# Patient Record
Sex: Male | Born: 1960 | Race: White | Hispanic: No | Marital: Married | Smoking: Former smoker
Health system: Southern US, Community
[De-identification: ages and names within clinical notes are randomized; demographics above are authoritative.]

## PROBLEM LIST (undated history)

## (undated) DIAGNOSIS — J189 Pneumonia, unspecified organism: Secondary | ICD-10-CM

## (undated) DIAGNOSIS — B977 Papillomavirus as the cause of diseases classified elsewhere: Secondary | ICD-10-CM

## (undated) DIAGNOSIS — K219 Gastro-esophageal reflux disease without esophagitis: Secondary | ICD-10-CM

## (undated) DIAGNOSIS — Z9221 Personal history of antineoplastic chemotherapy: Secondary | ICD-10-CM

## (undated) DIAGNOSIS — I341 Nonrheumatic mitral (valve) prolapse: Secondary | ICD-10-CM

## (undated) DIAGNOSIS — J45909 Unspecified asthma, uncomplicated: Secondary | ICD-10-CM

## (undated) DIAGNOSIS — R918 Other nonspecific abnormal finding of lung field: Secondary | ICD-10-CM

## (undated) DIAGNOSIS — Z923 Personal history of irradiation: Secondary | ICD-10-CM

## (undated) DIAGNOSIS — C801 Malignant (primary) neoplasm, unspecified: Secondary | ICD-10-CM

## (undated) DIAGNOSIS — K089 Disorder of teeth and supporting structures, unspecified: Secondary | ICD-10-CM

## (undated) HISTORY — DX: Other nonspecific abnormal finding of lung field: R91.8

## (undated) HISTORY — DX: Personal history of antineoplastic chemotherapy: Z92.21

## (undated) HISTORY — DX: Pneumonia, unspecified organism: J18.9

## (undated) HISTORY — DX: Personal history of irradiation: Z92.3

## (undated) HISTORY — PX: TONSILLECTOMY: SUR1361

## (undated) HISTORY — PX: APPENDECTOMY: SHX54

---

## 2000-12-14 ENCOUNTER — Encounter: Payer: Self-pay | Admitting: Emergency Medicine

## 2000-12-14 ENCOUNTER — Emergency Department (HOSPITAL_COMMUNITY): Admission: EM | Admit: 2000-12-14 | Discharge: 2000-12-14 | Payer: Self-pay | Admitting: Emergency Medicine

## 2002-10-08 ENCOUNTER — Emergency Department (HOSPITAL_COMMUNITY): Admission: EM | Admit: 2002-10-08 | Discharge: 2002-10-08 | Payer: Self-pay | Admitting: Emergency Medicine

## 2003-08-09 ENCOUNTER — Emergency Department (HOSPITAL_COMMUNITY): Admission: EM | Admit: 2003-08-09 | Discharge: 2003-08-09 | Payer: Self-pay | Admitting: Emergency Medicine

## 2005-01-31 ENCOUNTER — Emergency Department (HOSPITAL_COMMUNITY): Admission: EM | Admit: 2005-01-31 | Discharge: 2005-01-31 | Payer: Self-pay | Admitting: Emergency Medicine

## 2005-02-23 ENCOUNTER — Emergency Department (HOSPITAL_COMMUNITY): Admission: EM | Admit: 2005-02-23 | Discharge: 2005-02-23 | Payer: Self-pay | Admitting: Emergency Medicine

## 2008-09-20 ENCOUNTER — Emergency Department (HOSPITAL_COMMUNITY): Admission: EM | Admit: 2008-09-20 | Discharge: 2008-09-20 | Payer: Self-pay | Admitting: Family Medicine

## 2010-06-17 LAB — POCT I-STAT, CHEM 8
BUN: 9 mg/dL (ref 6–23)
Calcium, Ion: 1.25 mmol/L (ref 1.12–1.32)
Chloride: 103 mEq/L (ref 96–112)
Creatinine, Ser: 1.2 mg/dL (ref 0.4–1.5)
Glucose, Bld: 83 mg/dL (ref 70–99)
HCT: 51 % (ref 39.0–52.0)
Hemoglobin: 17.3 g/dL — ABNORMAL HIGH (ref 13.0–17.0)
Potassium: 4.4 mEq/L (ref 3.5–5.1)
Sodium: 139 mEq/L (ref 135–145)
TCO2: 27 mmol/L (ref 0–100)

## 2011-03-12 DIAGNOSIS — C801 Malignant (primary) neoplasm, unspecified: Secondary | ICD-10-CM

## 2011-03-12 HISTORY — DX: Malignant (primary) neoplasm, unspecified: C80.1

## 2011-09-23 ENCOUNTER — Encounter: Payer: Self-pay | Admitting: *Deleted

## 2011-09-23 ENCOUNTER — Telehealth: Payer: Self-pay | Admitting: *Deleted

## 2011-09-23 NOTE — Progress Notes (Signed)
Called referring office for scans and last notes to help with placement for appt

## 2011-09-23 NOTE — Telephone Encounter (Signed)
Spoke with pt and wife regarding appt 10/04/11 at 3:00 with Dr. Bur[facility].  Wife verbalized understanding of appt time and place

## 2011-10-01 ENCOUNTER — Telehealth: Payer: Self-pay | Admitting: *Deleted

## 2011-10-01 NOTE — Telephone Encounter (Signed)
Message copied by Tommie Sams on Tue Oct 01, 2011  4:32 PM ------      Message from: Lonia Farber      Created: Mon Sep 30, 2011 11:26 PM      Regarding: Records       I am seeing Mr. Saxer this Friday.  Could not find any imaging in Epic.  Can you please make sure I have his images from outside ASAP.  He can bring the disk for his appointment.  If images are not available he should be rescheduled whenever we can get them.  Thanks. KZ

## 2011-10-01 NOTE — Telephone Encounter (Signed)
I called referring physician and was advised pt had CT chest w/ contrast at [facility] on 09/20/11. I called [facility] and they are going to put pt CT chest on a disc and mail this over to our office prior to pt appt on Friday. Will forward to Dr. Herma Carson so he aware

## 2011-10-03 ENCOUNTER — Encounter: Payer: Self-pay | Admitting: Pulmonary Disease

## 2011-10-04 ENCOUNTER — Ambulatory Visit (INDEPENDENT_AMBULATORY_CARE_PROVIDER_SITE_OTHER): Payer: BC Managed Care – PPO | Admitting: Pulmonary Disease

## 2011-10-04 ENCOUNTER — Encounter: Payer: Self-pay | Admitting: Pulmonary Disease

## 2011-10-04 VITALS — BP 108/84 | HR 81 | Temp 98.1°F | Ht 70.0 in | Wt 220.4 lb

## 2011-10-04 DIAGNOSIS — R918 Other nonspecific abnormal finding of lung field: Secondary | ICD-10-CM

## 2011-10-04 DIAGNOSIS — R222 Localized swelling, mass and lump, trunk: Secondary | ICD-10-CM

## 2011-10-04 MED ORDER — BENZONATATE 200 MG PO CAPS: 200.0000 mg | ORAL_CAPSULE | Freq: Three times a day (TID) | ORAL | Status: AC | PRN

## 2011-10-04 MED ORDER — OXYCODONE-ACETAMINOPHEN 10-325 MG PO TABS: 1.0000 | ORAL_TABLET | Freq: Four times a day (QID) | ORAL | Status: AC | PRN

## 2011-10-04 NOTE — Patient Instructions (Addendum)
Bronchoscopy / EBUS (already scheduled) PET/CT scan Benzonatate refill Percocet refill

## 2011-10-04 NOTE — Progress Notes (Signed)
Name: Roger Mckinney MRN: [id number] DOB: 06/07/1960  Referring Physician:  Kimberly Millsaps, NP Reason for Consultation:  Lung mass   INTERVENTIONAL PULMONOLOGY CONSULTATION NOTE   History of Present Illness: 51 year old active smoker with history of rib fractures after a paroxysm of cough several month ago.  Another rib fracture after a paroxysm of cough about one week ago same.  Imaging revealed lung mass.  Reports chronic nonproductive cough.  There is no hemoptysis.  Lost 12 lb in since few month ago. Reports severe chest wall pain related to rib fractures.  Past Medical History  Diagnosis Date  . PNA (pneumonia)   . Lung mass    No past surgical history on file.  Prior to Admission medications   Medication Sig Start Date End Date Taking? Authorizing Provider  benzonatate (TESSALON) 200 MG capsule Take 200 mg by mouth 3 (three) times daily as needed.    Historical Provider, MD  Oxycodone HCl 10 MG TABS Take 10 mg by mouth every 6 (six) hours as needed.    Historical Provider, MD   Allergies Allergies  Allergen Reactions  . 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 Positive for weight loss. HENT: Negative for hearing loss, ear pain, nosebleeds, congestion, sore throat, neck pain, tinnitus and ear discharge.   Eyes: Negative for blurred vision, double vision, photophobia, pain, discharge and redness.  Respiratory: Negative for hemoptysis, sputum production, shortness of breath, wheezing and stridor.  Positive for nonproductive cough. Cardiovascular: Negative for chest pain, palpitations, orthopnea, claudication, leg swelling and PND.    Gastrointestinal: Negative for heartburn, nausea, vomiting, abdominal pain, diarrhea, constipation, blood in stool and melena.  Genitourinary: Negative for dysuria, urgency, frequency, hematuria and flank pain.  Musculoskeletal: Negative for myalgias, back pain, joint pain and falls. Skin: Negative for itching and rash. Positive for chest wall pain. Neurological: Negative for dizziness, tingling, tremors, sensory change, speech change, focal weakness, seizures, loss of consciousness, weakness and headaches.  Endo/Heme/Allergies: Negative for environmental allergies and polydipsia. Does not bruise/bleed easily.   Vital Signs: There were no vitals filed for this visit.  Physical Examination: General:  No acute distress, anxious Neuro:  Alert, oriented, nonfocal HEENT:  Pink conjunctivae, moist mucus membranes, poor dentition Neck:  No lymphadenopathy Cardiovascular:  RRR, no murmurs Lungs:  Bilateral air entry, no wheezes, rhonchi, rales. Tenderness to chest wall palpation. Abdomen:  Soft, nontender, nondistended Musculoskeletal:  No clubbing, cyanosis or edema. Digital clubbing. Skin: No rash   Labs: No results found for this basename: HGB:3,HCT:3,WBC:3,PLT:3,NA:5,K:2,CL:5,CO2:5,GLUCOSE:5,BUN:5,CREATININE:5,CALCIUM:5,MG:5,PHOS:5,INR:5,APTT:5 in the last 168 hours  Chest CT(images reviewed):  09/20/2011 (outside) >>> Large left perihilar mass.  Small mediastinal lymph nodes. Babasilar opacification which may represent infiltrates / atelectasis.  Posterior right 8th rib fracture.   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 Dwana Garin, M.D., F.C.C.P. Interventional Pulmonology [facility] Cell: [phone] Pager: [phone]  10/04/2011, 1:44 PM   

## 2011-10-07 ENCOUNTER — Encounter (HOSPITAL_COMMUNITY): Payer: Self-pay | Admitting: Respiratory Therapy

## 2011-10-08 ENCOUNTER — Encounter (HOSPITAL_COMMUNITY): Payer: Self-pay

## 2011-10-08 ENCOUNTER — Encounter (HOSPITAL_COMMUNITY)
Admission: RE | Admit: 2011-10-08 | Discharge: 2011-10-08 | Disposition: A | Discharge status: Home / Self Care | Payer: BC Managed Care – PPO | Source: Ambulatory Visit | Attending: Pulmonary Disease | Admitting: Pulmonary Disease

## 2011-10-08 HISTORY — DX: Gastro-esophageal reflux disease without esophagitis: K21.9

## 2011-10-08 HISTORY — DX: Unspecified asthma, uncomplicated: J45.909

## 2011-10-08 HISTORY — DX: Nonrheumatic mitral (valve) prolapse: I34.1

## 2011-10-08 HISTORY — DX: Papillomavirus as the cause of diseases classified elsewhere: B97.7

## 2011-10-08 LAB — CBC
HCT: 44.3 % (ref 39.0–52.0)
MCHC: 33.9 g/dL (ref 30.0–36.0)
MCV: 89 fL (ref 78.0–100.0)
Platelets: 234 10*3/uL (ref 150–400)
RDW: 14.1 % (ref 11.5–15.5)
WBC: 9.7 10*3/uL (ref 4.0–10.5)

## 2011-10-08 LAB — COMPREHENSIVE METABOLIC PANEL
AST: 16 U/L (ref 0–37)
Albumin: 3.6 g/dL (ref 3.5–5.2)
BUN: 12 mg/dL (ref 6–23)
Calcium: 9.7 mg/dL (ref 8.4–10.5)
Chloride: 101 mEq/L (ref 96–112)
Creatinine, Ser: 1 mg/dL (ref 0.50–1.35)
Total Protein: 7.6 g/dL (ref 6.0–8.3)

## 2011-10-08 LAB — PROTIME-INR
INR: 1.01 (ref 0.00–1.49)
Prothrombin Time: 13.5 seconds (ref 11.6–15.2)

## 2011-10-08 LAB — SURGICAL PCR SCREEN
MRSA, PCR: NEGATIVE
Staphylococcus aureus: NEGATIVE

## 2011-10-08 NOTE — Pre-Procedure Instructions (Signed)
20 Roger Mckinney  10/08/2011   Your procedure is scheduled on:  Wednesday October 09, 2011  Report to [facility] at 10:30 AM.  Call this number if you have problems the morning of surgery: [phone]   Remember:   Do not eat food or drink :After Midnight.      Take these medicines the morning of surgery with A SIP OF WATER: tessalon, percocet   Do not wear jewelry, make-up or nail polish.  Do not wear lotions, powders, or perfumes. You may wear deodorant.  Do not shave 48 hours prior to surgery. Men may shave face and neck.  Do not bring valuables to the hospital.  Contacts, dentures or bridgework may not be worn into surgery.  Leave suitcase in the car. After surgery it may be brought to your room.  For patients admitted to the hospital, checkout time is 11:00 AM the day of discharge.   Patients discharged the day of surgery will not be allowed to drive home.  Name and phone number of your driver: family / friend  Special Instructions: CHG Shower Use Special Wash: 1/2 bottle night before surgery and 1/2 bottle morning of surgery.   Please read over the following fact sheets that you were given: Pain Booklet, Coughing and Deep Breathing, MRSA Information and Surgical Site Infection Prevention

## 2011-10-08 NOTE — Pre-Procedure Instructions (Deleted)
20 Roger Mckinney  10/08/2011   Your procedure is scheduled on:  Wednesday October 09, 2011  Report to [facility] at 11:00 AM.  Call this number if you have problems the morning of surgery: [phone]   Remember:   Do not eat food or drink :After Midnight.      Take these medicines the morning of surgery with A SIP OF WATER: percocet   Do not wear jewelry, make-up or nail polish.  Do not wear lotions, powders, or perfumes. You may wear deodorant.  Do not shave 48 hours prior to surgery. Men may shave face and neck.  Do not bring valuables to the hospital.  Contacts, dentures or bridgework may not be worn into surgery.  Leave suitcase in the car. After surgery it may be brought to your room.  For patients admitted to the hospital, checkout time is 11:00 AM the day of discharge.   Patients discharged the day of surgery will not be allowed to drive home.  Name and phone number of your driver:  Family/ friend  Special Instructions: CHG Shower Use Special Wash: 1/2 bottle night before surgery and 1/2 bottle morning of surgery.   Please read over the following fact sheets that you were given: Pain Booklet, Coughing and Deep Breathing, MRSA Information and Surgical Site Infection Prevention

## 2011-10-09 ENCOUNTER — Encounter (HOSPITAL_COMMUNITY): Payer: Self-pay | Admitting: Certified Registered Nurse Anesthetist

## 2011-10-09 ENCOUNTER — Ambulatory Visit (HOSPITAL_COMMUNITY): Payer: BC Managed Care – PPO | Admitting: Certified Registered Nurse Anesthetist

## 2011-10-09 ENCOUNTER — Ambulatory Visit (HOSPITAL_COMMUNITY)
Admission: RE | Admit: 2011-10-09 | Discharge: 2011-10-09 | Disposition: A | Discharge status: Home / Self Care | Payer: BC Managed Care – PPO | Source: Ambulatory Visit | Attending: Pulmonary Disease | Admitting: Pulmonary Disease

## 2011-10-09 ENCOUNTER — Encounter (HOSPITAL_COMMUNITY)
Admission: RE | Disposition: A | Discharge status: Home / Self Care | Payer: Self-pay | Source: Ambulatory Visit | Attending: Pulmonary Disease

## 2011-10-09 DIAGNOSIS — C3490 Malignant neoplasm of unspecified part of unspecified bronchus or lung: Secondary | ICD-10-CM | POA: Insufficient documentation

## 2011-10-09 DIAGNOSIS — R599 Enlarged lymph nodes, unspecified: Secondary | ICD-10-CM | POA: Insufficient documentation

## 2011-10-09 DIAGNOSIS — R918 Other nonspecific abnormal finding of lung field: Secondary | ICD-10-CM

## 2011-10-09 DIAGNOSIS — F17200 Nicotine dependence, unspecified, uncomplicated: Secondary | ICD-10-CM | POA: Insufficient documentation

## 2011-10-09 DIAGNOSIS — R222 Localized swelling, mass and lump, trunk: Secondary | ICD-10-CM

## 2011-10-09 DIAGNOSIS — R590 Localized enlarged lymph nodes: Secondary | ICD-10-CM

## 2011-10-09 SURGERY — BRONCHOSCOPY, WITH EBUS
Anesthesia: General

## 2011-10-09 MED ORDER — ONDANSETRON HCL 4 MG/2ML IJ SOLN
INTRAMUSCULAR | Status: DC | PRN
  Administered 2011-10-09: 4 mg via INTRAVENOUS

## 2011-10-09 MED ORDER — MIDAZOLAM HCL 5 MG/5ML IJ SOLN
INTRAMUSCULAR | Status: DC | PRN
  Administered 2011-10-09: 2 mg via INTRAVENOUS

## 2011-10-09 MED ORDER — GLYCOPYRROLATE 0.2 MG/ML IJ SOLN
INTRAMUSCULAR | Status: DC | PRN
  Administered 2011-10-09: .8 mg via INTRAVENOUS

## 2011-10-09 MED ORDER — 0.9 % SODIUM CHLORIDE (POUR BTL) OPTIME
TOPICAL | Status: DC | PRN
  Administered 2011-10-09: 1000 mL

## 2011-10-09 MED ORDER — FENTANYL CITRATE 0.05 MG/ML IJ SOLN
INTRAMUSCULAR | Status: DC | PRN
  Administered 2011-10-09 (×4): 50 ug via INTRAVENOUS

## 2011-10-09 MED ORDER — LACTATED RINGERS IV SOLN
INTRAVENOUS | Status: DC | PRN
  Administered 2011-10-09 (×2): via INTRAVENOUS

## 2011-10-09 MED ORDER — LIDOCAINE HCL (CARDIAC) 20 MG/ML IV SOLN
INTRAVENOUS | Status: DC | PRN
  Administered 2011-10-09: 80 mg via INTRAVENOUS

## 2011-10-09 MED ORDER — PROPOFOL 10 MG/ML IV EMUL
INTRAVENOUS | Status: DC | PRN
  Administered 2011-10-09: 50 mg via INTRAVENOUS
  Administered 2011-10-09: 250 mg via INTRAVENOUS

## 2011-10-09 MED ORDER — LACTATED RINGERS IV SOLN
INTRAVENOUS | Status: DC
  Administered 2011-10-09: 12:00:00 via INTRAVENOUS

## 2011-10-09 MED ORDER — ROCURONIUM BROMIDE 100 MG/10ML IV SOLN
INTRAVENOUS | Status: DC | PRN
  Administered 2011-10-09: 40 mg via INTRAVENOUS

## 2011-10-09 MED ORDER — LIDOCAINE HCL 4 % MT SOLN
OROMUCOSAL | Status: DC | PRN
  Administered 2011-10-09: 4 mL via TOPICAL

## 2011-10-09 MED ORDER — NEOSTIGMINE METHYLSULFATE 1 MG/ML IJ SOLN
INTRAMUSCULAR | Status: DC | PRN
  Administered 2011-10-09: 5 mg via INTRAVENOUS

## 2011-10-09 SURGICAL SUPPLY — 25 items
BRUSH CYTOL CELLEBRITY 1.5X140 (MISCELLANEOUS) IMPLANT
CANISTER SUCTION 2500CC (MISCELLANEOUS) ×2 IMPLANT
CLOTH BEACON ORANGE TIMEOUT ST (SAFETY) ×2 IMPLANT
CONT SPEC 4OZ CLIKSEAL STRL BL (MISCELLANEOUS) ×2 IMPLANT
COVER TABLE BACK 60X90 (DRAPES) ×2 IMPLANT
FORCEPS BIOP RJ4 1.8 (CUTTING FORCEPS) IMPLANT
GLOVE BIOGEL PI IND STRL 6.5 (GLOVE) IMPLANT
GLOVE BIOGEL PI INDICATOR 6.5 (GLOVE) ×1
GLOVE SURG SIGNA 7.5 PF LTX (GLOVE) ×2 IMPLANT
KIT ROOM TURNOVER OR (KITS) ×2 IMPLANT
MARKER SKIN DUAL TIP RULER LAB (MISCELLANEOUS) ×2 IMPLANT
NDL BIOPSY TRANSBRONCH 21G (NEEDLE) IMPLANT
NEEDLE BIOPSY TRANSBRONCH 21G (NEEDLE) IMPLANT
NEEDLE SYS SONOTIP II EBUSTBNA (NEEDLE) ×2 IMPLANT
NS IRRIG 1000ML POUR BTL (IV SOLUTION) ×2 IMPLANT
OIL SILICONE PENTAX (PARTS (SERVICE/REPAIRS)) IMPLANT
PAD ARMBOARD 7.5X6 YLW CONV (MISCELLANEOUS) ×4 IMPLANT
SPONGE GAUZE 4X4 12PLY (GAUZE/BANDAGES/DRESSINGS) IMPLANT
SYR 20CC LL (SYRINGE) ×2 IMPLANT
SYR 20ML ECCENTRIC (SYRINGE) ×4 IMPLANT
SYR 5ML LUER SLIP (SYRINGE) ×4 IMPLANT
SYR TOOMEY 50ML (SYRINGE) ×4 IMPLANT
TOWEL OR 17X24 6PK STRL BLUE (TOWEL DISPOSABLE) ×2 IMPLANT
TRAP SPECIMEN MUCOUS 40CC (MISCELLANEOUS) ×2 IMPLANT
TUBE CONNECTING 12X1/4 (SUCTIONS) ×2 IMPLANT

## 2011-10-09 NOTE — Anesthesia Postprocedure Evaluation (Signed)
  Anesthesia Post-op Note  Patient: Roger Mckinney  Procedure(s) Performed: Procedure(s) (LRB): VIDEO BRONCHOSCOPY WITH ENDOBRONCHIAL ULTRASOUND (N/A)  Patient Location: PACU  Anesthesia Type: General  Level of Consciousness: awake  Airway and Oxygen Therapy: Patient Spontanous Breathing  Post-op Pain: mild  Post-op Assessment: Post-op Vital signs reviewed  Post-op Vital Signs: Reviewed  Complications: No apparent anesthesia complications

## 2011-10-09 NOTE — Preoperative (Signed)
Beta Blockers   Reason not to administer Beta Blockers:Not Applicable 

## 2011-10-09 NOTE — Interval H&P Note (Signed)
Records reviewed.  Patient examined.  No changes since last seen.  Procedure explained.  Questions answered.  Consent obtained.

## 2011-10-09 NOTE — H&P (View-Only) (Signed)
Name: Roger Mckinney MRN: [id number] DOB: May 15, 1960  Referring Physician:  Marva Panda, NP Reason for Consultation:  Lung mass   INTERVENTIONAL PULMONOLOGY CONSULTATION NOTE   History of Present Illness: 51 year old active smoker with history of rib fractures after a paroxysm of cough several month ago.  Another rib fracture after a paroxysm of cough about one week ago same.  Imaging revealed lung mass.  Reports chronic nonproductive cough.  There is no hemoptysis.  Lost 12 lb in since few month ago. Reports severe chest wall pain related to rib fractures.  Past Medical History  Diagnosis Date  . PNA (pneumonia)   . Lung mass    No past surgical history on file.  Prior to Admission medications   Medication Sig Start Date End Date Taking? Authorizing Provider  benzonatate (TESSALON) 200 MG capsule Take 200 mg by mouth 3 (three) times daily as needed.    Historical Provider, MD  Oxycodone HCl 10 MG TABS Take 10 mg by mouth every 6 (six) hours as needed.    Historical Provider, MD   Allergies Allergies  Allergen Reactions  . 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 Positive for weight loss. HENT: Negative for hearing loss, ear pain, nosebleeds, congestion, sore throat, neck pain, tinnitus and ear discharge.   Eyes: Negative for blurred vision, double vision, photophobia, pain, discharge and redness.  Respiratory: Negative for hemoptysis, sputum production, shortness of breath, wheezing and stridor.  Positive for nonproductive cough. Cardiovascular: Negative for chest pain, palpitations, orthopnea, claudication, leg swelling and PND.    Gastrointestinal: Negative for heartburn, nausea, vomiting, abdominal pain, diarrhea, constipation, blood in stool and melena.  Genitourinary: Negative for dysuria, urgency, frequency, hematuria and flank pain.  Musculoskeletal: Negative for myalgias, back pain, joint pain and falls. Skin: Negative for itching and rash. Positive for chest wall pain. Neurological: Negative for dizziness, tingling, tremors, sensory change, speech change, focal weakness, seizures, loss of consciousness, weakness and headaches.  Endo/Heme/Allergies: Negative for environmental allergies and polydipsia. Does not bruise/bleed easily.   Vital Signs: There were no vitals filed for this visit.  Physical Examination: General:  No acute distress, anxious Neuro:  Alert, oriented, nonfocal HEENT:  Pink conjunctivae, moist mucus membranes, poor dentition Neck:  No lymphadenopathy Cardiovascular:  RRR, no murmurs Lungs:  Bilateral air entry, no wheezes, rhonchi, rales. Tenderness to chest wall palpation. Abdomen:  Soft, nontender, nondistended Musculoskeletal:  No clubbing, cyanosis or edema. Digital clubbing. Skin: No rash   Labs: No results found for this basename: HGB:3,HCT:3,WBC:3,PLT:3,NA:5,K:2,CL:5,CO2:5,GLUCOSE:5,BUN:5,CREATININE:5,CALCIUM:5,MG:5,PHOS:5,INR:5,APTT:5 in the last 168 hours  Chest CT(images reviewed):  09/20/2011 (outside) >>> Large left perihilar mass.  Small mediastinal lymph nodes. Babasilar opacification which may represent infiltrates / atelectasis.  Posterior right 8th rib fracture.   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 Interventional Pulmonology [facility] Cell: [phone] Pager: [phone]  10/04/2011, 1:44 PM

## 2011-10-09 NOTE — Anesthesia Procedure Notes (Signed)
Procedure Name: Intubation Date/Time: 10/09/2011 12:37 PM Performed by: Margaree Mackintosh Pre-anesthesia Checklist: Patient identified, Timeout performed, Emergency Drugs available, Suction available and Patient being monitored Patient Re-evaluated:Patient Re-evaluated prior to inductionOxygen Delivery Method: Circle system utilized Preoxygenation: Pre-oxygenation with 100% oxygen Intubation Type: IV induction Ventilation: Mask ventilation without difficulty and Oral airway inserted - appropriate to patient size Laryngoscope Size: Mac and 4 Grade View: Grade II Tube type: Oral Tube size: 8.5 mm Number of attempts: 1 Airway Equipment and Method: Stylet and LTA kit utilized Placement Confirmation: ETT inserted through vocal cords under direct vision,  positive ETCO2 and breath sounds checked- equal and bilateral Secured at: 22 cm Tube secured with: Tape Dental Injury: Teeth and Oropharynx as per pre-operative assessment

## 2011-10-09 NOTE — Transfer of Care (Signed)
Immediate Anesthesia Transfer of Care Note  Patient: Roger Mckinney  Procedure(s) Performed: Procedure(s) (LRB): VIDEO BRONCHOSCOPY WITH ENDOBRONCHIAL ULTRASOUND (N/A)  Patient Location: PACU  Anesthesia Type: General  Level of Consciousness: awake, alert  and oriented  Airway & Oxygen Therapy: Patient Spontanous Breathing and Patient connected to nasal cannula oxygen  Post-op Assessment: Report given to PACU RN and Post -op Vital signs reviewed and stable  Post vital signs: Reviewed and stable  Complications: No apparent anesthesia complications

## 2011-10-09 NOTE — Anesthesia Preprocedure Evaluation (Addendum)
Anesthesia Evaluation  Patient identified by MRN, date of birth, ID band Patient awake    Reviewed: Allergy & Precautions, H&P , NPO status , Patient's Chart, lab work & pertinent test results  Airway Mallampati: II      Dental   Pulmonary asthma , pneumonia -, resolved, Current Smoker,  Asthma as a child breath sounds clear to auscultation        Cardiovascular negative cardio ROS  Rhythm:Regular Rate:Normal     Neuro/Psych negative neurological ROS  negative psych ROS   GI/Hepatic negative GI ROS, Neg liver ROS,   Endo/Other  negative endocrine ROS  Renal/GU negative Renal ROS     Musculoskeletal   Abdominal   Peds  Hematology negative hematology ROS (+)   Anesthesia Other Findings   Reproductive/Obstetrics                          Anesthesia Physical Anesthesia Plan  ASA: III  Anesthesia Plan: General   Post-op Pain Management:    Induction: Intravenous  Airway Management Planned: Oral ETT  Additional Equipment:   Intra-op Plan:   Post-operative Plan: Extubation in OR  Informed Consent: I have reviewed the patients History and Physical, chart, labs and discussed the procedure including the risks, benefits and alternatives for the proposed anesthesia with the patient or authorized representative who has indicated his/her understanding and acceptance.   Dental advisory given  Plan Discussed with: CRNA, Anesthesiologist and Surgeon  Anesthesia Plan Comments:        Anesthesia Quick Evaluation

## 2011-10-11 ENCOUNTER — Telehealth: Payer: Self-pay | Admitting: Pulmonary Disease

## 2011-10-11 ENCOUNTER — Encounter (HOSPITAL_COMMUNITY)
Admission: RE | Admit: 2011-10-11 | Discharge: 2011-10-11 | Disposition: A | Discharge status: Home / Self Care | Payer: BC Managed Care – PPO | Source: Ambulatory Visit | Attending: Pulmonary Disease | Admitting: Pulmonary Disease

## 2011-10-11 DIAGNOSIS — R599 Enlarged lymph nodes, unspecified: Secondary | ICD-10-CM | POA: Insufficient documentation

## 2011-10-11 DIAGNOSIS — I712 Thoracic aortic aneurysm, without rupture, unspecified: Secondary | ICD-10-CM | POA: Insufficient documentation

## 2011-10-11 DIAGNOSIS — X58XXXA Exposure to other specified factors, initial encounter: Secondary | ICD-10-CM | POA: Insufficient documentation

## 2011-10-11 DIAGNOSIS — K4090 Unilateral inguinal hernia, without obstruction or gangrene, not specified as recurrent: Secondary | ICD-10-CM | POA: Insufficient documentation

## 2011-10-11 DIAGNOSIS — S2249XA Multiple fractures of ribs, unspecified side, initial encounter for closed fracture: Secondary | ICD-10-CM | POA: Insufficient documentation

## 2011-10-11 DIAGNOSIS — R222 Localized swelling, mass and lump, trunk: Secondary | ICD-10-CM | POA: Insufficient documentation

## 2011-10-11 DIAGNOSIS — R220 Localized swelling, mass and lump, head: Secondary | ICD-10-CM | POA: Insufficient documentation

## 2011-10-11 DIAGNOSIS — R911 Solitary pulmonary nodule: Secondary | ICD-10-CM | POA: Insufficient documentation

## 2011-10-11 DIAGNOSIS — J438 Other emphysema: Secondary | ICD-10-CM | POA: Insufficient documentation

## 2011-10-11 DIAGNOSIS — R918 Other nonspecific abnormal finding of lung field: Secondary | ICD-10-CM

## 2011-10-11 DIAGNOSIS — J9819 Other pulmonary collapse: Secondary | ICD-10-CM | POA: Insufficient documentation

## 2011-10-11 LAB — GLUCOSE, CAPILLARY: Glucose-Capillary: 118 mg/dL — ABNORMAL HIGH (ref 70–99)

## 2011-10-11 MED ORDER — FLUDEOXYGLUCOSE F - 18 (FDG) INJECTION
19.0000 | Freq: Once | INTRAVENOUS | Status: AC | PRN
  Administered 2011-10-11: 19 via INTRAVENOUS

## 2011-10-12 ENCOUNTER — Other Ambulatory Visit: Payer: Self-pay | Admitting: Pulmonary Disease

## 2011-10-12 DIAGNOSIS — R918 Other nonspecific abnormal finding of lung field: Secondary | ICD-10-CM

## 2011-10-15 DIAGNOSIS — R590 Localized enlarged lymph nodes: Secondary | ICD-10-CM

## 2011-10-15 DIAGNOSIS — R918 Other nonspecific abnormal finding of lung field: Secondary | ICD-10-CM

## 2011-10-15 NOTE — Telephone Encounter (Signed)
Patient contacted.  Results discussed.  Oncology appointment pending.

## 2011-10-15 NOTE — Op Note (Addendum)
Name:  Roger Mckinney MRN:  [id number] DOB:  [birth year]  OP NOTE  Procedure(s): Flexible bronchoscopy [phone]) Endobronchial ultrasound (19147) Transbronchial needle aspiration (82956) of the left hilar mass Transbronchial needle aspiration, additional lobe (21308) of station 7 LN  Indications:  Lung mass, hilar lymphadenopathy.  Consent:  Procedure, benefits, risks and alternatives discussed.  Questions answered.  Consent obtained.  Anesthesia:  General endotracheal.  Procedure summary:  Appropriate equipment was assembled.  The patient was brought to the operating room and identified as Roger Mckinney.  Safety timeout was performed. The patient was placed supine on the operating table, airway established and general anesthesia administered by Anesthesia team.   After the appropriate level of anesthesia was assured, flexible video bronchoscope was lubricated and inserted through the endotracheal tube.    Airway examination was performed bilaterally to subsegmental level.  Minimal clear secretions were noted, mucosa appeared normal and no endobronchial lesions were identified.  Endobronchial ultrasound video bronchoscope was then lubricated and inserted through the endotracheal tube. Surveillance of the mediastinal and bilateral hilar lymph node stations was performed.  Left hilar mass could be clearly visualized.  A conglomerate of enlarged subcarinal lymph nodes was noted.  Right hilar lymph nodes appear normal in size.  Paratracheal nodes on either side could not be visualized.     Endobronchial ultrasound guided transbronchial needle aspiration of the left hilar mass was performed and sent for intraoperative pathology assessment.  The needle was then changed and station 7 LN were sampled under sonographic guidance.  After that EBUS bronchoscope was withdrawn.  Flexible video bronchoscope was used again to assure hemostasis.  The patient was extubated in operating room and transferred to  PACU.  Specimens sent: Left hilar mass and station 7 LN samples for pathology assessment.  Complications:  No immediate complications were noted.  Hemodynamic parameters and oxygenation remained stable throughout the procedure.  Estimated blood loss:  Less then 5 mL.  Orlean Bradford, M.D. Pulmonary and Critical Care Medicine [facility] Cell: [phone]  10/15/2011, 5:43 AM

## 2011-10-16 ENCOUNTER — Encounter: Payer: Self-pay | Admitting: Internal Medicine

## 2011-10-16 NOTE — Progress Notes (Signed)
I did speak with patient wife Dalessandro Baldyga and she had some questions about medicaid.I told her that it would be better if she [place] and [place] the paper work,and I told her what to bring when she goes down there.I also gave her the address here in [place] on [address].

## 2011-10-17 ENCOUNTER — Encounter: Payer: Self-pay | Admitting: Internal Medicine

## 2011-10-17 ENCOUNTER — Ambulatory Visit (HOSPITAL_BASED_OUTPATIENT_CLINIC_OR_DEPARTMENT_OTHER): Payer: BC Managed Care – PPO | Admitting: Internal Medicine

## 2011-10-17 ENCOUNTER — Ambulatory Visit: Payer: BC Managed Care – PPO | Admitting: Radiation Oncology

## 2011-10-17 ENCOUNTER — Telehealth: Payer: Self-pay | Admitting: *Deleted

## 2011-10-17 VITALS — BP 125/84 | HR 88 | Temp 97.4°F | Resp 18 | Ht 70.0 in | Wt 221.0 lb

## 2011-10-17 DIAGNOSIS — C3430 Malignant neoplasm of lower lobe, unspecified bronchus or lung: Secondary | ICD-10-CM

## 2011-10-17 DIAGNOSIS — R599 Enlarged lymph nodes, unspecified: Secondary | ICD-10-CM

## 2011-10-17 DIAGNOSIS — C3490 Malignant neoplasm of unspecified part of unspecified bronchus or lung: Secondary | ICD-10-CM | POA: Insufficient documentation

## 2011-10-17 DIAGNOSIS — R05 Cough: Secondary | ICD-10-CM

## 2011-10-17 NOTE — Telephone Encounter (Signed)
Spoke with pt.'s wife regarding appt time and place.  She verbalized understanding of time and place.

## 2011-10-17 NOTE — Progress Notes (Signed)
[facility] [phone]   [phone]  CONSULT NOTE  REASON FOR CONSULTATION:  51 years old white male diagnosed with lung.  HPI Roger Mckinney is a 51 y.o. male was no significant past medical history except for history of mitral valve prolapse, HPV and long history of smoking. The patient mentions that 6 weeks ago he started having dry cough that was violent at time and he broke a rib at that time. He was seen by his primary care physician and was getting a week off work to recover. The patient continues to have coughing spells and he broke another rib. Chest x-ray followed by CT scan of the chest were performed at. [facility] on 09/20/2011 and it showed large left perihilar mass measuring 7.0 x 4.0 x 5.0 CM in addition to small mediastinal lymph node and posterior right eighth rib fracture. The patient was referred to Dr.Zubelevitskiy and he underwent flexible bronchoscopy, endobronchial ultrasound, transbronchial needle aspiration of the left hilar mass, transbronchial needle aspiration of the station 7 lymph node. The final pathology from the left hilar mass showed malignant cells consistent with non-small cell carcinoma favoring adenocarcinoma. The fine needle aspiration of the subcarinal lymph node was negative for malignancy. Dr. Marin Shutter ordered a PET scan and this was performed on 10/11/2011 and it showed Superior segment left lower lobe lung mass is most consistent with a primary bronchogenic carcinoma. Mildly hypermetabolic ipsilateral mediastinal adenopathy is most consistent with nodal metastasis. Presuming non-small cell histology, by imaging T2bN2M0 or stage IIIA. Right-sided rib fractures which are favored to be post-traumatic and demonstrate low level hypermetabolism. No evidence of extrathoracic disease. Hypermetabolic left parotid nodule. Suspicious for a benign or malignant primary neoplasm. Hypermetabolism corresponding to the left sided pterygoid  plates. Favored to be physiologic. Recommend attention on follow-Up. Mild ascending aortic aneurysm, 4.9 cm. Probable age advanced coronary artery atherosclerosis. correlate with risk factors. A 3 mm right upper lobe lung nodule which warrants followup attention. The patient was referred to me today for further evaluation and recommendation regarding treatment of his condition. He continues to complain of nonproductive cough. He is currently on Tessalon pearls as well as inhaler. He also takes Percocet for pain. The patient also continues to complain of chest tightness and shortness breath with exertion. He has around 20 pounds of weight loss over the last 2 months secondary to lack of appetite. He denied having any significant headache or visual changes.  His family history significant for his father who died from lung cancer at age 43, mother had throat cancer and 2 sisters with breast cancer. The patient is married and has no children, he was accompanied today by his wife Roger Mckinney. He works in [employer], [employer].  He has a history of smoking one pack and half pack per day for around 30 years unfortunately he continues to smoke around having 1/2 PPD. No history of alcohol or drug abuse .   @SFHPI @  Past Medical History  Diagnosis Date  . PNA (pneumonia)   . Lung mass   . Mitral valve prolapse   . Asthma     as a child  . GERD (gastroesophageal reflux disease)     hx of   . HPV (human papilloma virus) infection     hx of    Past Surgical History  Procedure Date  . Appendectomy   . Tonsillectomy     Family History  Problem Relation Age of Onset  . Diabetes Mother   .  Breast cancer Sister   . Breast cancer Sister   . Lung cancer Father     was a smoker  . Colon cancer Paternal Grandmother   . Lung cancer Paternal Grandmother     never a smoker  . Lupus Sister     Social History History  Substance Use Topics  . Smoking status: Current Everyday Smoker -- 1.0 packs/day  for 30 years    Types: Cigarettes  . Smokeless tobacco: Never Used  . Alcohol Use: No    Allergies  Allergen Reactions  . Penicillins Other (See Comments)    Unknown....as an infant    Current Outpatient Prescriptions  Medication Sig Dispense Refill  . aspirin EC 81 MG tablet Take 81 mg by mouth daily.      . benzonatate (TESSALON) 200 MG capsule Take 200 mg by mouth 3 (three) times daily as needed.      . Multiple Vitamin (MULTIVITAMIN WITH MINERALS) TABS Take 1 tablet by mouth daily.      Marland Kitchen oxyCODONE-acetaminophen (PERCOCET) 10-325 MG per tablet Take 1 tablet by mouth every 6 (six) hours as needed.        Review of Systems  A comprehensive review of systems was negative except for: Constitutional: positive for fatigue and weight loss Respiratory: positive for cough, dyspnea on exertion and pleurisy/chest pain Musculoskeletal: positive for bone pain  Physical Exam  ZOX:WRUEA, healthy, no distress, well nourished and well developed SKIN: skin color, texture, turgor are normal HEAD: Normocephalic, No masses, lesions, tenderness or abnormalities EYES: normal EARS: External ears normal OROPHARYNX:no exudate and no erythema  NECK: supple, no adenopathy LYMPH:  no palpable lymphadenopathy, no hepatosplenomegaly LUNGS: clear to auscultation  HEART: regular rate & rhythm, no murmurs and no gallops ABDOMEN:abdomen soft, non-tender, normal bowel sounds and no masses or organomegaly BACK: Back symmetric, no curvature. EXTREMITIES:no joint deformities, effusion, or inflammation, no edema, no skin discoloration, no clubbing, no cyanosis  NEURO: alert & oriented x 3 with fluent speech, no focal motor/sensory deficits  PERFORMANCE STATUS: ECOG 1  LABORATORY DATA: Lab Results  Component Value Date   WBC 9.7 10/08/2011   HGB 15.0 10/08/2011   HCT 44.3 10/08/2011   MCV 89.0 10/08/2011   PLT 234 10/08/2011      Chemistry      Component Value Date/Time   NA 138 10/08/2011 1346   K  4.9 10/08/2011 1346   CL 101 10/08/2011 1346   CO2 27 10/08/2011 1346   BUN 12 10/08/2011 1346   CREATININE 1.00 10/08/2011 1346      Component Value Date/Time   CALCIUM 9.7 10/08/2011 1346   ALKPHOS 102 10/08/2011 1346   AST 16 10/08/2011 1346   ALT 11 10/08/2011 1346   BILITOT 0.4 10/08/2011 1346       RADIOGRAPHIC STUDIES: Nm Pet Image Initial (pi) Skull Base To Thigh  10/11/2011  *RADIOLOGY REPORT*  Clinical Data: Initial treatment strategy for staging of lung mass.  NUCLEAR MEDICINE PET CT SKULL BASE TO THIGH  Technique:  Technique:  19.0 mCi F-18 FDG was injected intravenously. CT data was obtained and used for attenuation correction and anatomic localization only.  (This was not acquired as a diagnostic CT examination.) Additional exam technical data entered on technologist worksheet.  Comparison: Plain film chest of 01/31/2005.  Outside CT dated 09/28/1938 not submitted. The clinic note of 10/09/2011 describes the outside report as a "left perihilar mass and small mediastinal lymph nodes."  Findings: Neck: Mild motion degradation within the  neck.  A focus of hypermetabolism which projects in the region of the left pterygoid plates and is without CT correlate.  Left sided parotid nodule which measures 1.4 cm and a S.U.V. max of 6.7 on image 25.  Chest:  Hypermetabolism within the posterior aspect of the AP window region, likely corresponding to the small node in this area. This measures a S.U.V. max of 5.6 on image 87.  1.3 cm subcarinal node which measures a S.U.V. max of 3.6 on image 94. The central AP window node which measures 1.2 cm and a S.U.V. max of 3.3 on image 86.  Left infrahilar lung mass which is centered in the superior segment left lower lobe.  This measures 5.3 x 5.9 cm and a S.U.V. max of 18.5 on image 99.  This causes mass effect upon the patent left lower lobe endobronchial tree.  Abdomen/Pelvis:  No abnormal hypermetabolism.  [place]:  Mild hypermetabolism corresponding to right  eighth and ninth posterolateral rib fractures.  The 8th fracture is likely acute.  9th fracture is subacute, with abundant callus deposition. No other abnormal activity within the osseous structures.  CT images performed for attenuation correction demonstrate no significant findings within the neck.  Mucosal thickening of ethmoid air cells.  Ascending aortic aneurysm, 4.9 cm on image 95. Probable calcified LAD coronary artery atherosclerosis on image 96. Mild right-sided pleural thickening adjacent to above described rib fractures.  No underlying osseous mass.  Moderate centrilobular emphysema.  3 mm right upper lobe lung nodule on image 85.  Mild dependent bibasilar atelectasis.  Normal adrenal glands.  Fat containing left inguinal hernia. 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  IMPRESSION:  1.  Superior segment left lower lobe lung mass is most consistent with a primary bronchogenic carcinoma.  Mildly hypermetabolic ipsilateral mediastinal adenopathy is most consistent with nodal metastasis. Presuming non-small cell histology, by imaging T2bN2M0 or stage IIIA. 2.  Right-sided rib fractures which are favored to be post- traumatic and demonstrate low level hypermetabolism. 3.  No evidence of extrathoracic disease. 4.  Hypermetabolic left parotid nodule.  Suspicious for a benign or malignant primary neoplasm. 5.  Hypermetabolism corresponding to the left sided pterygoid plates.  Favored to be physiologic. Recommend attention on follow- up. 6.  Mild ascending aortic aneurysm, 4.9 cm. 7.  Probable age advanced coronary artery atherosclerosis. Correlate with risk factors. 8.  A 3 mm right upper lobe lung nodule which warrants followup attention.  Original Report Authenticated By: Consuello Bossier, M.D.    ASSESSMENT: This is a very pleasant 51 years old white male recently diagnosed with stage IIB/IIIa non-small cell lung cancer, adenocarcinoma, presenting with a large left perihilar mass as well as  questionable mediastinal lymphadenopathy.  PLAN: I have a lengthy discussion with the patient and his wife today about his disease stage, prognosis and treatment options. I explained to the patient that his case was discussed earlier today at the weekly thoracic conference and the consensus was to proceed with neoadjuvant chemotherapy followed by restaging scan and probably mediastinoscopy before surgical excision if no evidence for disease progression after the neoadjuvant chemotherapy. Other option was to proceed with concurrent chemoradiation now or after the neoadjuvant chemotherapy if the patient is not a surgical candidate at that time. I discussed all of these options in detail with the patient. He would like to proceed with the neoadjuvant chemotherapy as planned. I would treat him with 3 cycles of systemic chemotherapy with cisplatin 75 mg/M2 and Alimta 500 mg/M2  every 3 weeks. I discussed with the patient adverse effect of this treatment including but not limited to alopecia, myelosuppression, nausea or vomiting, peripheral neuropathy, liver or renal dysfunction. The patient expected to start the first cycle of this treatment next week. He would have a chemoeducation class before the first cycle. He would also receive vitamin B12 injection on the same day of the chemotherapy education class. The patient was given prescription today for Compazine 10 mg by mouth every 6 hours as needed for nausea, Decadron 4 mg by mouth twice a day the day before, day of and day after the chemotherapy and folic acid 1 mg by mouth daily. For the dry cough, I gave the patient prescription for Hycodan 5 ML by mouth every 6 hours as needed. I would also complete the staging workup I ordered an MRI of the brain to rule out any brain metastasis. The patient would come back for followup visit in 2 weeks for evaluation and management any adverse effect of his chemotherapy. He was advised to call me immediately if he has  any concerning symptoms in the interval. I gave the patient and his wife the time to ask questions and I answered them completely to their satisfaction.  All questions were answered. The patient knows to call the clinic with any problems, questions or concerns. We can certainly see the patient much sooner if necessary.  Thank you so much for allowing me to participate in the care of Lemuel Boodram. I will continue to follow up the patient with you and assist in his care.  I spent 35 minutes counseling the patient face to face. The total time spent in the appointment was 65 minutes.  Trice Aspinall K. 10/17/2011, 5:19 PM

## 2011-10-18 ENCOUNTER — Telehealth: Payer: Self-pay | Admitting: Internal Medicine

## 2011-10-18 NOTE — Telephone Encounter (Signed)
aware of all appts    aom

## 2011-10-21 ENCOUNTER — Telehealth: Payer: Self-pay | Admitting: Medical Oncology

## 2011-10-21 ENCOUNTER — Telehealth: Payer: Self-pay | Admitting: Pulmonary Disease

## 2011-10-21 DIAGNOSIS — C3490 Malignant neoplasm of unspecified part of unspecified bronchus or lung: Secondary | ICD-10-CM

## 2011-10-21 MED ORDER — DEXAMETHASONE 4 MG PO TABS: 4.0000 mg | ORAL_TABLET | Freq: Two times a day (BID) | ORAL | Status: AC

## 2011-10-21 MED ORDER — PROCHLORPERAZINE MALEATE 10 MG PO TABS: 10.0000 mg | ORAL_TABLET | Freq: Four times a day (QID) | ORAL | Status: DC | PRN

## 2011-10-21 MED ORDER — HYDROCODONE-HOMATROPINE 5-1.5 MG/5ML PO SYRP: 5.0000 mL | ORAL_SOLUTION | Freq: Four times a day (QID) | ORAL | Status: AC | PRN

## 2011-10-21 MED ORDER — BENZONATATE 200 MG PO CAPS: 200.0000 mg | ORAL_CAPSULE | Freq: Three times a day (TID) | ORAL | Status: DC | PRN

## 2011-10-21 NOTE — Telephone Encounter (Signed)
Pt's percocet's nor benzonatate was sent to the pharmacy from 10/04/11 OV. Per the pharmacy pt needs hard copy rx--they can not take percocet's verbally or faxed RX. I have sent benzonatate to the pharmacy. I made spouse aware of the percocet rx. She stated she will try and call pt pcp since Dr. Herma Carson is not in until Friday. Nothing further was needed

## 2011-10-21 NOTE — Telephone Encounter (Signed)
Called to review meds and he is going to call Dr. Marland KitchenZ" for refills for tessalon pearles and oxycodone -w/o  tylenol

## 2011-10-22 ENCOUNTER — Ambulatory Visit (HOSPITAL_BASED_OUTPATIENT_CLINIC_OR_DEPARTMENT_OTHER): Payer: BC Managed Care – PPO

## 2011-10-22 ENCOUNTER — Encounter: Payer: Self-pay | Admitting: *Deleted

## 2011-10-22 ENCOUNTER — Other Ambulatory Visit: Payer: BC Managed Care – PPO

## 2011-10-22 ENCOUNTER — Other Ambulatory Visit: Payer: Self-pay | Admitting: *Deleted

## 2011-10-22 ENCOUNTER — Ambulatory Visit (HOSPITAL_COMMUNITY)
Admission: RE | Admit: 2011-10-22 | Discharge: 2011-10-22 | Disposition: A | Discharge status: Home / Self Care | Payer: BC Managed Care – PPO | Source: Ambulatory Visit | Attending: Internal Medicine | Admitting: Internal Medicine

## 2011-10-22 DIAGNOSIS — C3490 Malignant neoplasm of unspecified part of unspecified bronchus or lung: Secondary | ICD-10-CM

## 2011-10-22 DIAGNOSIS — G319 Degenerative disease of nervous system, unspecified: Secondary | ICD-10-CM | POA: Insufficient documentation

## 2011-10-22 DIAGNOSIS — R220 Localized swelling, mass and lump, head: Secondary | ICD-10-CM | POA: Insufficient documentation

## 2011-10-22 DIAGNOSIS — Q283 Other malformations of cerebral vessels: Secondary | ICD-10-CM | POA: Insufficient documentation

## 2011-10-22 DIAGNOSIS — R221 Localized swelling, mass and lump, neck: Secondary | ICD-10-CM | POA: Insufficient documentation

## 2011-10-22 MED ORDER — CYANOCOBALAMIN 1000 MCG/ML IJ SOLN
1000.0000 ug | Freq: Once | INTRAMUSCULAR | Status: AC
  Administered 2011-10-22: 1000 ug via INTRAMUSCULAR

## 2011-10-22 MED ORDER — GADOBENATE DIMEGLUMINE 529 MG/ML IV SOLN
20.0000 mL | Freq: Once | INTRAVENOUS | Status: AC | PRN
  Administered 2011-10-22: 20 mL via INTRAVENOUS

## 2011-10-24 ENCOUNTER — Encounter: Payer: Self-pay | Admitting: Internal Medicine

## 2011-10-24 ENCOUNTER — Ambulatory Visit (HOSPITAL_BASED_OUTPATIENT_CLINIC_OR_DEPARTMENT_OTHER): Payer: BC Managed Care – PPO

## 2011-10-24 ENCOUNTER — Telehealth: Payer: Self-pay | Admitting: *Deleted

## 2011-10-24 ENCOUNTER — Other Ambulatory Visit (HOSPITAL_BASED_OUTPATIENT_CLINIC_OR_DEPARTMENT_OTHER): Payer: BC Managed Care – PPO

## 2011-10-24 VITALS — BP 142/88 | HR 79 | Temp 97.3°F | Resp 18

## 2011-10-24 DIAGNOSIS — C3490 Malignant neoplasm of unspecified part of unspecified bronchus or lung: Secondary | ICD-10-CM

## 2011-10-24 DIAGNOSIS — Z5111 Encounter for antineoplastic chemotherapy: Secondary | ICD-10-CM

## 2011-10-24 DIAGNOSIS — C3430 Malignant neoplasm of lower lobe, unspecified bronchus or lung: Secondary | ICD-10-CM

## 2011-10-24 LAB — CBC WITH DIFFERENTIAL/PLATELET
Basophils Absolute: 0 10*3/uL (ref 0.0–0.1)
EOS%: 0 % (ref 0.0–7.0)
Eosinophils Absolute: 0 10*3/uL (ref 0.0–0.5)
HCT: 44.3 % (ref 38.4–49.9)
HGB: 14.9 g/dL (ref 13.0–17.1)
MONO#: 0.5 10*3/uL (ref 0.1–0.9)
NEUT#: 18.6 10*3/uL — ABNORMAL HIGH (ref 1.5–6.5)
NEUT%: 91.5 % — ABNORMAL HIGH (ref 39.0–75.0)
RDW: 13.8 % (ref 11.0–14.6)
WBC: 20.3 10*3/uL — ABNORMAL HIGH (ref 4.0–10.3)
lymph#: 1.2 10*3/uL (ref 0.9–3.3)

## 2011-10-24 LAB — COMPREHENSIVE METABOLIC PANEL
Albumin: 4.1 g/dL (ref 3.5–5.2)
Alkaline Phosphatase: 97 U/L (ref 39–117)
CO2: 19 mEq/L (ref 19–32)
Calcium: 9.7 mg/dL (ref 8.4–10.5)
Chloride: 102 mEq/L (ref 96–112)
Glucose, Bld: 181 mg/dL — ABNORMAL HIGH (ref 70–99)
Potassium: 4.8 mEq/L (ref 3.5–5.3)
Sodium: 134 mEq/L — ABNORMAL LOW (ref 135–145)
Total Protein: 7.1 g/dL (ref 6.0–8.3)

## 2011-10-24 LAB — MAGNESIUM: Magnesium: 1.9 mg/dL (ref 1.5–2.5)

## 2011-10-24 MED ORDER — PALONOSETRON HCL INJECTION 0.25 MG/5ML
0.2500 mg | Freq: Once | INTRAVENOUS | Status: AC
  Administered 2011-10-24: 0.25 mg via INTRAVENOUS

## 2011-10-24 MED ORDER — DEXAMETHASONE SODIUM PHOSPHATE 4 MG/ML IJ SOLN
12.0000 mg | Freq: Once | INTRAMUSCULAR | Status: AC
  Administered 2011-10-24: 12 mg via INTRAVENOUS

## 2011-10-24 MED ORDER — SODIUM CHLORIDE 0.9 % IV SOLN
Freq: Once | INTRAVENOUS | Status: AC
  Administered 2011-10-24: 09:00:00 via INTRAVENOUS

## 2011-10-24 MED ORDER — SODIUM CHLORIDE 0.9 % IJ SOLN
3.0000 mL | INTRAMUSCULAR | Status: DC | PRN
  Filled 2011-10-24: qty 10

## 2011-10-24 MED ORDER — SODIUM CHLORIDE 0.9 % IV SOLN
75.0000 mg/m2 | Freq: Once | INTRAVENOUS | Status: AC
  Administered 2011-10-24: 167 mg via INTRAVENOUS
  Filled 2011-10-24: qty 167

## 2011-10-24 MED ORDER — SODIUM CHLORIDE 0.9 % IV SOLN
150.0000 mg | Freq: Once | INTRAVENOUS | Status: AC
  Administered 2011-10-24: 150 mg via INTRAVENOUS
  Filled 2011-10-24: qty 5

## 2011-10-24 MED ORDER — SODIUM CHLORIDE 0.9 % IV SOLN
500.0000 mg/m2 | Freq: Once | INTRAVENOUS | Status: AC
  Administered 2011-10-24: 1100 mg via INTRAVENOUS
  Filled 2011-10-24: qty 44

## 2011-10-24 MED ORDER — SODIUM CHLORIDE 0.9 % IJ SOLN
10.0000 mL | INTRAMUSCULAR | Status: DC | PRN
  Filled 2011-10-24: qty 10

## 2011-10-24 MED ORDER — POTASSIUM CHLORIDE 2 MEQ/ML IV SOLN
Freq: Once | INTRAVENOUS | Status: AC
  Administered 2011-10-24: 09:00:00 via INTRAVENOUS
  Filled 2011-10-24: qty 10

## 2011-10-24 MED ORDER — ALTEPLASE 2 MG IJ SOLR
2.0000 mg | Freq: Once | INTRAMUSCULAR | Status: DC | PRN
  Filled 2011-10-24: qty 2

## 2011-10-24 MED ORDER — HEPARIN SOD (PORK) LOCK FLUSH 100 UNIT/ML IV SOLN
500.0000 [IU] | Freq: Once | INTRAVENOUS | Status: DC | PRN
  Filled 2011-10-24: qty 5

## 2011-10-24 MED ORDER — HEPARIN SOD (PORK) LOCK FLUSH 100 UNIT/ML IV SOLN
250.0000 [IU] | Freq: Once | INTRAVENOUS | Status: DC | PRN
  Filled 2011-10-24: qty 5

## 2011-10-24 NOTE — Telephone Encounter (Signed)
EGFR TKI eligibility assay---alteration NOT detected, results given to Dr Donnald Garre to review.  SLJ

## 2011-10-24 NOTE — Progress Notes (Signed)
Total urine output...  1752 today. HL

## 2011-10-24 NOTE — Patient Instructions (Addendum)
[facility] Discharge Instructions for Patients Receiving Chemotherapy  Today you received the following chemotherapy agents -Cisplatin , Alimta To help prevent nausea and vomiting after your treatment, we encourage you to take your nausea medication as often as it is prescribed.    If you develop nausea and vomiting that is not controlled by your nausea medication, call the clinic. If it is after clinic hours your family physician or the after hours number for the clinic or go to the Emergency Department.   BELOW ARE SYMPTOMS THAT SHOULD BE REPORTED IMMEDIATELY:  *FEVER GREATER THAN 100.5 F  *CHILLS WITH OR WITHOUT FEVER  NAUSEA AND VOMITING THAT IS NOT CONTROLLED WITH YOUR NAUSEA MEDICATION  *UNUSUAL SHORTNESS OF BREATH  *UNUSUAL BRUISING OR BLEEDING  TENDERNESS IN MOUTH AND THROAT WITH OR WITHOUT PRESENCE OF ULCERS  *URINARY PROBLEMS  *BOWEL PROBLEMS  UNUSUAL RASH Items with * indicate a potential emergency and should be followed up as soon as possible.  If this is your first treatment one of the nurses will contact you 24 hours after your treatment. Please let the nurse know about any problems that you may have experienced. Feel free to call the clinic you have any questions or concerns. The clinic phone number is [phone].   I have been informed and understand all the instructions given to me. I know to contact the clinic, my physician, or go to the Emergency Department if any problems should occur. I do not have any questions at this time, but understand that I may call the clinic during office hours   should I have any questions or need assistance in obtaining follow up care.    __________________________________________  _____________  __________ Signature of Patient or Authorized Representative            Date                   Time    __________________________________________ Nurse's Signature

## 2011-10-24 NOTE — Progress Notes (Signed)
Patient wife did stop by my office today to talk about financial help,they have already apply for medicaid and she said that she is going to apply for food stamps.I call Lauren the social worker to see if we could come up and meet with her and we did,Mrs.Mccleese did meet with lauren and Lauren said that she would gather some information together and bring it to them .

## 2011-10-25 ENCOUNTER — Telehealth: Payer: Self-pay | Admitting: *Deleted

## 2011-10-25 ENCOUNTER — Telehealth: Payer: Self-pay | Admitting: Pulmonary Disease

## 2011-10-25 MED ORDER — OXYCODONE HCL 10 MG PO TABS: ORAL_TABLET | ORAL | Status: DC

## 2011-10-25 NOTE — Telephone Encounter (Signed)
Spoke with the pt wife and she states the pt is running low on pain meds prescribed by Dr. Herma Carson. She states at last OV Dr. Herma Carson mentioned changing it to a med that does not have acetaminophen in it. I called Dr. Herma Carson and advised. He states to prescribe the pt Oxycodone 5-10mg  every 6 hours as needed for pain x 1 month supply. Dr. Sherene Sires will you sign this rx since Dr. Herma Carson is in hospital? Please advise.Carron Curie, CMA

## 2011-10-25 NOTE — Telephone Encounter (Signed)
NO PROBLEMS OR QUESTIONS AT THIS TIME. PT. WILL CALL THE ON CALL PHYSICIAN IF THE NEED ARISES.

## 2011-10-25 NOTE — Telephone Encounter (Signed)
rx signed and placed at front for pick-up. Pt is aware.Carron Curie, CMA

## 2011-10-30 MED ORDER — PROMETHAZINE HCL 25 MG RE SUPP: 25.0000 mg | Freq: Four times a day (QID) | RECTAL | Status: DC | PRN

## 2011-10-31 ENCOUNTER — Ambulatory Visit (HOSPITAL_BASED_OUTPATIENT_CLINIC_OR_DEPARTMENT_OTHER): Payer: BC Managed Care – PPO | Admitting: Internal Medicine

## 2011-10-31 ENCOUNTER — Other Ambulatory Visit (HOSPITAL_BASED_OUTPATIENT_CLINIC_OR_DEPARTMENT_OTHER): Payer: BC Managed Care – PPO | Admitting: Lab

## 2011-10-31 ENCOUNTER — Telehealth: Payer: Self-pay | Admitting: Internal Medicine

## 2011-10-31 ENCOUNTER — Ambulatory Visit (HOSPITAL_BASED_OUTPATIENT_CLINIC_OR_DEPARTMENT_OTHER): Payer: BC Managed Care – PPO

## 2011-10-31 VITALS — BP 121/82 | HR 86 | Temp 96.9°F | Resp 18 | Ht 70.0 in | Wt 206.0 lb

## 2011-10-31 VITALS — BP 122/81 | HR 77 | Temp 97.6°F | Resp 18

## 2011-10-31 DIAGNOSIS — C3430 Malignant neoplasm of lower lobe, unspecified bronchus or lung: Secondary | ICD-10-CM

## 2011-10-31 DIAGNOSIS — C3490 Malignant neoplasm of unspecified part of unspecified bronchus or lung: Secondary | ICD-10-CM

## 2011-10-31 DIAGNOSIS — R112 Nausea with vomiting, unspecified: Secondary | ICD-10-CM

## 2011-10-31 LAB — CBC WITH DIFFERENTIAL/PLATELET
BASO%: 0.1 % (ref 0.0–2.0)
LYMPH%: 31.8 % (ref 14.0–49.0)
MCHC: 35 g/dL (ref 32.0–36.0)
MCV: 82.3 fL (ref 79.3–98.0)
MONO#: 0.5 10*3/uL (ref 0.1–0.9)
MONO%: 6.1 % (ref 0.0–14.0)
Platelets: 237 10*3/uL (ref 140–400)
RBC: 5.48 10*6/uL (ref 4.20–5.82)
RDW: 13.6 % (ref 11.0–14.6)
WBC: 8 10*3/uL (ref 4.0–10.3)

## 2011-10-31 LAB — COMPREHENSIVE METABOLIC PANEL
ALT: 52 U/L (ref 0–53)
Alkaline Phosphatase: 91 U/L (ref 39–117)
Sodium: 133 mEq/L — ABNORMAL LOW (ref 135–145)
Total Bilirubin: 1.2 mg/dL (ref 0.3–1.2)
Total Protein: 7 g/dL (ref 6.0–8.3)

## 2011-10-31 MED ORDER — SODIUM CHLORIDE 0.9 % IV SOLN
Freq: Once | INTRAVENOUS | Status: AC
  Administered 2011-10-31: 14:00:00 via INTRAVENOUS

## 2011-10-31 MED ORDER — FAMOTIDINE IN NACL 20-0.9 MG/50ML-% IV SOLN
20.0000 mg | Freq: Once | INTRAVENOUS | Status: AC
  Administered 2011-10-31: 20 mg via INTRAVENOUS

## 2011-10-31 MED ORDER — ONDANSETRON 16 MG/50ML IVPB (CHCC)
16.0000 mg | Freq: Once | INTRAVENOUS | Status: AC
  Administered 2011-10-31: 16 mg via INTRAVENOUS

## 2011-10-31 NOTE — Progress Notes (Signed)
[facility] [phone]   [phone]  OFFICE PROGRESS NOTE  Pcp Not In System No address on file  DIAGNOSIS: Stage IIB/IIIa non-small cell lung cancer, adenocarcinoma with a large left perihilar mass and questionable mediastinal lymphadenopathy diagnosed in July of 2013  PRIOR THERAPY: None.  CURRENT THERAPY: Neoadjuvant chemotherapy with cisplatin 75 mg/M2 and Alimta 500 mg/M2 given every 3 weeks. The patient is status post 1 cycle.  INTERVAL HISTORY: Roger Mckinney 51 y.o. male returns to the clinic today for followup visit accompanied by his wife. The patient start the first cycle of his neoadjuvant chemotherapy last week. He tolerated well except for the delayed nausea. He took Compazine with no improvement. He denied having any other significant complaints and specifically no fever or chills. He denied having any significant chest pain but has shortness breath with exertion, no cough or hemoptysis. He denied having any significant peripheral neuropathy.  MEDICAL HISTORY: Past Medical History  Diagnosis Date  . PNA (pneumonia)   . Lung mass   . Mitral valve prolapse   . Asthma     as a child  . GERD (gastroesophageal reflux disease)     hx of   . HPV (human papilloma virus) infection     hx of    ALLERGIES:  is allergic to penicillins.  MEDICATIONS:  Current Outpatient Prescriptions  Medication Sig Dispense Refill  . aspirin EC 81 MG tablet Take 81 mg by mouth daily.      . benzonatate (TESSALON) 200 MG capsule Take 1 capsule (200 mg total) by mouth 3 (three) times daily as needed.  60 capsule  0  . calcium carbonate (TUMS - DOSED IN MG ELEMENTAL CALCIUM) 500 MG chewable tablet Chew 1 tablet by mouth daily.      Marland Kitchen dexamethasone (DECADRON) 4 MG tablet Take 1 tablet (4 mg total) by mouth 2 (two) times daily with a meal.  40 tablet  0  . folic acid (FOLVITE) 1 MG tablet Take 1 mg by mouth Daily.      . Multiple Vitamin (MULTIVITAMIN WITH  MINERALS) TABS Take 1 tablet by mouth daily.      . Oxycodone HCl 10 MG TABS Take 1/2 to 1 whole tablet every 6 hours as needed for pain  60 tablet  0  . prochlorperazine (COMPAZINE) 10 MG tablet Take 1 tablet (10 mg total) by mouth every 6 (six) hours as needed.  30 tablet  0  . oxyCODONE-acetaminophen (PERCOCET) 10-325 MG per tablet Take 1 tablet by mouth every 6 (six) hours as needed.      . promethazine (PHENERGAN) 25 MG suppository Place 1 suppository (25 mg total) rectally every 6 (six) hours as needed for nausea.  12 each  0    SURGICAL HISTORY:  Past Surgical History  Procedure Date  . Appendectomy   . Tonsillectomy     REVIEW OF SYSTEMS:  A comprehensive review of systems was negative except for: Constitutional: positive for fatigue Gastrointestinal: positive for nausea and vomiting   PHYSICAL EXAMINATION: General appearance: alert, cooperative and no distress Head: Normocephalic, without obvious abnormality, atraumatic Neck: no adenopathy Lymph nodes: Cervical, supraclavicular, and axillary nodes normal. Resp: clear to auscultation bilaterally Cardio: regular rate and rhythm, S1, S2 normal, no murmur, click, rub or gallop GI: soft, non-tender; bowel sounds normal; no masses,  no organomegaly Extremities: extremities normal, atraumatic, no cyanosis or edema Neurologic: Alert and oriented X 3, normal strength and tone. Normal symmetric  reflexes. Normal coordination and gait  ECOG PERFORMANCE STATUS: 1 - Symptomatic but completely ambulatory  Blood pressure 121/82, pulse 86, temperature 96.9 F (36.1 C), temperature source Oral, resp. rate 18, height 5\' 10"  (1.778 m), weight 206 lb (93.441 kg).  LABORATORY DATA: Lab Results  Component Value Date   WBC 8.0 10/31/2011   HGB 15.8 10/31/2011   HCT 45.1 10/31/2011   MCV 82.3 10/31/2011   PLT 237 10/31/2011      Chemistry      Component Value Date/Time   NA 134* 10/24/2011 0813   K 4.8 10/24/2011 0813   CL 102 10/24/2011 0813     CO2 19 10/24/2011 0813   BUN 17 10/24/2011 0813   CREATININE 0.88 10/24/2011 0813      Component Value Date/Time   CALCIUM 9.7 10/24/2011 0813   ALKPHOS 97 10/24/2011 0813   AST 16 10/24/2011 0813   ALT 11 10/24/2011 0813   BILITOT 0.5 10/24/2011 0813       RADIOGRAPHIC STUDIES: Mr Laqueta Jean JY Contrast  10-31-11  *RADIOLOGY REPORT*  Clinical Data: New diagnosis of lung cancer.  Rule out metastatic disease.  MRI HEAD WITHOUT AND WITH CONTRAST  Technique:  Multiplanar, multiecho pulse sequences of the brain and surrounding structures were obtained according to standard protocol without and with intravenous contrast  Contrast: 20mL MULTIHANCE GADOBENATE DIMEGLUMINE 529 MG/ML IV SOLN  Comparison: 02/23/2005 CT.  No comparison MR.  Findings: No intracranial enhancing lesion or bony destructive lesion to suggest presence of intracranial metastatic disease.  Left frontal tiny developmental venous anomaly incidentally noted.  Associated with the posterior aspect of the left parotid gland is a 1.8 cm lesion.  Etiology indeterminate.  This may represent adenopathy or primary parotid mass.  No acute infarct.  No intracranial hemorrhage.  Mild atrophy without hydrocephalus.  Mild to moderate nonspecific white matter type changes.  This may be related to result of small vessel disease with other considerations including white matter type changes secondary to; vasculitis, inflammatory process, demyelinating process, migraine headaches or remote trauma felt to be secondary considerations although not excluded.  Major intracranial vascular structures are patent.  Paranasal sinus mucosal thickening with polypoid opacification left maxillary sinus.  IMPRESSION:  No intracranial enhancing lesion or bony destructive lesion to suggest presence of intracranial metastatic disease.  Left frontal tiny developmental venous anomaly incidentally noted.  Associated with the posterior aspect of the left parotid gland is a 1.8 cm  lesion.  Etiology indeterminate.  This may represent adenopathy or primary parotid mass.  Mild to moderate nonspecific white matter type changes as noted above.  Paranasal sinus mucosal thickening with polypoid opacification left maxillary sinus.  Original Report Authenticated By: Fuller Canada, M.D.   Nm Pet Image Initial (pi) Skull Base To Thigh  10/11/2011  *RADIOLOGY REPORT*  Clinical Data: Initial treatment strategy for staging of lung mass.  NUCLEAR MEDICINE PET CT SKULL BASE TO THIGH  Technique:  Technique:  19.0 mCi F-18 FDG was injected intravenously. CT data was obtained and used for attenuation correction and anatomic localization only.  (This was not acquired as a diagnostic CT examination.) Additional exam technical data entered on technologist worksheet.  Comparison: Plain film chest of 01/31/2005.  Outside CT dated 09/[birth year]/1937 not submitted. The clinic note of 10/09/2011 describes the outside report as a "left perihilar mass and small mediastinal lymph nodes."  Findings: Neck: Mild motion degradation within the neck.  A focus of hypermetabolism which projects in the region of the  left pterygoid plates and is without CT correlate.  Left sided parotid nodule which measures 1.4 cm and a S.U.V. max of 6.7 on image 25.  Chest:  Hypermetabolism within the posterior aspect of the AP window region, likely corresponding to the small node in this area. This measures a S.U.V. max of 5.6 on image 87.  1.3 cm subcarinal node which measures a S.U.V. max of 3.6 on image 94. The central AP window node which measures 1.2 cm and a S.U.V. max of 3.3 on image 86.  Left infrahilar lung mass which is centered in the superior segment left lower lobe.  This measures 5.3 x 5.9 cm and a S.U.V. max of 18.5 on image 99.  This causes mass effect upon the patent left lower lobe endobronchial tree.  Abdomen/Pelvis:  No abnormal hypermetabolism.  [place]:  Mild hypermetabolism corresponding to right eighth and ninth  posterolateral rib fractures.  The 8th fracture is likely acute.  9th fracture is subacute, with abundant callus deposition. No other abnormal activity within the osseous structures.  CT images performed for attenuation correction demonstrate no significant findings within the neck.  Mucosal thickening of ethmoid air cells.  Ascending aortic aneurysm, 4.9 cm on image 95. Probable calcified LAD coronary artery atherosclerosis on image 96. Mild right-sided pleural thickening adjacent to above described rib fractures.  No underlying osseous mass.  Moderate centrilobular emphysema.  3 mm right upper lobe lung nodule on image 85.  Mild dependent bibasilar atelectasis.  Normal adrenal glands.  Fat containing left inguinal hernia. 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  IMPRESSION:  1.  Superior segment left lower lobe lung mass is most consistent with a primary bronchogenic carcinoma.  Mildly hypermetabolic ipsilateral mediastinal adenopathy is most consistent with nodal metastasis. Presuming non-small cell histology, by imaging T2bN2M0 or stage IIIA. 2.  Right-sided rib fractures which are favored to be post- traumatic and demonstrate low level hypermetabolism. 3.  No evidence of extrathoracic disease. 4.  Hypermetabolic left parotid nodule.  Suspicious for a benign or malignant primary neoplasm. 5.  Hypermetabolism corresponding to the left sided pterygoid plates.  Favored to be physiologic. Recommend attention on follow- up. 6.  Mild ascending aortic aneurysm, 4.9 cm. 7.  Probable age advanced coronary artery atherosclerosis. Correlate with risk factors. 8.  A 3 mm right upper lobe lung nodule which warrants followup attention.  Original Report Authenticated By: Consuello Bossier, M.D.    ASSESSMENT: This is a very pleasant 51 years old white male with locally advanced non-small cell lung cancer currently undergoing neoadjuvant chemotherapy with cisplatin and Alimta status post 1 cycle complicated  with delayed nausea and vomiting.  PLAN: I have a lengthy discussion with the patient and his wife today about his condition. I recommended for him to have 1 L of normal saline in addition to 60 mg on Zofran IV today. I recommended for the patient to take a Decadron 4 mg by mouth twice a day for the next 5 days and to to do this after every chemotherapy cycle. He was also advised to take Compazine 10 mg by mouth every 6 hours on a scheduled basis for the 5 days after his chemotherapy. He would come back for followup visit in 2 weeks with the start of cycle #2 of his treatment. He was advised to call me immediately if he has any concerning symptoms in the interval.  All questions were answered. The patient knows to call the clinic with any problems, questions or  concerns. We can certainly see the patient much sooner if necessary.  I spent 15 minutes counseling the patient face to face. The total time spent in the appointment was 25 minutes.

## 2011-10-31 NOTE — Telephone Encounter (Signed)
gve the pt's wife the sept 2013 appt calendar

## 2011-10-31 NOTE — Patient Instructions (Addendum)
Nausea and Vomiting Nausea means you feel sick to your stomach. Throwing up (vomiting) is a reflex where stomach contents come out of your mouth. HOME CARE   Take medicine as told by your doctor.   Do not force yourself to eat. However, you do need to drink fluids.   If you feel like eating, eat a normal diet as told by your doctor.   Eat rice, wheat, potatoes, bread, lean meats, yogurt, fruits, and vegetables.   Avoid high-fat foods.   Drink enough fluids to keep your pee (urine) clear or pale yellow.   Ask your doctor how to replace body fluid losses (rehydrate). Signs of body fluid loss (dehydration) include:   Feeling very thirsty.   Dry lips and mouth.   Feeling dizzy.   Dark pee.   Peeing less than normal.   Feeling confused.   Fast breathing or heart rate.  GET HELP RIGHT AWAY IF:   You have blood in your throw up.   You have black or bloody poop (stool).   You have a bad headache or stiff neck.   You feel confused.   You have bad belly (abdominal) pain.   You have chest pain or trouble breathing.   You do not pee at least once every 8 hours.   You have cold, clammy skin.   You keep throwing up after 24 to 48 hours.   You have a fever.  MAKE SURE YOU:   Understand these instructions.   Will watch your condition.   Will get help right away if you are not doing well or get worse.  Document Released: 08/14/2007 Document Revised: 02/14/2011 Document Reviewed: 07/27/2010 [facility] Patient Information 2012 [place], [place].Dehydration, Adult Dehydration means your body does not have as much fluid as it needs. Your kidneys, brain, and heart will not work properly without the right amount of fluids and salt.  HOME CARE  Ask your doctor how to replace body fluid losses (rehydrate).   Drink enough fluids to keep your pee (urine) clear or pale yellow.   Drink small amounts of fluids often if you feel sick to your stomach (nauseous) or throw up (vomit).     Eat like you normally do.   Avoid:   Foods or drinks high in sugar.   Bubbly (carbonated) drinks.   Juice.   Very hot or cold fluids.   Drinks with caffeine.   Fatty, greasy foods.   Alcohol.   Tobacco.   Eating too much.   Gelatin desserts.   Wash your hands to avoid spreading germs (bacteria, viruses).   Only take medicine as told by your doctor.   Keep all doctor visits as told.  GET HELP RIGHT AWAY IF:   You cannot drink something without throwing up.   You get worse even with treatment.   Your vomit has blood in it or looks greenish.   Your poop (stool) has blood in it or looks black and tarry.   You have not peed in 6 to 8 hours.   You pee a small amount of very dark pee.   You have a fever.   You pass out (faint).   You have belly (abdominal) pain that gets worse or stays in one spot (localizes).   You have a rash, stiff neck, or bad headache.   You get easily annoyed, sleepy, or are hard to wake up.   You feel weak, dizzy, or very thirsty.  MAKE SURE YOU:   Understand these  instructions.   Will watch your condition.   Will get help right away if you are not doing well or get worse.  Document Released: 12/22/2008 Document Revised: 02/14/2011 Document Reviewed: 10/15/2010 [facility] Patient Information 2012 [place], [place].

## 2011-11-01 ENCOUNTER — Encounter: Payer: Self-pay | Admitting: Internal Medicine

## 2011-11-01 NOTE — Progress Notes (Signed)
Patient approve for 65% Discount for a family of two,income [phone]  11/01/11 - 05/03/12.

## 2011-11-04 ENCOUNTER — Other Ambulatory Visit: Payer: Self-pay | Admitting: *Deleted

## 2011-11-04 MED ORDER — PROCHLORPERAZINE MALEATE 10 MG PO TABS: 10.0000 mg | ORAL_TABLET | Freq: Four times a day (QID) | ORAL | Status: DC | PRN

## 2011-11-05 ENCOUNTER — Encounter: Payer: Self-pay | Admitting: *Deleted

## 2011-11-05 ENCOUNTER — Encounter: Payer: Self-pay | Admitting: Internal Medicine

## 2011-11-05 NOTE — Progress Notes (Signed)
EGFR mutation not detected Dr. Arbutus Ped aware

## 2011-11-05 NOTE — Progress Notes (Signed)
ALK mutation not detected Dr. Arbutus Ped aware

## 2011-11-10 DIAGNOSIS — K089 Disorder of teeth and supporting structures, unspecified: Secondary | ICD-10-CM

## 2011-11-10 HISTORY — DX: Disorder of teeth and supporting structures, unspecified: K08.9

## 2011-11-13 ENCOUNTER — Other Ambulatory Visit: Payer: Self-pay | Admitting: Internal Medicine

## 2011-11-14 ENCOUNTER — Ambulatory Visit (HOSPITAL_BASED_OUTPATIENT_CLINIC_OR_DEPARTMENT_OTHER): Payer: BC Managed Care – PPO | Admitting: Physician Assistant

## 2011-11-14 ENCOUNTER — Other Ambulatory Visit (HOSPITAL_BASED_OUTPATIENT_CLINIC_OR_DEPARTMENT_OTHER): Payer: BC Managed Care – PPO | Admitting: Lab

## 2011-11-14 VITALS — BP 148/98 | HR 62 | Temp 98.4°F | Resp 18 | Ht 70.0 in | Wt 212.0 lb

## 2011-11-14 DIAGNOSIS — R066 Hiccough: Secondary | ICD-10-CM

## 2011-11-14 DIAGNOSIS — C3490 Malignant neoplasm of unspecified part of unspecified bronchus or lung: Secondary | ICD-10-CM

## 2011-11-14 DIAGNOSIS — C3430 Malignant neoplasm of lower lobe, unspecified bronchus or lung: Secondary | ICD-10-CM

## 2011-11-14 DIAGNOSIS — F411 Generalized anxiety disorder: Secondary | ICD-10-CM

## 2011-11-14 DIAGNOSIS — R110 Nausea: Secondary | ICD-10-CM

## 2011-11-14 LAB — CBC WITH DIFFERENTIAL/PLATELET
Basophils Absolute: 0 10*3/uL (ref 0.0–0.1)
Eosinophils Absolute: 0 10*3/uL (ref 0.0–0.5)
HGB: 16.3 g/dL (ref 13.0–17.1)
MONO#: 1.8 10*3/uL — ABNORMAL HIGH (ref 0.1–0.9)
NEUT#: 12 10*3/uL — ABNORMAL HIGH (ref 1.5–6.5)
RBC: 5.43 10*6/uL (ref 4.20–5.82)
RDW: 16.5 % — ABNORMAL HIGH (ref 11.0–14.6)
WBC: 15.8 10*3/uL — ABNORMAL HIGH (ref 4.0–10.3)
lymph#: 2 10*3/uL (ref 0.9–3.3)

## 2011-11-14 LAB — COMPREHENSIVE METABOLIC PANEL (CC13)
AST: 15 U/L (ref 5–34)
Alkaline Phosphatase: 101 U/L (ref 40–150)
BUN: 20 mg/dL (ref 7.0–26.0)
Creatinine: 0.9 mg/dL (ref 0.7–1.3)
Potassium: 4.8 mEq/L (ref 3.5–5.1)

## 2011-11-14 MED ORDER — DEXAMETHASONE 4 MG PO TABS: ORAL_TABLET | ORAL | Status: DC

## 2011-11-14 MED ORDER — LORAZEPAM 0.5 MG PO TABS: ORAL_TABLET | ORAL | Status: DC

## 2011-11-14 MED ORDER — OXYCODONE HCL 10 MG PO TABS: ORAL_TABLET | ORAL | Status: DC

## 2011-11-14 MED ORDER — BACLOFEN 10 MG PO TABS: ORAL_TABLET | ORAL | Status: DC

## 2011-11-14 NOTE — Patient Instructions (Addendum)
Take Ativan 0.5 mg by mouth every 8 to 12 hours as needed for anxiety of nausea Take Baclofen 10 mg, 1 or 2 tablets by mouth every 8 hours as needed for hiccups Continue your current pain medication as needed. Continue weekly labs as scheduled Follow up in 3 weeks prior to your next scheduled cycle of adjuvant chemotherapy

## 2011-11-15 ENCOUNTER — Ambulatory Visit (HOSPITAL_BASED_OUTPATIENT_CLINIC_OR_DEPARTMENT_OTHER): Payer: BC Managed Care – PPO

## 2011-11-15 ENCOUNTER — Telehealth: Payer: Self-pay | Admitting: *Deleted

## 2011-11-15 VITALS — BP 140/92 | HR 72 | Temp 97.8°F | Resp 18

## 2011-11-15 DIAGNOSIS — R112 Nausea with vomiting, unspecified: Secondary | ICD-10-CM

## 2011-11-15 DIAGNOSIS — Z5111 Encounter for antineoplastic chemotherapy: Secondary | ICD-10-CM

## 2011-11-15 DIAGNOSIS — C3430 Malignant neoplasm of lower lobe, unspecified bronchus or lung: Secondary | ICD-10-CM

## 2011-11-15 DIAGNOSIS — C3490 Malignant neoplasm of unspecified part of unspecified bronchus or lung: Secondary | ICD-10-CM

## 2011-11-15 MED ORDER — SODIUM CHLORIDE 0.9 % IV SOLN
150.0000 mg | Freq: Once | INTRAVENOUS | Status: AC
  Administered 2011-11-15: 150 mg via INTRAVENOUS
  Filled 2011-11-15: qty 5

## 2011-11-15 MED ORDER — SODIUM CHLORIDE 0.9 % IV SOLN
500.0000 mg/m2 | Freq: Once | INTRAVENOUS | Status: AC
  Administered 2011-11-15: 1100 mg via INTRAVENOUS
  Filled 2011-11-15: qty 44

## 2011-11-15 MED ORDER — MANNITOL 25 % IV SOLN
Freq: Once | INTRAVENOUS | Status: AC
  Administered 2011-11-15: 09:00:00 via INTRAVENOUS
  Filled 2011-11-15: qty 10

## 2011-11-15 MED ORDER — DEXAMETHASONE SODIUM PHOSPHATE 4 MG/ML IJ SOLN
12.0000 mg | Freq: Once | INTRAMUSCULAR | Status: AC
  Administered 2011-11-15: 12 mg via INTRAVENOUS

## 2011-11-15 MED ORDER — PALONOSETRON HCL INJECTION 0.25 MG/5ML
0.2500 mg | Freq: Once | INTRAVENOUS | Status: AC
  Administered 2011-11-15: 0.25 mg via INTRAVENOUS

## 2011-11-15 MED ORDER — SODIUM CHLORIDE 0.9 % IV SOLN
Freq: Once | INTRAVENOUS | Status: AC
  Administered 2011-11-15: 09:00:00 via INTRAVENOUS

## 2011-11-15 MED ORDER — SODIUM CHLORIDE 0.9 % IV SOLN
75.0000 mg/m2 | Freq: Once | INTRAVENOUS | Status: AC
  Administered 2011-11-15: 167 mg via INTRAVENOUS
  Filled 2011-11-15: qty 167

## 2011-11-15 NOTE — Telephone Encounter (Signed)
Per staff message and POF I have moved appt.  JMW  

## 2011-11-15 NOTE — Patient Instructions (Addendum)
[facility] Discharge Instructions for Patients Receiving Chemotherapy  Today you received the following chemotherapy agents Alimta and Cisplatin  To help prevent nausea and vomiting after your treatment, we encourage you to take your nausea medication Begin taking it at 7 pm and take it as often as prescribed for the next 24 to 72 hours.   If you develop nausea and vomiting that is not controlled by your nausea medication, call the clinic. If it is after clinic hours your family physician or the after hours number for the clinic or go to the Emergency Department.   BELOW ARE SYMPTOMS THAT SHOULD BE REPORTED IMMEDIATELY:  *FEVER GREATER THAN 100.5 F  *CHILLS WITH OR WITHOUT FEVER  NAUSEA AND VOMITING THAT IS NOT CONTROLLED WITH YOUR NAUSEA MEDICATION  *UNUSUAL SHORTNESS OF BREATH  *UNUSUAL BRUISING OR BLEEDING  TENDERNESS IN MOUTH AND THROAT WITH OR WITHOUT PRESENCE OF ULCERS  *URINARY PROBLEMS  *BOWEL PROBLEMS  UNUSUAL RASH Items with * indicate a potential emergency and should be followed up as soon as possible.  One of the nurses will contact you 24 hours after your treatment. Please let the nurse know about any problems that you may have experienced. Feel free to call the clinic you have any questions or concerns. The clinic phone number is [phone].   I have been informed and understand all the instructions given to me. I know to contact the clinic, my physician, or go to the Emergency Department if any problems should occur. I do not have any questions at this time, but understand that I may call the clinic during office hours   should I have any questions or need assistance in obtaining follow up care.    __________________________________________  _____________  __________ Signature of Patient or Authorized Representative            Date                   Time    __________________________________________ Nurse's Signature    

## 2011-11-15 NOTE — Telephone Encounter (Signed)
sched w/AEJ 12/05/11, labs & chemo same day  Sent email to [facility] to move treatment

## 2011-11-15 NOTE — Progress Notes (Signed)
Treatment Plan Dated 11/15/2011. Produced 400 mL of urine at 1040 am.

## 2011-11-18 ENCOUNTER — Telehealth: Payer: Self-pay | Admitting: Medical Oncology

## 2011-11-18 DIAGNOSIS — B370 Candidal stomatitis: Secondary | ICD-10-CM

## 2011-11-18 MED ORDER — FLUCONAZOLE 200 MG PO TABS: 200.0000 mg | ORAL_TABLET | Freq: Every day | ORAL | Status: AC

## 2011-11-18 NOTE — Telephone Encounter (Addendum)
cynthai called to rpeort pt has " thrush" . I called back and the phone rang 10 times and no one answered.

## 2011-11-18 NOTE — Telephone Encounter (Addendum)
I contacted Roger Mckinney and she said pt has " thrush " since last Thursday . He has pain when he swallows and is nauseated. He has vomited a couple of times. I told pt to keep dentures out

## 2011-11-18 NOTE — Telephone Encounter (Signed)
Per Dr Arbutus Ped I called in diflucan for pt and pt notified

## 2011-11-19 NOTE — Progress Notes (Signed)
[facility] Telephone:([phone]   [phone]  OFFICE PROGRESS NOTE  Pcp Not In System No address on file  DIAGNOSIS: Stage IIB/IIIa non-small cell lung cancer, adenocarcinoma with a large left perihilar mass and questionable mediastinal lymphadenopathy diagnosed in July of 2013  PRIOR THERAPY: None.  CURRENT THERAPY: Neoadjuvant chemotherapy with cisplatin 75 mg/M2 and Alimta 500 mg/M2 given every 3 weeks. The patient is status post 1 cycle.  INTERVAL HISTORY: Roger Mckinney 51 y.o. male returns to the clinic today for followup visit accompanied by his wife. He did have some delayed nausea and vomiting related to his first cycle of neoadjuvant chemotherapy with cisplatin and Alimta. He was treated with IV fluids and Pepcid. He continues to have some mild nausea and dyspepsia. He is finding it hard to "sit still" and is feeling very "anxious" and having difficulty sleeping. He also complains of "bad hiccups" feeling as though his diaphragm "locks up" since taking the chemotherapy. When this happens he is to take very small breasts and the sensation gradually subsides.He denied having any other significant complaints and specifically no fever or chills. He denied having any significant chest pain but has shortness breath with exertion, no cough or hemoptysis. He denied having any significant peripheral neuropathy.  MEDICAL HISTORY: Past Medical History  Diagnosis Date  . PNA (pneumonia)   . Lung mass   . Mitral valve prolapse   . Asthma     as a child  . GERD (gastroesophageal reflux disease)     hx of   . HPV (human papilloma virus) infection     hx of    ALLERGIES:  is allergic to penicillins.  MEDICATIONS:  Current Outpatient Prescriptions  Medication Sig Dispense Refill  . aspirin EC 81 MG tablet Take 81 mg by mouth daily.      . baclofen (LIORESAL) 10 MG tablet One or two tablets by mouth every 8 hours as needed for hiccups  60 each  0  .  benzonatate (TESSALON) 200 MG capsule Take 1 capsule (200 mg total) by mouth 3 (three) times daily as needed.  60 capsule  0  . calcium carbonate (TUMS - DOSED IN MG ELEMENTAL CALCIUM) 500 MG chewable tablet Chew 1 tablet by mouth daily.      Marland Kitchen dexamethasone (DECADRON) 4 MG tablet 1 tablet by mouth twice a day, the day before, the day of and the day after chemotherapy. Or as directed  40 tablet  0  . fluconazole (DIFLUCAN) 200 MG tablet Take 1 tablet (200 mg total) by mouth daily.  10 tablet  0  . folic acid (FOLVITE) 1 MG tablet Take 1 mg by mouth Daily.      . Hydrocodone-Acetaminophen 5-300 MG TABS       . HYDROcodone-homatropine (HYCODAN) 5-1.5 MG/5ML syrup       . LORazepam (ATIVAN) 0.5 MG tablet 1 tablet by mouth or sublingually every 8 to 12 hours as needed for anxiety or nausea  40 tablet  0  . Multiple Vitamin (MULTIVITAMIN WITH MINERALS) TABS Take 1 tablet by mouth daily.      . Oxycodone HCl 10 MG TABS Take 1/2 to 1 whole tablet every 6 hours as needed for pain  60 tablet  0  . oxyCODONE-acetaminophen (PERCOCET) 10-325 MG per tablet Take 1 tablet by mouth every 6 (six) hours as needed.      . prochlorperazine (COMPAZINE) 10 MG tablet TAKE 1 TABLET BY MOUTH EVERY 6  HOURS AS NEEDED.  30 tablet  0  . promethazine (PHENERGAN) 25 MG suppository Place 1 suppository (25 mg total) rectally every 6 (six) hours as needed for nausea.  12 each  0    SURGICAL HISTORY:  Past Surgical History  Procedure Date  . Appendectomy   . Tonsillectomy     REVIEW OF SYSTEMS:  A comprehensive review of systems was negative except for: Constitutional: positive for fatigue Gastrointestinal: positive for nausea and vomiting Behavioral/Psych: positive for anxiety Bad hiccups.   PHYSICAL EXAMINATION: General appearance: alert, cooperative and no distress Head: Normocephalic, without obvious abnormality, atraumatic Neck: no adenopathy Lymph nodes: Cervical, supraclavicular, and axillary nodes normal. Resp:  clear to auscultation bilaterally Cardio: regular rate and rhythm, S1, S2 normal, no murmur, click, rub or gallop GI: soft, non-tender; bowel sounds normal; no masses,  no organomegaly Extremities: extremities normal, atraumatic, no cyanosis or edema Neurologic: Alert and oriented X 3, normal strength and tone. Normal symmetric reflexes. Normal coordination and gait  ECOG PERFORMANCE STATUS: 1 - Symptomatic but completely ambulatory  Blood pressure 148/98, pulse 62, temperature 98.4 F (36.9 C), temperature source Oral, resp. rate 18, height 5\' 10"  (1.778 m), weight 212 lb (96.163 kg).  LABORATORY DATA: Lab Results  Component Value Date   WBC 15.8* 11/14/2011   HGB 16.3 11/14/2011   HCT 47.4 11/14/2011   MCV 87.3 11/14/2011   PLT 164 11/14/2011      Chemistry      Component Value Date/Time   NA 134* 11/14/2011 1322   NA 133* 10/31/2011 1128   K 4.8 11/14/2011 1322   K 3.5 10/31/2011 1128   CL 101 11/14/2011 1322   CL 94* 10/31/2011 1128   CO2 23 11/14/2011 1322   CO2 28 10/31/2011 1128   BUN 20.0 11/14/2011 1322   BUN 32* 10/31/2011 1128   CREATININE 0.9 11/14/2011 1322   CREATININE 1.70* 10/31/2011 1128      Component Value Date/Time   CALCIUM 9.6 11/14/2011 1322   CALCIUM 9.5 10/31/2011 1128   ALKPHOS 101 11/14/2011 1322   ALKPHOS 91 10/31/2011 1128   AST 15 11/14/2011 1322   AST 35 10/31/2011 1128   ALT 22 11/14/2011 1322   ALT 52 10/31/2011 1128   BILITOT 0.50 11/14/2011 1322   BILITOT 1.2 10/31/2011 1128       RADIOGRAPHIC STUDIES: Mr Laqueta Jean ZO Contrast  11/07/11  *RADIOLOGY REPORT*  Clinical Data: New diagnosis of lung cancer.  Rule out metastatic disease.  MRI HEAD WITHOUT AND WITH CONTRAST  Technique:  Multiplanar, multiecho pulse sequences of the brain and surrounding structures were obtained according to standard protocol without and with intravenous contrast  Contrast: 20mL MULTIHANCE GADOBENATE DIMEGLUMINE 529 MG/ML IV SOLN  Comparison: 02/23/2005 CT.  No comparison MR.  Findings: No  intracranial enhancing lesion or bony destructive lesion to suggest presence of intracranial metastatic disease.  Left frontal tiny developmental venous anomaly incidentally noted.  Associated with the posterior aspect of the left parotid gland is a 1.8 cm lesion.  Etiology indeterminate.  This may represent adenopathy or primary parotid mass.  No acute infarct.  No intracranial hemorrhage.  Mild atrophy without hydrocephalus.  Mild to moderate nonspecific white matter type changes.  This may be related to result of small vessel disease with other considerations including white matter type changes secondary to; vasculitis, inflammatory process, demyelinating process, migraine headaches or remote trauma felt to be secondary considerations although not excluded.  Major intracranial vascular structures are patent.  Paranasal sinus mucosal thickening with polypoid opacification left maxillary sinus.  IMPRESSION:  No intracranial enhancing lesion or bony destructive lesion to suggest presence of intracranial metastatic disease.  Left frontal tiny developmental venous anomaly incidentally noted.  Associated with the posterior aspect of the left parotid gland is a 1.8 cm lesion.  Etiology indeterminate.  This may represent adenopathy or primary parotid mass.  Mild to moderate nonspecific white matter type changes as noted above.  Paranasal sinus mucosal thickening with polypoid opacification left maxillary sinus.  Original Report Authenticated By: Fuller Canada, M.D.   Nm Pet Image Initial (pi) Skull Base To Thigh  10/11/2011  *RADIOLOGY REPORT*  Clinical Data: Initial treatment strategy for staging of lung mass.  NUCLEAR MEDICINE PET CT SKULL BASE TO THIGH  Technique:  Technique:  19.0 mCi F-18 FDG was injected intravenously. CT data was obtained and used for attenuation correction and anatomic localization only.  (This was not acquired as a diagnostic CT examination.) Additional exam technical data entered on  technologist worksheet.  Comparison: Plain film chest of 01/31/2005.  Outside CT dated 09/28/1938 not submitted. The clinic note of 10/09/2011 describes the outside report as a "left perihilar mass and small mediastinal lymph nodes."  Findings: Neck: Mild motion degradation within the neck.  A focus of hypermetabolism which projects in the region of the left pterygoid plates and is without CT correlate.  Left sided parotid nodule which measures 1.4 cm and a S.U.V. max of 6.7 on image 25.  Chest:  Hypermetabolism within the posterior aspect of the AP window region, likely corresponding to the small node in this area. This measures a S.U.V. max of 5.6 on image 87.  1.3 cm subcarinal node which measures a S.U.V. max of 3.6 on image 94. The central AP window node which measures 1.2 cm and a S.U.V. max of 3.3 on image 86.  Left infrahilar lung mass which is centered in the superior segment left lower lobe.  This measures 5.3 x 5.9 cm and a S.U.V. max of 18.5 on image 99.  This causes mass effect upon the patent left lower lobe endobronchial tree.  Abdomen/Pelvis:  No abnormal hypermetabolism.  [place]:  Mild hypermetabolism corresponding to right eighth and ninth posterolateral rib fractures.  The 8th fracture is likely acute.  9th fracture is subacute, with abundant callus deposition. No other abnormal activity within the osseous structures.  CT images performed for attenuation correction demonstrate no significant findings within the neck.  Mucosal thickening of ethmoid air cells.  Ascending aortic aneurysm, 4.9 cm on image 95. Probable calcified LAD coronary artery atherosclerosis on image 96. Mild right-sided pleural thickening adjacent to above described rib fractures.  No underlying osseous mass.  Moderate centrilobular emphysema.  3 mm right upper lobe lung nodule on image 85.  Mild dependent bibasilar atelectasis.  Normal adrenal glands.  Fat containing left inguinal hernia. 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  IMPRESSION:  1.  Superior segment left lower lobe lung mass is most consistent with a primary bronchogenic carcinoma.  Mildly hypermetabolic ipsilateral mediastinal adenopathy is most consistent with nodal metastasis. Presuming non-small cell histology, by imaging [id number] or stage IIIA. 2.  Right-sided rib fractures which are favored to be post- traumatic and demonstrate low level hypermetabolism. 3.  No evidence of extrathoracic disease. 4.  Hypermetabolic left parotid nodule.  Suspicious for a benign or malignant primary neoplasm. 5.  Hypermetabolism corresponding to the left sided pterygoid plates.  Favored to be physiologic. Recommend  attention on follow- up. 6.  Mild ascending aortic aneurysm, 4.9 cm. 7.  Probable age advanced coronary artery atherosclerosis. Correlate with risk factors. 8.  A 3 mm right upper lobe lung nodule which warrants followup attention.  Original Report Authenticated By: Consuello Bossier, M.D.    ASSESSMENT//PLAN: This is a very pleasant 51 years old white male with locally advanced non-small cell lung cancer currently undergoing neoadjuvant chemotherapy with cisplatin and Alimta status post 1 cycle complicated with delayed nausea and vomiting. Patient was discussed with Dr. Arbutus Ped. He will proceed with his second cycle of neoadjuvant chemotherapy with cisplatin 75 mg per meter squared and Alimta 500 mg read square given her 3 weeks. To address his anxiety he is given a prescription for Ativan 0.5 mg that he may take every 8-12 hours for anxiety or nausea. To address his hiccups he is given a prescription for baclofen 10 mg tablets one to 2 tablets by mouth every 8 hours as needed for hiccups. He'll continue weekly labs as scheduled and return in 3 weeks prior to his next scheduled cycle of neoadjuvant chemotherapy.  Laural Benes, Roger [place] E, PA-C   All questions were answered. The patient knows to call the clinic with any problems, questions or concerns. We can  certainly see the patient much sooner if necessary.  I spent 20 minutes counseling the patient face to face. The total time spent in the appointment was 30 minutes.

## 2011-11-27 ENCOUNTER — Other Ambulatory Visit: Payer: Self-pay | Admitting: Medical Oncology

## 2011-11-27 ENCOUNTER — Telehealth: Payer: Self-pay | Admitting: Medical Oncology

## 2011-11-27 NOTE — Telephone Encounter (Signed)
Lower gum " infected pocket" tender to touch. Per dr Donnald Garre pt needs to see dentist. Pt coming in tomorrow for lab

## 2011-11-28 ENCOUNTER — Telehealth: Payer: Self-pay | Admitting: *Deleted

## 2011-11-28 ENCOUNTER — Other Ambulatory Visit (HOSPITAL_BASED_OUTPATIENT_CLINIC_OR_DEPARTMENT_OTHER): Payer: BC Managed Care – PPO | Admitting: Lab

## 2011-11-28 DIAGNOSIS — C3490 Malignant neoplasm of unspecified part of unspecified bronchus or lung: Secondary | ICD-10-CM

## 2011-11-28 LAB — COMPREHENSIVE METABOLIC PANEL (CC13)
ALT: 38 U/L (ref 0–55)
CO2: 22 mEq/L (ref 22–29)
Calcium: 9.5 mg/dL (ref 8.4–10.4)
Chloride: 103 mEq/L (ref 98–107)
Creatinine: 0.9 mg/dL (ref 0.7–1.3)
Glucose: 102 mg/dl — ABNORMAL HIGH (ref 70–99)
Total Bilirubin: 0.6 mg/dL (ref 0.20–1.20)

## 2011-11-28 LAB — CBC WITH DIFFERENTIAL/PLATELET
Eosinophils Absolute: 0.1 10*3/uL (ref 0.0–0.5)
HCT: 49 % (ref 38.4–49.9)
HGB: 16.8 g/dL (ref 13.0–17.1)
LYMPH%: 35.7 % (ref 14.0–49.0)
MONO#: 0.7 10*3/uL (ref 0.1–0.9)
NEUT#: 3.5 10*3/uL (ref 1.5–6.5)
NEUT%: 52 % (ref 39.0–75.0)
Platelets: 95 10*3/uL — ABNORMAL LOW (ref 140–400)
RBC: 5.55 10*6/uL (ref 4.20–5.82)
WBC: 6.6 10*3/uL (ref 4.0–10.3)
nRBC: 0 % (ref 0–0)

## 2011-11-28 LAB — TECHNOLOGIST REVIEW

## 2011-11-28 LAB — MAGNESIUM (CC13): Magnesium: 2.1 mg/dl (ref 1.5–2.5)

## 2011-11-28 NOTE — Telephone Encounter (Signed)
Pt in lobby per Diane desk RN request to review CBC results.  Gave copy to pt, WBC WNL.  Pt will show labwork to dental surgeon due to infected tooth.  SLJ

## 2011-11-30 ENCOUNTER — Telehealth: Payer: Self-pay | Admitting: Internal Medicine

## 2011-11-30 NOTE — Telephone Encounter (Signed)
S/w the pt's wife and she is aware of the appts on 12/05/2011@9 :15am

## 2011-12-05 ENCOUNTER — Ambulatory Visit (HOSPITAL_BASED_OUTPATIENT_CLINIC_OR_DEPARTMENT_OTHER): Payer: BC Managed Care – PPO | Admitting: Physician Assistant

## 2011-12-05 ENCOUNTER — Ambulatory Visit (HOSPITAL_BASED_OUTPATIENT_CLINIC_OR_DEPARTMENT_OTHER): Payer: BC Managed Care – PPO

## 2011-12-05 ENCOUNTER — Ambulatory Visit: Payer: BC Managed Care – PPO

## 2011-12-05 ENCOUNTER — Telehealth: Payer: Self-pay | Admitting: Internal Medicine

## 2011-12-05 ENCOUNTER — Other Ambulatory Visit (HOSPITAL_BASED_OUTPATIENT_CLINIC_OR_DEPARTMENT_OTHER): Payer: BC Managed Care – PPO

## 2011-12-05 VITALS — BP 133/99 | HR 104 | Temp 98.3°F | Resp 18 | Ht 70.0 in | Wt 203.6 lb

## 2011-12-05 DIAGNOSIS — C3490 Malignant neoplasm of unspecified part of unspecified bronchus or lung: Secondary | ICD-10-CM

## 2011-12-05 DIAGNOSIS — R112 Nausea with vomiting, unspecified: Secondary | ICD-10-CM

## 2011-12-05 DIAGNOSIS — Z5111 Encounter for antineoplastic chemotherapy: Secondary | ICD-10-CM

## 2011-12-05 DIAGNOSIS — F411 Generalized anxiety disorder: Secondary | ICD-10-CM

## 2011-12-05 DIAGNOSIS — C3430 Malignant neoplasm of lower lobe, unspecified bronchus or lung: Secondary | ICD-10-CM

## 2011-12-05 DIAGNOSIS — B370 Candidal stomatitis: Secondary | ICD-10-CM

## 2011-12-05 LAB — CBC WITH DIFFERENTIAL/PLATELET
EOS%: 0.1 % (ref 0.0–7.0)
Eosinophils Absolute: 0 10*3/uL (ref 0.0–0.5)
MCH: 30 pg (ref 27.2–33.4)
MCV: 87.1 fL (ref 79.3–98.0)
MONO%: 6.3 % (ref 0.0–14.0)
NEUT#: 7.4 10*3/uL — ABNORMAL HIGH (ref 1.5–6.5)
RBC: 5.13 10*6/uL (ref 4.20–5.82)
RDW: 16.8 % — ABNORMAL HIGH (ref 11.0–14.6)
lymph#: 1.7 10*3/uL (ref 0.9–3.3)
nRBC: 0 % (ref 0–0)

## 2011-12-05 LAB — MAGNESIUM (CC13): Magnesium: 2.3 mg/dl (ref 1.5–2.5)

## 2011-12-05 LAB — COMPREHENSIVE METABOLIC PANEL (CC13)
ALT: 23 U/L (ref 0–55)
AST: 11 U/L (ref 5–34)
Alkaline Phosphatase: 95 U/L (ref 40–150)
Creatinine: 0.9 mg/dL (ref 0.7–1.3)
Sodium: 132 mEq/L — ABNORMAL LOW (ref 136–145)
Total Bilirubin: 0.5 mg/dL (ref 0.20–1.20)

## 2011-12-05 MED ORDER — CYANOCOBALAMIN 1000 MCG/ML IJ SOLN
1000.0000 ug | Freq: Once | INTRAMUSCULAR | Status: AC
  Administered 2011-12-05: 1000 ug via INTRAMUSCULAR

## 2011-12-05 MED ORDER — PROMETHAZINE HCL 25 MG RE SUPP: 25.0000 mg | Freq: Four times a day (QID) | RECTAL | Status: DC | PRN

## 2011-12-05 MED ORDER — SODIUM CHLORIDE 0.9 % IV SOLN
Freq: Once | INTRAVENOUS | Status: AC
  Administered 2011-12-05: 11:00:00 via INTRAVENOUS

## 2011-12-05 MED ORDER — DEXAMETHASONE SODIUM PHOSPHATE 4 MG/ML IJ SOLN
12.0000 mg | Freq: Once | INTRAMUSCULAR | Status: AC
  Administered 2011-12-05: 12 mg via INTRAVENOUS

## 2011-12-05 MED ORDER — PALONOSETRON HCL INJECTION 0.25 MG/5ML
0.2500 mg | Freq: Once | INTRAVENOUS | Status: AC
  Administered 2011-12-05: 0.25 mg via INTRAVENOUS

## 2011-12-05 MED ORDER — SODIUM CHLORIDE 0.9 % IV SOLN
75.0000 mg/m2 | Freq: Once | INTRAVENOUS | Status: AC
  Administered 2011-12-05: 167 mg via INTRAVENOUS
  Filled 2011-12-05: qty 167

## 2011-12-05 MED ORDER — FLUCONAZOLE 100 MG PO TABS: ORAL_TABLET | ORAL | Status: DC

## 2011-12-05 MED ORDER — MANNITOL 25 % IV SOLN
Freq: Once | INTRAVENOUS | Status: AC
  Administered 2011-12-05: 11:00:00 via INTRAVENOUS
  Filled 2011-12-05: qty 10

## 2011-12-05 MED ORDER — SODIUM CHLORIDE 0.9 % IV SOLN
500.0000 mg/m2 | Freq: Once | INTRAVENOUS | Status: AC
  Administered 2011-12-05: 1100 mg via INTRAVENOUS
  Filled 2011-12-05: qty 44

## 2011-12-05 MED ORDER — SODIUM CHLORIDE 0.9 % IV SOLN
150.0000 mg | Freq: Once | INTRAVENOUS | Status: AC
  Administered 2011-12-05: 150 mg via INTRAVENOUS
  Filled 2011-12-05: qty 5

## 2011-12-05 NOTE — Patient Instructions (Addendum)
Follow up with Dr. Arbutus Ped in 3 weeks with a restaging CT scan of your chest

## 2011-12-05 NOTE — Progress Notes (Signed)
[facility] [phone]   Fax:([phone]  OFFICE PROGRESS NOTE  Pcp Not In System No address on file  DIAGNOSIS: Stage IIB/IIIa non-small cell lung cancer, adenocarcinoma with a large left perihilar mass and questionable mediastinal lymphadenopathy diagnosed in July of 2013  PRIOR THERAPY: None.  CURRENT THERAPY: Neoadjuvant chemotherapy with cisplatin 75 mg/M2 and Alimta 500 mg/M2 given every 3 weeks. The patient is status post 2 cycles.  INTERVAL HISTORY: Nussen Pullin 51 y.o. male returns to the clinic today for followup visit accompanied by his wife. He reports his nausea was best controlled with taking Decadron for an extra few days and Phenergan suppositories and he requests a refill for the suppositories. He has problems with recurrent thrush as it tends to come back to 3 days after chemotherapy. He's had some difficulty swallowing. He reports that he doesn't have as some much problems with constipation if he drinks enough water. The Ativan helped him tremendously with the anxiety and has he is been able to rest better as well. He denied having any other significant complaints and specifically no fever or chills. He denied having any significant chest pain but has shortness breath with exertion, no cough or hemoptysis. He denied having any significant peripheral neuropathy. He presents to proceed with cycle #3 of his neoadjuvant chemotherapy with cisplatin and Alimta.  MEDICAL HISTORY: Past Medical History  Diagnosis Date  . PNA (pneumonia)   . Lung mass   . Mitral valve prolapse   . Asthma     as a child  . GERD (gastroesophageal reflux disease)     hx of   . HPV (human papilloma virus) infection     hx of    ALLERGIES:  is allergic to penicillins.  MEDICATIONS:  Current Outpatient Prescriptions  Medication Sig Dispense Refill  . aspirin EC 81 MG tablet Take 81 mg by mouth daily.      . baclofen (LIORESAL) 10 MG tablet One or two tablets  by mouth every 8 hours as needed for hiccups  60 each  0  . benzonatate (TESSALON) 200 MG capsule Take 1 capsule (200 mg total) by mouth 3 (three) times daily as needed.  60 capsule  0  . calcium carbonate (TUMS - DOSED IN MG ELEMENTAL CALCIUM) 500 MG chewable tablet Chew 1 tablet by mouth daily.      . chlorhexidine (PERIDEX) 0.12 % solution       . clindamycin (CLEOCIN) 300 MG capsule       . dexamethasone (DECADRON) 4 MG tablet 1 tablet by mouth twice a day, the day before, the day of and the day after chemotherapy. Or as directed  40 tablet  0  . fluconazole (DIFLUCAN) 100 MG tablet Take 2 tablets by mouth on day 1, then take 1 tablet by mouth daily until completed  16 tablet  0  . folic acid (FOLVITE) 1 MG tablet Take 1 mg by mouth Daily.      . Hydrocodone-Acetaminophen 5-300 MG TABS       . HYDROcodone-homatropine (HYCODAN) 5-1.5 MG/5ML syrup       . LORazepam (ATIVAN) 0.5 MG tablet 1 tablet by mouth or sublingually every 8 to 12 hours as needed for anxiety or nausea  40 tablet  0  . Multiple Vitamin (MULTIVITAMIN WITH MINERALS) TABS Take 1 tablet by mouth daily.      . Oxycodone HCl 10 MG TABS Take 1/2 to 1 whole tablet every 6 hours  as needed for pain  60 tablet  0  . oxyCODONE-acetaminophen (PERCOCET) 10-325 MG per tablet Take 1 tablet by mouth every 6 (six) hours as needed.      . prochlorperazine (COMPAZINE) 10 MG tablet TAKE 1 TABLET BY MOUTH EVERY 6 HOURS AS NEEDED.  30 tablet  0  . promethazine (PHENERGAN) 25 MG suppository Place 1 suppository (25 mg total) rectally every 6 (six) hours as needed for nausea.  12 each  0  . DISCONTD: promethazine (PHENERGAN) 25 MG suppository Place 1 suppository (25 mg total) rectally every 6 (six) hours as needed for nausea.  12 each  0    SURGICAL HISTORY:  Past Surgical History  Procedure Date  . Appendectomy   . Tonsillectomy     REVIEW OF SYSTEMS:  A comprehensive review of systems was negative except for: Constitutional: positive for  fatigue Gastrointestinal: positive for nausea and vomiting Behavioral/Psych: positive for anxiety   PHYSICAL EXAMINATION: General appearance: alert, cooperative and no distress Head: Normocephalic, without obvious abnormality, atraumatic Neck: no adenopathy Lymph nodes: Cervical, supraclavicular, and axillary nodes normal. Resp: clear to auscultation bilaterally Cardio: regular rate and rhythm, S1, S2 normal, no murmur, click, rub or gallop GI: soft, non-tender; bowel sounds normal; no masses,  no organomegaly Extremities: extremities normal, atraumatic, no cyanosis or edema Neurologic: Alert and oriented X 3, normal strength and tone. Normal symmetric reflexes. Normal coordination and gait Mouth: Reveals thrush  ECOG PERFORMANCE STATUS: 1 - Symptomatic but completely ambulatory  Blood pressure 133/99, pulse 104, temperature 98.3 F (36.8 C), temperature source Oral, resp. rate 18, height 5\' 10"  (1.778 m), weight 203 lb 9.6 oz (92.352 kg).  LABORATORY DATA: Lab Results  Component Value Date   WBC 9.7 12/05/2011   HGB 15.4 12/05/2011   HCT 44.7 12/05/2011   MCV 87.1 12/05/2011   PLT 268 12/05/2011      Chemistry      Component Value Date/Time   NA 133* 11/28/2011 0921   NA 133* 10/31/2011 1128   K 4.5 11/28/2011 0921   K 3.5 10/31/2011 1128   CL 103 11/28/2011 0921   CL 94* 10/31/2011 1128   CO2 22 11/28/2011 0921   CO2 28 10/31/2011 1128   BUN 10.0 11/28/2011 0921   BUN 32* 10/31/2011 1128   CREATININE 0.9 11/28/2011 0921   CREATININE 1.70* 10/31/2011 1128      Component Value Date/Time   CALCIUM 9.5 11/28/2011 0921   CALCIUM 9.5 10/31/2011 1128   ALKPHOS 113 11/28/2011 0921   ALKPHOS 91 10/31/2011 1128   AST 14 11/28/2011 0921   AST 35 10/31/2011 1128   ALT 38 11/28/2011 0921   ALT 52 10/31/2011 1128   BILITOT 0.60 11/28/2011 0921   BILITOT 1.2 10/31/2011 1128       RADIOGRAPHIC STUDIES: Mr Laqueta Jean ZO Contrast  11-18-2011  *RADIOLOGY REPORT*  Clinical Data: New diagnosis of lung  cancer.  Rule out metastatic disease.  MRI HEAD WITHOUT AND WITH CONTRAST  Technique:  Multiplanar, multiecho pulse sequences of the brain and surrounding structures were obtained according to standard protocol without and with intravenous contrast  Contrast: 20mL MULTIHANCE GADOBENATE DIMEGLUMINE 529 MG/ML IV SOLN  Comparison: 02/23/2005 CT.  No comparison MR.  Findings: No intracranial enhancing lesion or bony destructive lesion to suggest presence of intracranial metastatic disease.  Left frontal tiny developmental venous anomaly incidentally noted.  Associated with the posterior aspect of the left parotid gland is a 1.8 cm lesion.  Etiology indeterminate.  This may represent adenopathy or primary parotid mass.  No acute infarct.  No intracranial hemorrhage.  Mild atrophy without hydrocephalus.  Mild to moderate nonspecific white matter type changes.  This may be related to result of small vessel disease with other considerations including white matter type changes secondary to; vasculitis, inflammatory process, demyelinating process, migraine headaches or remote trauma felt to be secondary considerations although not excluded.  Major intracranial vascular structures are patent.  Paranasal sinus mucosal thickening with polypoid opacification left maxillary sinus.  IMPRESSION:  No intracranial enhancing lesion or bony destructive lesion to suggest presence of intracranial metastatic disease.  Left frontal tiny developmental venous anomaly incidentally noted.  Associated with the posterior aspect of the left parotid gland is a 1.8 cm lesion.  Etiology indeterminate.  This may represent adenopathy or primary parotid mass.  Mild to moderate nonspecific white matter type changes as noted above.  Paranasal sinus mucosal thickening with polypoid opacification left maxillary sinus.  Original Report Authenticated By: Fuller Canada, M.D.   Nm Pet Image Initial (pi) Skull Base To Thigh  10/11/2011  *RADIOLOGY REPORT*   Clinical Data: Initial treatment strategy for staging of lung mass.  NUCLEAR MEDICINE PET CT SKULL BASE TO THIGH  Technique:  Technique:  19.0 mCi F-18 FDG was injected intravenously. CT data was obtained and used for attenuation correction and anatomic localization only.  (This was not acquired as a diagnostic CT examination.) Additional exam technical data entered on technologist worksheet.  Comparison: Plain film chest of 01/31/2005.  Outside CT dated 09/28/1938 not submitted. The clinic note of 10/09/2011 describes the outside report as a "left perihilar mass and small mediastinal lymph nodes."  Findings: Neck: Mild motion degradation within the neck.  A focus of hypermetabolism which projects in the region of the left pterygoid plates and is without CT correlate.  Left sided parotid nodule which measures 1.4 cm and a S.U.V. max of 6.7 on image 25.  Chest:  Hypermetabolism within the posterior aspect of the AP window region, likely corresponding to the small node in this area. This measures a S.U.V. max of 5.6 on image 87.  1.3 cm subcarinal node which measures a S.U.V. max of 3.6 on image 94. The central AP window node which measures 1.2 cm and a S.U.V. max of 3.3 on image 86.  Left infrahilar lung mass which is centered in the superior segment left lower lobe.  This measures 5.3 x 5.9 cm and a S.U.V. max of 18.5 on image 99.  This causes mass effect upon the patent left lower lobe endobronchial tree.  Abdomen/Pelvis:  No abnormal hypermetabolism.  [place]:  Mild hypermetabolism corresponding to right eighth and ninth posterolateral rib fractures.  The 8th fracture is likely acute.  9th fracture is subacute, with abundant callus deposition. No other abnormal activity within the osseous structures.  CT images performed for attenuation correction demonstrate no significant findings within the neck.  Mucosal thickening of ethmoid air cells.  Ascending aortic aneurysm, 4.9 cm on image 95. Probable calcified LAD  coronary artery atherosclerosis on image 96. Mild right-sided pleural thickening adjacent to above described rib fractures.  No underlying osseous mass.  Moderate centrilobular emphysema.  3 mm right upper lobe lung nodule on image 85.  Mild dependent bibasilar atelectasis.  Normal adrenal glands.  Fat containing left inguinal hernia. 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  IMPRESSION:  1.  Superior segment left lower lobe lung mass is most consistent with a primary bronchogenic carcinoma.  Mildly hypermetabolic ipsilateral mediastinal adenopathy is most consistent with nodal metastasis. Presuming non-small cell histology, by imaging [id number] or stage IIIA. 2.  Right-sided rib fractures which are favored to be post- traumatic and demonstrate low level hypermetabolism. 3.  No evidence of extrathoracic disease. 4.  Hypermetabolic left parotid nodule.  Suspicious for a benign or malignant primary neoplasm. 5.  Hypermetabolism corresponding to the left sided pterygoid plates.  Favored to be physiologic. Recommend attention on follow- up. 6.  Mild ascending aortic aneurysm, 4.9 cm. 7.  Probable age advanced coronary artery atherosclerosis. Correlate with risk factors. 8.  A 3 mm right upper lobe lung nodule which warrants followup attention.  Original Report Authenticated By: Consuello Bossier, M.D.    ASSESSMENT//PLAN: This is a very pleasant 51 years old white male with locally advanced non-small cell lung cancer currently undergoing neoadjuvant chemotherapy with cisplatin and Alimta status post 2 cycles complicated with delayed nausea and vomiting. Patient was discussed with Dr. Arbutus Ped. He will proceed with his third cycle of neoadjuvant chemotherapy with cisplatin 75 mg per meter squared and Alimta 500 mg meter square given every 3 weeks. A prescription for Phenergan 25 mg suppositories, and Diflucan are sent to his pharmacy of record via E. Scribe. He will continue with weekly he'll followup with  Dr. Arbutus Ped in 3 weeks with repeat CBC differential C. met, magnesium and CT of the chest with contrast to reevaluate his disease.  Laural Benes, Nikeisha Klutz E, PA-C   All questions were answered. The patient knows to call the clinic with any problems, questions or concerns. We can certainly see the patient much sooner if necessary.  I spent 20 minutes counseling the patient face to face. The total time spent in the appointment was 30 minutes.

## 2011-12-05 NOTE — Patient Instructions (Signed)
[facility] Discharge Instructions for Patients Receiving Chemotherapy  Today you received the following chemotherapy agents Alimta and Cisplatin.  To help prevent nausea and vomiting after your treatment, we encourage you to take your nausea medication.   If you develop nausea and vomiting that is not controlled by your nausea medication, call the clinic. If it is after clinic hours your family physician or the after hours number for the clinic or go to the Emergency Department.   BELOW ARE SYMPTOMS THAT SHOULD BE REPORTED IMMEDIATELY:  *FEVER GREATER THAN 100.5 F  *CHILLS WITH OR WITHOUT FEVER  NAUSEA AND VOMITING THAT IS NOT CONTROLLED WITH YOUR NAUSEA MEDICATION  *UNUSUAL SHORTNESS OF BREATH  *UNUSUAL BRUISING OR BLEEDING  TENDERNESS IN MOUTH AND THROAT WITH OR WITHOUT PRESENCE OF ULCERS  *URINARY PROBLEMS  *BOWEL PROBLEMS  UNUSUAL RASH Items with * indicate a potential emergency and should be followed up as soon as possible.  One of the nurses will contact you 24 hours after your treatment. Please let the nurse know about any problems that you may have experienced. Feel free to call the clinic you have any questions or concerns. The clinic phone number is [phone].   I have been informed and understand all the instructions given to me. I know to contact the clinic, my physician, or go to the Emergency Department if any problems should occur. I do not have any questions at this time, but understand that I may call the clinic during office hours   should I have any questions or need assistance in obtaining follow up care.    __________________________________________  _____________  __________ Signature of Patient or Authorized Representative            Date                   Time    __________________________________________ Nurse's Signature

## 2011-12-05 NOTE — Telephone Encounter (Signed)
Printed and gv pt OCT schedule. °

## 2011-12-10 ENCOUNTER — Telehealth: Payer: Self-pay | Admitting: Medical Oncology

## 2011-12-10 NOTE — Telephone Encounter (Signed)
On sat started having abdominal pain -cramping , drinking liquids i.e apple juice and ginger ale calms his stomach. He did vomit last night and felt better. He is taking compaizine.He stays -hunched over because of pain. Denies nausea /fever diarrhea.  Last BM several; days ago. I told him to continue drinking fluids and taking compazine. He did state he is still on clindamycin( has been on it 1 week) after lower teeth pulled last week.

## 2011-12-10 NOTE — Telephone Encounter (Signed)
Per Dr Donnald Garre may stop clindamycin - I instructed Roger Mckinney to have pt stop clindamycin and if symptoms worsen to take pt to ED

## 2011-12-12 ENCOUNTER — Telehealth: Payer: Self-pay | Admitting: Medical Oncology

## 2011-12-12 ENCOUNTER — Other Ambulatory Visit (HOSPITAL_BASED_OUTPATIENT_CLINIC_OR_DEPARTMENT_OTHER): Payer: BC Managed Care – PPO

## 2011-12-12 DIAGNOSIS — C3490 Malignant neoplasm of unspecified part of unspecified bronchus or lung: Secondary | ICD-10-CM

## 2011-12-12 LAB — CBC WITH DIFFERENTIAL/PLATELET
BASO%: 0.1 % (ref 0.0–2.0)
EOS%: 0.4 % (ref 0.0–7.0)
MCH: 30.7 pg (ref 27.2–33.4)
MCHC: 35.2 g/dL (ref 32.0–36.0)
MCV: 87.2 fL (ref 79.3–98.0)
MONO%: 2.7 % (ref 0.0–14.0)
RDW: 17.9 % — ABNORMAL HIGH (ref 11.0–14.6)
lymph#: 3.2 10*3/uL (ref 0.9–3.3)

## 2011-12-12 NOTE — Telephone Encounter (Signed)
Pt weak today, but he is walking around without problems , has had persistent vomiting (3 times in last 24  Hours ) and  thrush persists-he has 6 more diflucan to take. He will try phenergan supp today for vomiting. Also asking about labs. reviewed labs with pt. I told pt to finish all diflucan and to call back if vomiting persists.

## 2011-12-18 ENCOUNTER — Other Ambulatory Visit: Payer: Self-pay | Admitting: Physician Assistant

## 2011-12-19 ENCOUNTER — Other Ambulatory Visit (HOSPITAL_BASED_OUTPATIENT_CLINIC_OR_DEPARTMENT_OTHER): Payer: BC Managed Care – PPO | Admitting: Lab

## 2011-12-19 DIAGNOSIS — C3490 Malignant neoplasm of unspecified part of unspecified bronchus or lung: Secondary | ICD-10-CM

## 2011-12-19 LAB — COMPREHENSIVE METABOLIC PANEL (CC13)
ALT: 37 U/L (ref 0–55)
Albumin: 2.9 g/dL — ABNORMAL LOW (ref 3.5–5.0)
CO2: 22 mEq/L (ref 22–29)
Calcium: 9.1 mg/dL (ref 8.4–10.4)
Chloride: 102 mEq/L (ref 98–107)
Sodium: 134 mEq/L — ABNORMAL LOW (ref 136–145)
Total Protein: 5.6 g/dL — ABNORMAL LOW (ref 6.4–8.3)

## 2011-12-19 LAB — CBC WITH DIFFERENTIAL/PLATELET
BASO%: 0 % (ref 0.0–2.0)
Eosinophils Absolute: 0 10*3/uL (ref 0.0–0.5)
LYMPH%: 53.9 % — ABNORMAL HIGH (ref 14.0–49.0)
MCHC: 33.4 g/dL (ref 32.0–36.0)
MCV: 87.8 fL (ref 79.3–98.0)
MONO#: 0.4 10*3/uL (ref 0.1–0.9)
MONO%: 11.4 % (ref 0.0–14.0)
NEUT#: 1.1 10*3/uL — ABNORMAL LOW (ref 1.5–6.5)
RBC: 4.6 10*6/uL (ref 4.20–5.82)
RDW: 18.2 % — ABNORMAL HIGH (ref 11.0–14.6)
WBC: 3.2 10*3/uL — ABNORMAL LOW (ref 4.0–10.3)

## 2011-12-19 LAB — MAGNESIUM (CC13): Magnesium: 2.1 mg/dl (ref 1.5–2.5)

## 2011-12-23 ENCOUNTER — Ambulatory Visit (HOSPITAL_COMMUNITY)
Admission: RE | Admit: 2011-12-23 | Discharge: 2011-12-23 | Disposition: A | Discharge status: Home / Self Care | Payer: Medicaid Other | Source: Ambulatory Visit | Attending: Physician Assistant | Admitting: Physician Assistant

## 2011-12-23 DIAGNOSIS — C3490 Malignant neoplasm of unspecified part of unspecified bronchus or lung: Secondary | ICD-10-CM

## 2011-12-23 DIAGNOSIS — R599 Enlarged lymph nodes, unspecified: Secondary | ICD-10-CM | POA: Insufficient documentation

## 2011-12-23 MED ORDER — IOHEXOL 300 MG/ML  SOLN
80.0000 mL | Freq: Once | INTRAMUSCULAR | Status: AC | PRN
  Administered 2011-12-23: 80 mL via INTRAVENOUS

## 2011-12-26 ENCOUNTER — Telehealth: Payer: Self-pay | Admitting: Internal Medicine

## 2011-12-26 ENCOUNTER — Ambulatory Visit (HOSPITAL_BASED_OUTPATIENT_CLINIC_OR_DEPARTMENT_OTHER): Payer: BC Managed Care – PPO | Admitting: Internal Medicine

## 2011-12-26 ENCOUNTER — Other Ambulatory Visit (HOSPITAL_BASED_OUTPATIENT_CLINIC_OR_DEPARTMENT_OTHER): Payer: BC Managed Care – PPO | Admitting: Lab

## 2011-12-26 VITALS — BP 121/88 | HR 88 | Temp 98.4°F | Resp 20 | Ht 70.0 in | Wt 199.7 lb

## 2011-12-26 DIAGNOSIS — C3430 Malignant neoplasm of lower lobe, unspecified bronchus or lung: Secondary | ICD-10-CM

## 2011-12-26 DIAGNOSIS — C3490 Malignant neoplasm of unspecified part of unspecified bronchus or lung: Secondary | ICD-10-CM

## 2011-12-26 LAB — CBC WITH DIFFERENTIAL/PLATELET
BASO%: 0.4 % (ref 0.0–2.0)
LYMPH%: 39.2 % (ref 14.0–49.0)
MCHC: 34.7 g/dL (ref 32.0–36.0)
MONO#: 0.7 10*3/uL (ref 0.1–0.9)
RBC: 4.69 10*6/uL (ref 4.20–5.82)
WBC: 6.7 10*3/uL (ref 4.0–10.3)
lymph#: 2.6 10*3/uL (ref 0.9–3.3)

## 2011-12-26 LAB — COMPREHENSIVE METABOLIC PANEL (CC13)
AST: 21 U/L (ref 5–34)
Albumin: 3.1 g/dL — ABNORMAL LOW (ref 3.5–5.0)
Alkaline Phosphatase: 129 U/L (ref 40–150)
BUN: 5 mg/dL — ABNORMAL LOW (ref 7.0–26.0)
Calcium: 9.8 mg/dL (ref 8.4–10.4)
Chloride: 102 mEq/L (ref 98–107)
Glucose: 172 mg/dl — ABNORMAL HIGH (ref 70–99)
Potassium: 4.4 mEq/L (ref 3.5–5.1)
Sodium: 138 mEq/L (ref 136–145)
Total Protein: 5.9 g/dL — ABNORMAL LOW (ref 6.4–8.3)

## 2011-12-26 NOTE — Progress Notes (Signed)
[facility] Telephone:([phone]   Fax:(336) [phone]  OFFICE PROGRESS NOTE  DIAGNOSIS: Stage IIB/IIIa non-small cell lung cancer, adenocarcinoma with a large left perihilar mass and questionable mediastinal lymphadenopathy diagnosed in July of 2013   PRIOR THERAPY: Neoadjuvant chemotherapy with cisplatin 75 mg/M2 and Alimta 500 mg/M2 given every 3 weeks. The patient is status post 3 cycles.   CURRENT THERAPY: None.  INTERVAL HISTORY: Roger Mckinney 51 y.o. male returns to the clinic today for followup visit accompanied by his wife. The patient rated his neoadjuvant chemotherapy with cisplatin and Alimta fairly well except for the last cycle when he has more fatigue and weakness but this was coincident with his dental infection and he was treated with clindamycin during this period. He denied having any significant weight loss or night sweats. He denied having any chest pain, shortness breath, cough or hemoptysis. He has no significant fever or chills and no nausea or vomiting. The patient has repeat CT scan of the chest performed recently and he is here for evaluation and discussion of his scan results.  MEDICAL HISTORY: Past Medical History  Diagnosis Date  . PNA (pneumonia)   . Lung mass   . Mitral valve prolapse   . Asthma     as a child  . GERD (gastroesophageal reflux disease)     hx of   . HPV (human papilloma virus) infection     hx of    ALLERGIES:  is allergic to penicillins.  MEDICATIONS:  Current Outpatient Prescriptions  Medication Sig Dispense Refill  . aspirin EC 81 MG tablet Take 81 mg by mouth daily.      . baclofen (LIORESAL) 10 MG tablet One or two tablets by mouth every 8 hours as needed for hiccups  60 each  0  . benzonatate (TESSALON) 200 MG capsule Take 1 capsule (200 mg total) by mouth 3 (three) times daily as needed.  60 capsule  0  . calcium carbonate (TUMS - DOSED IN MG ELEMENTAL CALCIUM) 500 MG chewable tablet Chew 1 tablet by mouth  daily.      . chlorhexidine (PERIDEX) 0.12 % solution       . dexamethasone (DECADRON) 4 MG tablet 1 tablet by mouth twice a day, the day before, the day of and the day after chemotherapy. Or as directed  40 tablet  0  . fluconazole (DIFLUCAN) 100 MG tablet TAKE 2 TABLETS BY MOUTH DAY 1, THEN 1 TABLET ONCE DAILY UNTIL GONE  16 tablet  0  . folic acid (FOLVITE) 1 MG tablet Take 1 mg by mouth Daily.      Marland Kitchen HYDROcodone-homatropine (HYCODAN) 5-1.5 MG/5ML syrup       . LORazepam (ATIVAN) 0.5 MG tablet 1 tablet by mouth or sublingually every 8 to 12 hours as needed for anxiety or nausea  40 tablet  0  . Multiple Vitamin (MULTIVITAMIN WITH MINERALS) TABS Take 1 tablet by mouth daily.      . Oxycodone HCl 10 MG TABS Take 1/2 to 1 whole tablet every 6 hours as needed for pain  60 tablet  0  . oxyCODONE-acetaminophen (PERCOCET) 10-325 MG per tablet Take 1 tablet by mouth every 6 (six) hours as needed.      . prochlorperazine (COMPAZINE) 10 MG tablet TAKE 1 TABLET BY MOUTH EVERY 6 HOURS AS NEEDED.  30 tablet  0  . promethazine (PHENERGAN) 25 MG suppository Place 1 suppository (25 mg total) rectally every 6 (six) hours  as needed for nausea.  12 each  0    SURGICAL HISTORY:  Past Surgical History  Procedure Date  . Appendectomy   . Tonsillectomy     REVIEW OF SYSTEMS:  A comprehensive review of systems was negative except for: Constitutional: positive for fatigue   PHYSICAL EXAMINATION: General appearance: alert, cooperative and no distress Head: Normocephalic, without obvious abnormality, atraumatic Neck: no adenopathy Lymph nodes: Cervical, supraclavicular, and axillary nodes normal. Resp: clear to auscultation bilaterally Cardio: regular rate and rhythm, S1, S2 normal, no murmur, click, rub or gallop GI: soft, non-tender; bowel sounds normal; no masses,  no organomegaly Extremities: extremities normal, atraumatic, no cyanosis or edema Neurologic: Alert and oriented X 3, normal strength and tone.  Normal symmetric reflexes. Normal coordination and gait  ECOG PERFORMANCE STATUS: 1 - Symptomatic but completely ambulatory  Blood pressure 121/88, pulse 88, temperature 98.4 F (36.9 C), temperature source Oral, resp. rate 20, height 5\' 10"  (1.778 m), weight 199 lb 11.2 oz (90.583 kg).  LABORATORY DATA: Lab Results  Component Value Date   WBC 6.7 12/26/2011   HGB 14.4 12/26/2011   HCT 41.4 12/26/2011   MCV 88.4 12/26/2011   PLT 220 12/26/2011      Chemistry      Component Value Date/Time   NA 138 12/26/2011 0903   NA 133* 10/31/2011 1128   K 4.4 12/26/2011 0903   K 3.5 10/31/2011 1128   CL 102 12/26/2011 0903   CL 94* 10/31/2011 1128   CO2 26 12/26/2011 0903   CO2 28 10/31/2011 1128   BUN 5.0* 12/26/2011 0903   BUN 32* 10/31/2011 1128   CREATININE 1.1 12/26/2011 0903   CREATININE 1.70* 10/31/2011 1128      Component Value Date/Time   CALCIUM 9.8 12/26/2011 0903   CALCIUM 9.5 10/31/2011 1128   ALKPHOS 129 12/26/2011 0903   ALKPHOS 91 10/31/2011 1128   AST 21 12/26/2011 0903   AST 35 10/31/2011 1128   ALT 41 12/26/2011 0903   ALT 52 10/31/2011 1128   BILITOT 0.60 12/26/2011 0903   BILITOT 1.2 10/31/2011 1128       RADIOGRAPHIC STUDIES: Ct Chest W Contrast  12/23/2011  *RADIOLOGY REPORT*  Clinical Data: restaging lung cancer  CT CHEST WITH CONTRAST  Technique:  Multidetector CT imaging of the chest was performed following the standard protocol during bolus administration of intravenous contrast.  Contrast: 80mL OMNIPAQUE IOHEXOL 300 MG/ML  SOLN  Comparison: 10/11/2011  Findings: No axillary or supraclavicular adenopathy.  AP window lymph node measures 1 cm, image 22.  Previously 1.2 cm.  Subcarinal lymph node measures 0.8 cm, image 26.  Previously 1.3 cm.  No pericardial effusion.  Moderate changes of emphysema.  There is no pleural effusion.  Left lower lobe perihilar mass measures 3.7 x 4.9 cm, image 29. Previously 5.3 x 5.9 cm.  Small nodule in the right upper lobe is stable  measuring 3 mm, image 20.  The left upper lobe nodule is stable measuring 3.2 mm, image 20.  No new or progressive disease identified.  Upper abdominal imaging demonstrates no mass or adenopathy.  Right posterior rib fracture deformities are again noted.  IMPRESSION:  1.  There has been interval response to therapy.  The left lower lobe perihilar mass has decreased in size from previous exam. Interval improvement in mediastinal adenopathy.   Original Report Authenticated By: Rosealee Albee, M.D.     ASSESSMENT: This is a very pleasant 51 years old white male with locally  advanced stage IIB/IIIa non-small cell lung cancer, adenocarcinoma status post 3 cycles of neoadjuvant chemotherapy with cisplatin and Alimta with partial response and improvement in his disease.  PLAN: I discussed the scan results and showed the images to the patient and his wife. I recommended for him to have repeat PET scan for restaging of his disease before considering surgical resection. I will refer the patient to [facility] for evaluation and consideration of surgical resection of the residual tumor. I did not give him a followup appointment at this point but will be happy to see him after his surgical evaluation. The patient knows to call me immediately if he has any concerning symptoms in the interval.  All questions were answered. The patient knows to call the clinic with any problems, questions or concerns. We can certainly see the patient much sooner if necessary.

## 2011-12-26 NOTE — Telephone Encounter (Signed)
Gave pt appt for PET Scan 01/01/12 then see MD on 02/05/12 , labs for October cancelled per pt

## 2011-12-29 NOTE — Patient Instructions (Signed)
Your scan showed partial response of her disease. I would consider repeating a PET scan and then referral to surgery for consideration of surgical resection. Followup with me after your surgery

## 2012-01-01 ENCOUNTER — Encounter (HOSPITAL_COMMUNITY)
Admission: RE | Admit: 2012-01-01 | Discharge: 2012-01-01 | Disposition: A | Discharge status: Home / Self Care | Payer: BC Managed Care – PPO | Source: Ambulatory Visit | Attending: Internal Medicine | Admitting: Internal Medicine

## 2012-01-01 ENCOUNTER — Telehealth: Payer: Self-pay | Admitting: *Deleted

## 2012-01-01 ENCOUNTER — Other Ambulatory Visit: Payer: Self-pay | Admitting: Physician Assistant

## 2012-01-01 ENCOUNTER — Encounter (HOSPITAL_COMMUNITY): Payer: Self-pay

## 2012-01-01 DIAGNOSIS — C771 Secondary and unspecified malignant neoplasm of intrathoracic lymph nodes: Secondary | ICD-10-CM | POA: Insufficient documentation

## 2012-01-01 DIAGNOSIS — C3490 Malignant neoplasm of unspecified part of unspecified bronchus or lung: Secondary | ICD-10-CM | POA: Insufficient documentation

## 2012-01-01 DIAGNOSIS — S2239XA Fracture of one rib, unspecified side, initial encounter for closed fracture: Secondary | ICD-10-CM | POA: Insufficient documentation

## 2012-01-01 DIAGNOSIS — Z79899 Other long term (current) drug therapy: Secondary | ICD-10-CM | POA: Insufficient documentation

## 2012-01-01 DIAGNOSIS — R221 Localized swelling, mass and lump, neck: Secondary | ICD-10-CM | POA: Insufficient documentation

## 2012-01-01 DIAGNOSIS — R220 Localized swelling, mass and lump, head: Secondary | ICD-10-CM | POA: Insufficient documentation

## 2012-01-01 DIAGNOSIS — X58XXXA Exposure to other specified factors, initial encounter: Secondary | ICD-10-CM | POA: Insufficient documentation

## 2012-01-01 MED ORDER — FLUDEOXYGLUCOSE F - 18 (FDG) INJECTION
18.4000 | Freq: Once | INTRAVENOUS | Status: AC | PRN
  Administered 2012-01-01: 18.4 via INTRAVENOUS

## 2012-01-01 NOTE — Telephone Encounter (Signed)
Patient currently having PET scan.  Wife [name] came in reporting Roger Mckinney is not getting better and it's been a month since chemotherpay.  He has thrush in his throat, has phlegmn and when he swallows it he throws up.  Taking anti-emetics but has so much phlegmn.  Reports he can't take hycodan syrup due to the codeine in it.  No appetite and he looks thinner to her.  "Can see his shin bones."  His energy level is low and he spends a lot of time in bed.  Doesn't sleep through the night and takes a long nap during the day.  Asked that we look at his scans and let them know if something else is going on in his stomach perhaps.  Dr. Arbutus Ped notified of walk in discussion with wife.  Verbal order received and read back for pt. To try Robitussin or Mucinex.  Called and message left on Cynthia's mobile number [phone].  Called [facility].  Mucinex and Robitussin are available so Green card may be used.

## 2012-01-02 ENCOUNTER — Other Ambulatory Visit: Payer: BC Managed Care – PPO | Admitting: Lab

## 2012-01-09 ENCOUNTER — Other Ambulatory Visit: Payer: Self-pay | Admitting: Cardiothoracic Surgery

## 2012-01-09 ENCOUNTER — Encounter: Payer: Self-pay | Admitting: Cardiothoracic Surgery

## 2012-01-09 ENCOUNTER — Institutional Professional Consult (permissible substitution) (INDEPENDENT_AMBULATORY_CARE_PROVIDER_SITE_OTHER): Payer: BC Managed Care – PPO | Admitting: Cardiothoracic Surgery

## 2012-01-09 ENCOUNTER — Other Ambulatory Visit: Payer: BC Managed Care – PPO | Admitting: Lab

## 2012-01-09 ENCOUNTER — Other Ambulatory Visit: Payer: Self-pay | Admitting: *Deleted

## 2012-01-09 VITALS — BP 144/92 | HR 100 | Resp 18 | Ht 70.0 in | Wt 200.0 lb

## 2012-01-09 DIAGNOSIS — C3490 Malignant neoplasm of unspecified part of unspecified bronchus or lung: Secondary | ICD-10-CM

## 2012-01-09 DIAGNOSIS — D381 Neoplasm of uncertain behavior of trachea, bronchus and lung: Secondary | ICD-10-CM

## 2012-01-09 NOTE — Patient Instructions (Signed)
Flexible Bronchoscopy Bronchoscopy is a procedure for diagnosing problems in the lungs. It allows your caregiver to examine the passageways in the lungs by direct exam. This is accomplished by the use of a flexible telescope-like tool (flexible fiberoptic bronchoscope), which is passed down to the air passageways to be examined.  LET YOUR CAREGIVER KNOW ABOUT:   Allergies to food or medicine.  Medicines taken, including vitamins, herbs, eyedrops, over-the-counter medicines, and creams.  Use of steroids (by mouth or creams).  Previous problems with anesthetics or numbing medicines.  History of bleeding problems or blood clots.  Previous surgery.  Other health problems, including diabetes and kidney problems.  Possibility of pregnancy, if this applies. BEFORE THE PROCEDURE   Do not eat or drink anything 8 hours before the test.  Medications may be given to relax you, dry up your secretions, and to control coughing. The medication which dries up secretions will make your mouth very dry.  Local anesthetics (medications) are given to numb your mouth, nose, throat and voice box (larynx). PROCEDURE   Relax as much as possible during the procedure.  Follow the caregiver's instructions to speed up the procedure.      Your breathing is not blocked (obstructed) during this procedure. You will be able to breath normally during the procedure.  If tissue samples (biopsies) are needed in the outer portions of the lung, sometimes a special type of X-ray (fluoroscopy) is required.  Abnormal areas will be brushed or biopsied for exam under a microscope.  If any bleeding occurs from these areas, a drug may be used to make the blood vessels smaller (constrict) to stop or decrease the bleeding.  You may receive a chest X-ray following the procedure to make sure the lungs have not collapsed (pneumothorax). AFTER THE PROCEDURE   You may resume normal activities.  Call the caregiver or  return for an appointment as directed if biopsies were taken.  Do not eat or drink anything until cough and gag reflexes have returned. There is danger of burning yourself or getting food or water into your lungs when your mouth and airways are numb. After the numbness is gone, you may begin taking a normal diet. SEEK IMMEDIATE MEDICAL CARE IF:   You become lightheaded.  You get short of breath.  You become faint.  You develop chest pain.  You cough up blood. Document Released: 02/23/2000 Document Revised: 05/20/2011 Document Reviewed: 11/14/2010 [facility] Patient Information 2013 [place], [place].    Mediastinoscopy Mediastinoscopy is a procedure in which a small incision (cut by the surgeon) is made over the notch near the top of the sternum (the chest bone between your ribs). The finger of the surgeon is used to bluntly dissect down to the area which is to be examined by a small instrument similar to a small telescope (mediastinoscope).  PURPOSE OF THE TEST This test allows the diagnosis (learning what is wrong) of disease of the lymph nodes around the trachea (your windpipe) and the area where the trachea splits into the major bronchi (large air passages) that go to each lung. This test is often done before surgery to see if a lung cancer has spread to these lymph nodes (staging). If it has spread, surgery may not be helpful. A different form of treatment for that particular tumor may be needed. LET YOUR CAREGIVER KNOW ABOUT:  Previous problems with anesthetics or medicines used to numb the skin.  Allergies to dyes, iodine, foods, and/or latex.  Medicines taken including herbs,  eye drops, prescription medicines (especially medicines used to "thin the blood"), aspirin, and other over-the-counter medicines, and steroids (by mouth or as a cream).  History of bleeding or blood problems.  Possibility of pregnancy, if this applies.  History of blood clots in your legs and/or lungs  .  Previous surgery.  Other important health problems. BEFORE THE PROCEDURE   Stop smoking at least one week prior to surgery. This lowers risk during surgery.  Your caregiver may advise that you stop taking certain medications that may affect the outcome of the surgery and your ability to heal. For example, you may need to stop taking anti-inflammatories such as aspirin because of possible bleeding problems. Other medications may have interactions with anesthesia.  BE SURE TO LET YOUR CAREGIVER KNOW IF YOU HAVE BEEN ON STEROIDS FOR LONG PERIODS OF TIME. THIS IS CRITICAL.  Your caregiver will discuss possible risks and complications with you before surgery. In addition to the usual risks of anesthesia, other common risks and complications include: blood loss and replacement, temporary increase in pain due to surgery, or possible collapse of a lung. AFTER THE PROCEDURE  After surgery, you will be taken to the recovery area. A nurse will watch and check your progress. Generally you will be allowed to go home within a week barring other problems. HOME CARE INSTRUCTIONS   Follow your caregiver's instructions as to activities.  Only take over-the-counter or prescription medicines for pain, discomfort, or fever as directed by your caregiver. SEEK MEDICAL CARE IF:   Increased bleeding (more than a small spot) from the wound.  Redness, swelling, or increasing pain in the wound.  Pus coming from wound.  An unexplained oral temperature over 102 F (38.9 C).  A foul smell coming from the wound or dressing. SEEK IMMEDIATE MEDICAL CARE IF:   You develop a rash.  You have difficulty breathing.  You have any allergic problems. For the protection of your privacy, test results can not be given over the phone. Make sure you receive the results of your test. Ask as to how these results are to be obtained if you have not been informed. It is your responsibility to obtain your test  results. Document Released: 06/03/2000 Document Revised: 08/27/2011 Document Reviewed: 01/28/2007 [facility] Patient Information 2013 [place], [place].

## 2012-01-09 NOTE — Progress Notes (Signed)
[address]            [place] [address]          [phone]      Chesley Ferrare [facility] Medical Record #[id number] Date of Birth: 01/13/1961  Referring: Si Gaul, MD Primary Care: Pcp Not In System  Chief Complaint:    Chief Complaint  Patient presents with  . Lung Cancer    S/P PET Scan    History of Present Illness:    Stage IIB/IIIa non-small cell lung cancer, adenocarcinoma with a large left perihilar mass and questionable mediastinal lymphadenopathy diagnosed in July of 2013   Neoadjuvant chemotherapy with cisplatin 75 mg/M2 and Alimta 500 mg/M2 given every 3 weeks. The patient is status post 3 cycles. He notes the most recent treatment was more difficulty for him.       Current Activity/ Functional Status: Patient is  independent with mobility/ambulation, transfers, ADL's, IADL's.   Past Medical History  Diagnosis Date  . PNA (pneumonia)   . Lung mass   . Mitral valve prolapse   . Asthma     as a child  . GERD (gastroesophageal reflux disease)     hx of   . HPV (human papilloma virus) infection     hx of    Past Surgical History  Procedure Date  . Appendectomy   . Tonsillectomy     Family History  Problem Relation Age of Onset  . Diabetes Mother   . Breast cancer Sister   . Breast cancer Sister   . Lung cancer Father     was a smoker  . Colon cancer Paternal Grandmother   . Lung cancer Paternal Grandmother     never a smoker  . Lupus Sister     History   Social History  . Marital Status: Married    Spouse Name: N/A    Number of Children: N/A  . Years of Education: N/A   Occupational History  . HVAC Tech    Social History Main Topics  . Smoking status: Former Smoker -- 1.0 packs/day for 30 years    Types: Cigarettes    Quit date: 09/20/2011  . Smokeless tobacco: Never Used  . Alcohol Use: No  . Drug Use: Yes    Special: Marijuana     occassional   . Sexually Active: Not on file    Other Topics Concern  . Not on file   Social History Narrative  . No narrative on file    History  Smoking status  . Former Smoker -- 1.0 packs/day for 30 years  . Types: Cigarettes  . Quit date: 09/20/2011  Smokeless tobacco  . Never Used    History  Alcohol Use No     Allergies  Allergen Reactions  . Penicillins Other (See Comments)    Unknown....as an infant    No current outpatient prescriptions on file.       Review of Systems:     Cardiac Review of Systems: Y or N  Chest Pain [ n   ]  Resting SOB [ n  ] Exertional SOB  Cove.Etienne  ]  Orthopnea Milo.Brash  ]   Pedal Edema [ n  ]    Palpitations Milo.Brash  ] Syncope  [ n ]   Presyncope [ n  ]  General Review of Systems: [Y] = yes [  ]=  no Constitional: recent weight change [ y ]; anorexia Cove.Etienne  ]; fatigue [ y ]; nausea Cove.Etienne  ]; night sweats [  ]; fever [ n ]; or chills [  ];                                                                                                                                          Dental: poor dentition[ n ];   Eye : blurred vision [  ]; diplopia [   ]; vision changes [  ];  Amaurosis fugax[  ]; Resp: cough [  ];  wheezing[ n ];  hemoptysis[n  ]; shortness of breath[ y ]; paroxysmal nocturnal dyspnean[  ]; dyspnea on exertion[  ]; or orthopnea[  ];  GI:  gallstones[  ], vomiting[  ];  dysphagia[  ]; melena[  ];  hematochezia [  ]; heartburn[  ];   Hx of  Colonoscopy[ y ]; GU: kidney stones [  ]; hematuria[  ];   dysuria [  ];  nocturia[  ];  history of     obstruction [  ];             Skin: rash, swelling[  ];, hair loss[  ];  peripheral edema[  ];  or itching[  ]; Musculosketetal: myalgias[  ];  joint swelling[  ];  joint erythema[  ];  joint pain[  ];  back pain[  ];  Heme/Lymph: bruising[n  ];  bleeding[n  ];  anemia[n  ];  Neuro: TIA[ n ];  headaches[  ];  stroke[  ];  vertigo[  ];  seizures[  ];   paresthesias[  ];  difficulty walking[  ];  Psych:depression[ n ]; Elvina Mattes  ];  Endocrine: diabetes[  ];   thyroid dysfunction[  ];  Immunizations: Flu [  ]; Pneumococcal[  ];  Other:  Physical Exam: BP 144/92  Pulse 100  Resp 18  Ht 5\' 10"  (1.778 m)  Wt 200 lb (90.719 kg)  BMI 28.70 kg/m2  SpO2 97%  General appearance: alert, cooperative, appears stated age and fatigued Neurologic: intact Heart: regular rate and rhythm, S1, S2 normal, no murmur, click, rub or gallop and normal apical impulse Lungs: clear to auscultation bilaterally and normal percussion bilaterally Abdomen: soft, non-tender; bowel sounds normal; no masses,  no organomegaly Extremities: extremities normal, atraumatic, no cyanosis or edema and Homans sign is negative, no sign of DVT   Diagnostic Studies & Laboratory data:     Recent Radiology Findings:  Ct Chest W Contrast  12/23/2011  *RADIOLOGY REPORT*  Clinical Data: restaging lung cancer  CT CHEST WITH CONTRAST  Technique:  Multidetector CT imaging of the chest was performed following the standard protocol during bolus administration of intravenous contrast.  Contrast: 80mL OMNIPAQUE IOHEXOL 300 MG/ML  SOLN  Comparison: 10/11/2011  Findings: No axillary or supraclavicular adenopathy.  AP window lymph node measures  1 cm, image 22.  Previously 1.2 cm.  Subcarinal lymph node measures 0.8 cm, image 26.  Previously 1.3 cm.  No pericardial effusion.  Moderate changes of emphysema.  There is no pleural effusion.  Left lower lobe perihilar mass measures 3.7 x 4.9 cm, image 29. Previously 5.3 x 5.9 cm.  Small nodule in the right upper lobe is stable measuring 3 mm, image 20.  The left upper lobe nodule is stable measuring 3.2 mm, image 20.  No new or progressive disease identified.  Upper abdominal imaging demonstrates no mass or adenopathy.  Right posterior rib fracture deformities are again noted.  IMPRESSION:  1.  There has been interval response to therapy.  The left lower lobe perihilar mass has decreased in size from previous exam. Interval improvement in mediastinal  adenopathy.   Original Report Authenticated By: Rosealee Albee, M.D.    Nm Pet Image Restag (ps) Skull Base To Thigh  01/01/2012  *RADIOLOGY REPORT*  Clinical Data: Subsequent treatment strategy for lung cancer. Initial disease status to be/3N non-small cell lung cancer.  The patient has undergone neoadjuvant chemotherapy.  NUCLEAR MEDICINE PET SKULL BASE TO THIGH  Fasting Blood Glucose:  106  Technique:  18.4 mCi F-18 FDG was injected intravenously. CT data was obtained and used for attenuation correction and anatomic localization only.  (This was not acquired as a diagnostic CT examination.) Additional exam technical data entered on technologist worksheet.  Comparison:  PET CT scan 10/11/2011, CT scan 12/23/2011.  The  Findings:  Neck: No hypermetabolic lymph nodes in the neck.  There is hypermetabolic lesion within the posterior aspect of the left parotid gland.  This lesion is relatively dense on the CT imaging measuring 12 mm (image 22).  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  Chest:  Large left perihilar mass is decreased in size from comparison PET CT scan measuring 5.8 x 3.5 cm compared to 5.3 x 5.9 cm on prior.  However, the metabolic activity of the lesion is similar with SUV max = 21.9 compared to that SUV max = 18.5.  There is persistent hypermetabolic activity within a subcarinal lymph node with SUV max = 5.2 measuring 9 mm (image 88). Adjacent to the left lower lobe the main pulmonary bronchus is a 7 mm short axis and SUV max = 15.7 (image 84).  There are no contralateral hypermetabolic nodes.  There are several small pulmonary nodules which appear stable.  For example 3 mm left upper lobe pulmonary nodule (image 82).  3 mm right upper lobe pulmonary nodule (image 79).  Right posterior rib fractures noted.  The  Abdomen/Pelvis:  No abnormal hypermetabolic activity within the liver, pancreas, adrenal glands, or spleen.  No hypermetabolic lymph nodes in the  abdomen or pelvis.  Skeleton:  No focal hypermetabolic activity to suggest skeletal metastasis.  IMPRESSION:  1.  Dominant left hilar mass is decreased in size but the metabolic activity is not decreased. 2. Hypermetabolic metastatic subcarinal lymph node and left peribronchial lymph nodes.  3.  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   Original Report Authenticated By: Genevive Bi, M.D.      Recent Lab Findings: Lab Results  Component Value Date   WBC 6.7 12/26/2011   HGB 14.4 12/26/2011   HCT 41.4 12/26/2011   PLT 220 12/26/2011   GLUCOSE 172* 12/26/2011   ALT 41 12/26/2011   AST 21 12/26/2011  NA 138 12/26/2011   K 4.4 12/26/2011   CL 102 12/26/2011   CREATININE 1.1 12/26/2011   BUN 5.0* 12/26/2011   CO2 26 12/26/2011   INR 1.01 10/08/2011      Assessment / Plan:     Stage IIB/IIIa non-small cell lung cancer, adenocarcinoma with a large left perihilar mass and questionable mediastinal lymphadenopathy diagnosed in July of 2013  Ct scan has shown some response to the size of mass but still with hypermetabolic subcarinal nodes. At initial bx  Evaluation of #7 nodes was negative. Asked to see patient to consider resection.   Will obtain PFT's  Plan repeat bronch, EBUS, mediastinoscopy to fully evaluate mediastinum before recommending resection. Risks and procedure explained to patient in detail.         Delight Ovens MD  Beeper [phone] Office [phone] 01/09/2012 8:24 PM

## 2012-01-10 ENCOUNTER — Encounter (HOSPITAL_COMMUNITY): Payer: Self-pay | Admitting: Pharmacy Technician

## 2012-01-10 ENCOUNTER — Other Ambulatory Visit: Payer: Self-pay | Admitting: *Deleted

## 2012-01-10 NOTE — Pre-Procedure Instructions (Signed)
[address]  01/10/2012   Your procedure is scheduled on:  Tuesday, November 5th  Report to [facility] at 0830 AM.  Call this number if you have problems the morning of surgery: [phone]   Remember:   Do not eat food or drink:After Midnight.   Take these medicines the morning of surgery with A SIP OF WATER: ativan if needed, oxycodone, phenergan if needed   Do not wear jewelry, make-up or nail polish.  Do not wear lotions, powders, or perfumes.  Do not shave 48 hours prior to surgery. Men may shave face and neck.  Do not bring valuables to the hospital.  Contacts, dentures or bridgework may not be worn into surgery.  Leave suitcase in the car. After surgery it may be brought to your room.  For patients admitted to the hospital, checkout time is 11:00 AM the day of discharge.   Patients discharged the day of surgery will not be allowed to drive home.   Special Instructions: Shower using CHG 2 nights before surgery and the night before surgery.  If you shower the day of surgery use CHG.  Use special wash - you have one bottle of CHG for all showers.  You should use approximately 1/3 of the bottle for each shower.   Please read over the following fact sheets that you were given: Pain Booklet, Coughing and Deep Breathing, Blood Transfusion Information, MRSA Information and Surgical Site Infection Prevention

## 2012-01-13 ENCOUNTER — Encounter (HOSPITAL_COMMUNITY)
Admission: RE | Admit: 2012-01-13 | Discharge: 2012-01-13 | Disposition: A | Discharge status: Home / Self Care | Payer: Medicaid Other | Source: Ambulatory Visit | Attending: Cardiothoracic Surgery | Admitting: Cardiothoracic Surgery

## 2012-01-13 ENCOUNTER — Ambulatory Visit (HOSPITAL_COMMUNITY)
Admission: RE | Admit: 2012-01-13 | Discharge: 2012-01-13 | Disposition: A | Discharge status: Home / Self Care | Payer: Medicaid Other | Source: Ambulatory Visit | Attending: Cardiothoracic Surgery | Admitting: Cardiothoracic Surgery

## 2012-01-13 ENCOUNTER — Encounter (HOSPITAL_COMMUNITY): Payer: Self-pay

## 2012-01-13 ENCOUNTER — Other Ambulatory Visit: Payer: Self-pay | Admitting: *Deleted

## 2012-01-13 VITALS — BP 132/82 | HR 101 | Temp 99.0°F | Resp 20 | Ht 70.0 in | Wt 202.0 lb

## 2012-01-13 DIAGNOSIS — D381 Neoplasm of uncertain behavior of trachea, bronchus and lung: Secondary | ICD-10-CM

## 2012-01-13 DIAGNOSIS — C3490 Malignant neoplasm of unspecified part of unspecified bronchus or lung: Secondary | ICD-10-CM

## 2012-01-13 DIAGNOSIS — R222 Localized swelling, mass and lump, trunk: Secondary | ICD-10-CM | POA: Insufficient documentation

## 2012-01-13 HISTORY — DX: Disorder of teeth and supporting structures, unspecified: K08.9

## 2012-01-13 HISTORY — DX: Malignant (primary) neoplasm, unspecified: C80.1

## 2012-01-13 LAB — CBC
HCT: 38.4 % — ABNORMAL LOW (ref 39.0–52.0)
Hemoglobin: 12.9 g/dL — ABNORMAL LOW (ref 13.0–17.0)
MCH: 30.6 pg (ref 26.0–34.0)
MCHC: 33.6 g/dL (ref 30.0–36.0)
MCV: 91.2 fL (ref 78.0–100.0)
Platelets: 177 10*3/uL (ref 150–400)
RBC: 4.21 MIL/uL — ABNORMAL LOW (ref 4.22–5.81)
RDW: 20 % — ABNORMAL HIGH (ref 11.5–15.5)
WBC: 11 10*3/uL — ABNORMAL HIGH (ref 4.0–10.5)

## 2012-01-13 LAB — PULMONARY FUNCTION TEST

## 2012-01-13 LAB — COMPREHENSIVE METABOLIC PANEL
ALT: 9 U/L (ref 0–53)
AST: 17 U/L (ref 0–37)
Albumin: 3 g/dL — ABNORMAL LOW (ref 3.5–5.2)
Alkaline Phosphatase: 111 U/L (ref 39–117)
BUN: 5 mg/dL — ABNORMAL LOW (ref 6–23)
CO2: 26 mEq/L (ref 19–32)
Calcium: 9.8 mg/dL (ref 8.4–10.5)
Chloride: 104 mEq/L (ref 96–112)
Creatinine, Ser: 0.96 mg/dL (ref 0.50–1.35)
GFR calc Af Amer: >90 mL/min (ref >90)
GFR calc non Af Amer: >90 mL/min (ref >90)
Glucose, Bld: 90 mg/dL (ref 70–99)
Potassium: 4.8 mEq/L (ref 3.5–5.1)
Sodium: 138 mEq/L (ref 135–145)
Total Bilirubin: 0.6 mg/dL (ref 0.3–1.2)
Total Protein: 6.9 g/dL (ref 6.0–8.3)

## 2012-01-13 LAB — TYPE AND SCREEN
ABO/RH(D): O POS
Antibody Screen: NEGATIVE

## 2012-01-13 LAB — PROTIME-INR
INR: 1.06 (ref 0.00–1.49)
Prothrombin Time: 13.7 seconds (ref 11.6–15.2)

## 2012-01-13 LAB — SURGICAL PCR SCREEN
MRSA, PCR: NEGATIVE
Staphylococcus aureus: NEGATIVE

## 2012-01-13 LAB — ABO/RH: ABO/RH(D): O POS

## 2012-01-13 LAB — APTT: aPTT: 32 seconds (ref 24–37)

## 2012-01-13 MED ORDER — ALBUTEROL SULFATE (5 MG/ML) 0.5% IN NEBU
2.5000 mg | INHALATION_SOLUTION | Freq: Once | RESPIRATORY_TRACT | Status: AC
  Administered 2012-01-13: 2.5 mg via RESPIRATORY_TRACT

## 2012-01-13 MED ORDER — VANCOMYCIN HCL IN DEXTROSE 1-5 GM/200ML-% IV SOLN
1000.0000 mg | INTRAVENOUS | Status: AC
  Administered 2012-01-14: 1000 mg via INTRAVENOUS
  Filled 2012-01-13: qty 200

## 2012-01-13 NOTE — Progress Notes (Signed)
PFT WILL BE DONE TODAY AT 1045 PER PT AND HIS WIFE.

## 2012-01-14 ENCOUNTER — Ambulatory Visit (HOSPITAL_COMMUNITY): Payer: Medicaid Other | Admitting: Anesthesiology

## 2012-01-14 ENCOUNTER — Encounter (HOSPITAL_COMMUNITY): Payer: Self-pay | Admitting: Anesthesiology

## 2012-01-14 ENCOUNTER — Ambulatory Visit (HOSPITAL_COMMUNITY)
Admission: RE | Admit: 2012-01-14 | Discharge: 2012-01-14 | Disposition: A | Discharge status: Home / Self Care | Payer: Medicaid Other | Source: Ambulatory Visit | Attending: Cardiothoracic Surgery | Admitting: Cardiothoracic Surgery

## 2012-01-14 ENCOUNTER — Encounter (HOSPITAL_COMMUNITY)
Admission: RE | Disposition: A | Discharge status: Home / Self Care | Payer: Self-pay | Source: Ambulatory Visit | Attending: Cardiothoracic Surgery

## 2012-01-14 ENCOUNTER — Encounter (HOSPITAL_COMMUNITY): Payer: Self-pay | Admitting: *Deleted

## 2012-01-14 DIAGNOSIS — D381 Neoplasm of uncertain behavior of trachea, bronchus and lung: Secondary | ICD-10-CM

## 2012-01-14 DIAGNOSIS — C3490 Malignant neoplasm of unspecified part of unspecified bronchus or lung: Secondary | ICD-10-CM | POA: Insufficient documentation

## 2012-01-14 DIAGNOSIS — C771 Secondary and unspecified malignant neoplasm of intrathoracic lymph nodes: Secondary | ICD-10-CM | POA: Insufficient documentation

## 2012-01-14 HISTORY — PX: MEDIASTINOSCOPY: SHX5086

## 2012-01-14 HISTORY — PX: VIDEO BRONCHOSCOPY WITH ENDOBRONCHIAL ULTRASOUND: SHX6177

## 2012-01-14 SURGERY — BRONCHOSCOPY, WITH EBUS
Anesthesia: General | Site: Chest

## 2012-01-14 MED ORDER — PHENYLEPHRINE HCL 10 MG/ML IJ SOLN
INTRAMUSCULAR | Status: DC | PRN
  Administered 2012-01-14: 40 ug via INTRAVENOUS
  Administered 2012-01-14 (×5): 80 ug via INTRAVENOUS

## 2012-01-14 MED ORDER — HYDROMORPHONE HCL PF 1 MG/ML IJ SOLN
0.2500 mg | INTRAMUSCULAR | Status: DC | PRN
  Administered 2012-01-14: 0.25 mg via INTRAVENOUS

## 2012-01-14 MED ORDER — OXYCODONE HCL 5 MG/5ML PO SOLN: 5.0000 mg | Freq: Once | ORAL | Status: DC | PRN

## 2012-01-14 MED ORDER — HEMOSTATIC AGENTS (NO CHARGE) OPTIME
TOPICAL | Status: DC | PRN
  Administered 2012-01-14: 1 via TOPICAL

## 2012-01-14 MED ORDER — MIDAZOLAM HCL 5 MG/5ML IJ SOLN
INTRAMUSCULAR | Status: DC | PRN
  Administered 2012-01-14: 2 mg via INTRAVENOUS

## 2012-01-14 MED ORDER — DEXTROSE 5 % IV SOLN
10.0000 mg | INTRAVENOUS | Status: DC | PRN
  Administered 2012-01-14: 10 ug/min via INTRAVENOUS
  Administered 2012-01-14: 5 ug/min via INTRAVENOUS

## 2012-01-14 MED ORDER — 0.9 % SODIUM CHLORIDE (POUR BTL) OPTIME
TOPICAL | Status: DC | PRN
  Administered 2012-01-14: 1000 mL

## 2012-01-14 MED ORDER — PROPOFOL 10 MG/ML IV BOLUS
INTRAVENOUS | Status: DC | PRN
  Administered 2012-01-14: 200 mg via INTRAVENOUS

## 2012-01-14 MED ORDER — HYDROMORPHONE HCL PF 1 MG/ML IJ SOLN
INTRAMUSCULAR | Status: AC
  Filled 2012-01-14: qty 1

## 2012-01-14 MED ORDER — LACTATED RINGERS IV SOLN
INTRAVENOUS | Status: DC | PRN
  Administered 2012-01-14 (×2): via INTRAVENOUS

## 2012-01-14 MED ORDER — ROCURONIUM BROMIDE 100 MG/10ML IV SOLN
INTRAVENOUS | Status: DC | PRN
  Administered 2012-01-14: 40 mg via INTRAVENOUS

## 2012-01-14 MED ORDER — ARTIFICIAL TEARS OP OINT
TOPICAL_OINTMENT | OPHTHALMIC | Status: DC | PRN
  Administered 2012-01-14: 1 via OPHTHALMIC

## 2012-01-14 MED ORDER — OXYCODONE HCL 5 MG PO TABS: 5.0000 mg | ORAL_TABLET | Freq: Once | ORAL | Status: DC | PRN

## 2012-01-14 MED ORDER — FENTANYL CITRATE 0.05 MG/ML IJ SOLN
INTRAMUSCULAR | Status: DC | PRN
  Administered 2012-01-14: 50 ug via INTRAVENOUS
  Administered 2012-01-14: 100 ug via INTRAVENOUS
  Administered 2012-01-14 (×5): 50 ug via INTRAVENOUS
  Administered 2012-01-14: 100 ug via INTRAVENOUS

## 2012-01-14 MED ORDER — LIDOCAINE HCL (CARDIAC) 20 MG/ML IV SOLN
INTRAVENOUS | Status: DC | PRN
  Administered 2012-01-14: 80 mg via INTRAVENOUS

## 2012-01-14 MED ORDER — ONDANSETRON HCL 4 MG/2ML IJ SOLN
INTRAMUSCULAR | Status: DC | PRN
  Administered 2012-01-14: 4 mg via INTRAVENOUS

## 2012-01-14 MED ORDER — PROMETHAZINE HCL 25 MG/ML IJ SOLN: 6.2500 mg | INTRAMUSCULAR | Status: DC | PRN

## 2012-01-14 SURGICAL SUPPLY — 59 items
ADH SKN CLS APL DERMABOND .7 (GAUZE/BANDAGES/DRESSINGS) ×1
BALL CTTN LRG ABS STRL LF (GAUZE/BANDAGES/DRESSINGS)
BLADE SURG 10 STRL SS (BLADE) ×2 IMPLANT
BRUSH CYTOL CELLEBRITY 1.5X140 (MISCELLANEOUS) IMPLANT
CANISTER SUCTION 2500CC (MISCELLANEOUS) ×2 IMPLANT
CLIP TI MEDIUM 6 (CLIP) ×1 IMPLANT
CLOTH BEACON ORANGE TIMEOUT ST (SAFETY) ×2 IMPLANT
CONT SPEC 4OZ CLIKSEAL STRL BL (MISCELLANEOUS) ×4 IMPLANT
COTTONBALL LRG STERILE PKG (GAUZE/BANDAGES/DRESSINGS) IMPLANT
COVER SURGICAL LIGHT HANDLE (MISCELLANEOUS) ×4 IMPLANT
COVER TABLE BACK 60X90 (DRAPES) ×2 IMPLANT
DERMABOND ADVANCED (GAUZE/BANDAGES/DRESSINGS) ×1
DERMABOND ADVANCED .7 DNX12 (GAUZE/BANDAGES/DRESSINGS) ×1 IMPLANT
DRAPE LAPAROTOMY T 102X78X121 (DRAPES) ×2 IMPLANT
ELECT CAUTERY BLADE 6.4 (BLADE) ×2 IMPLANT
ELECT REM PT RETURN 9FT ADLT (ELECTROSURGICAL) ×2
ELECTRODE REM PT RTRN 9FT ADLT (ELECTROSURGICAL) ×1 IMPLANT
FORCEPS BIOP RJ4 1.8 (CUTTING FORCEPS) IMPLANT
GAUZE SPONGE 4X4 16PLY XRAY LF (GAUZE/BANDAGES/DRESSINGS) ×2 IMPLANT
GLOVE BIO SURGEON STRL SZ 6.5 (GLOVE) ×4 IMPLANT
GLOVE BIOGEL PI IND STRL 7.0 (GLOVE) IMPLANT
GLOVE BIOGEL PI INDICATOR 7.0 (GLOVE) ×2
GOWN STRL NON-REIN LRG LVL3 (GOWN DISPOSABLE) ×5 IMPLANT
HEMOSTAT SURGICEL 2X14 (HEMOSTASIS) IMPLANT
KIT BASIN OR (CUSTOM PROCEDURE TRAY) ×2 IMPLANT
KIT ROOM TURNOVER OR (KITS) ×2 IMPLANT
MARKER SKIN DUAL TIP RULER LAB (MISCELLANEOUS) ×2 IMPLANT
NDL BIOPSY TRANSBRONCH 21G (NEEDLE) IMPLANT
NDL BLUNT 18X1 FOR OR ONLY (NEEDLE) IMPLANT
NEEDLE 22X1 1/2 (OR ONLY) (NEEDLE) IMPLANT
NEEDLE BIOPSY TRANSBRONCH 21G (NEEDLE) IMPLANT
NEEDLE BLUNT 18X1 FOR OR ONLY (NEEDLE) IMPLANT
NEEDLE SYS SONOTIP II EBUSTBNA (NEEDLE) ×1 IMPLANT
NS IRRIG 1000ML POUR BTL (IV SOLUTION) ×2 IMPLANT
OIL SILICONE PENTAX (PARTS (SERVICE/REPAIRS)) ×1 IMPLANT
PACK SURGICAL SETUP 50X90 (CUSTOM PROCEDURE TRAY) ×2 IMPLANT
PAD ARMBOARD 7.5X6 YLW CONV (MISCELLANEOUS) ×4 IMPLANT
PENCIL BUTTON HOLSTER BLD 10FT (ELECTRODE) ×2 IMPLANT
SPONGE GAUZE 4X4 12PLY (GAUZE/BANDAGES/DRESSINGS) ×2 IMPLANT
SPONGE INTESTINAL PEANUT (DISPOSABLE) ×1 IMPLANT
STAPLER VISISTAT 35W (STAPLE) IMPLANT
SUT SILK 3 0 (SUTURE) ×4
SUT SILK 3-0 18XBRD TIE 12 (SUTURE) IMPLANT
SUT VIC AB 3-0 SH 18 (SUTURE) ×2 IMPLANT
SUT VICRYL 4-0 PS2 18IN ABS (SUTURE) ×2 IMPLANT
SWAB COLLECTION DEVICE MRSA (MISCELLANEOUS) IMPLANT
SYR 20ML ECCENTRIC (SYRINGE) ×2 IMPLANT
SYR 5ML LUER SLIP (SYRINGE) ×2 IMPLANT
SYR CONTROL 10ML LL (SYRINGE) IMPLANT
SYRINGE 10CC LL (SYRINGE) ×2 IMPLANT
TOWEL OR 17X24 6PK STRL BLUE (TOWEL DISPOSABLE) ×2 IMPLANT
TOWEL OR 17X26 10 PK STRL BLUE (TOWEL DISPOSABLE) ×2 IMPLANT
TRAP SPECIMEN MUCOUS 40CC (MISCELLANEOUS) ×2 IMPLANT
TUBE ANAEROBIC SPECIMEN COL (MISCELLANEOUS) IMPLANT
TUBE CONNECTING 12X1/4 (SUCTIONS) ×3 IMPLANT
VALVE BIOPSY  SINGLE USE (MISCELLANEOUS)
VALVE BIOPSY SINGLE USE (MISCELLANEOUS) IMPLANT
VALVE SUCTION BRONCHIO DISP (MISCELLANEOUS) IMPLANT
WATER STERILE IRR 1000ML POUR (IV SOLUTION) ×2 IMPLANT

## 2012-01-14 NOTE — Progress Notes (Signed)
                                [address]            [place] [address]          [phone]                                Roger Mckinney [facility] Medical Record #[id number] Date of Birth: 07/21/1960   Referring: Dr Mohammad Primary Care: Pcp Not In System   Chief Complaint:    Lung Cancer     History of Present Illness:     Stage IIB/IIIa non-small cell lung cancer, adenocarcinoma with a large left perihilar mass and questionable mediastinal lymphadenopathy diagnosed in July of 2013    Neoadjuvant chemotherapy with cisplatin 75 mg/M2 and Alimta 500 mg/M2 given every 3 weeks. The patient is status post 3 cycles. He notes the most recent treatment was more difficulty for him.            Current Activity/ Functional Status: Patient is  independent with mobility/ambulation, transfers, ADL's, IADL's.    Past Medical History   Diagnosis  Date   .  PNA (pneumonia)     .  Lung mass     .  Mitral valve prolapse     .  Asthma         as a child   .  GERD (gastroesophageal reflux disease)         hx of    .  HPV (human papilloma virus) infection         hx of   .  Cancer  2013   .  Dental disease  2013  September       lower teeth removed.   bone graft          Past Surgical History   Procedure  Date   .  Appendectomy     .  Tonsillectomy           Family History   Problem  Relation  Age of Onset   .  Diabetes  Mother     .  Breast cancer  Sister     .  Breast cancer  Sister     .  Lung cancer  Father         was a smoker   .  Colon cancer  Paternal Grandmother     .  Lung cancer  Paternal Grandmother         never a smoker   .  Lupus  Sister           History       Social History   .  Marital Status:  Married       Spouse Name:  N/A       Number of Children:  N/A   .  Years of Education:  N/A       Occupational History   .  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  Smoking status:  Former Smoker --  1.0 packs/day for 30 years       Types:  Cigarettes       Quit date:  09/20/2011   .  Smokeless tobacco:  Never Used   .    Alcohol Use:  No   .  Drug Use:  No         Comment: occassional    .  Sexually Active:  Not on file       Other Topics  Concern   .  Not on file       Social History Narrative   .  No narrative on file         History   Smoking status   .  Former Smoker -- 1.0 packs/day for 30 years   .  Types:  Cigarettes   .  Quit date:  09/20/2011   Smokeless tobacco   .  Never Used       History   Alcohol Use  No           Allergies   Allergen  Reactions   .  Penicillins  Other (See Comments)       Unknown....as an infant         Current Facility-Administered Medications   Medication  Dose  Route  Frequency  Provider  Last Rate  Last Dose   .  vancomycin (VANCOCIN) IVPB 1000 mg/200 mL premix   1,000 mg  Intravenous  60 min Pre-Op  Tamela Elsayed B Anneli Bing, MD             Facility-Administered Medications Ordered in Other Encounters   Medication  Dose  Route  Frequency  Provider  Last Rate  Last Dose   .  [COMPLETED] albuterol (PROVENTIL) (5 MG/ML) 0.5% nebulizer solution 2.5 mg   2.5 mg  Nebulization  Once  Dejia Ebron B Tamber Burtch, MD     2.5 mg at 01/13/12 0951         Prescriptions prior to admission   Medication  Sig  Dispense  Refill   .  baclofen (LIORESAL) 10 MG tablet  Take 10-20 mg by mouth every 8 (eight) hours as needed. For hiccups         .  benzonatate (TESSALON) 200 MG capsule  Take 200 mg by mouth 3 (three) times daily as needed. For cough         .  dexamethasone (DECADRON) 4 MG tablet  Take 4 mg by mouth 2 (two) times daily with a meal. Take the day before, day of & day after chemo therapy         .  fluconazole (DIFLUCAN) 100 MG tablet  Take 100 mg by mouth daily.         .  LORazepam (ATIVAN) 0.5 MG tablet  Place 0.5 mg under the tongue every 8 (eight) hours. For anxiety/nausea         .  Multiple Vitamin (MULTIVITAMIN WITH MINERALS) TABS   Take 1 tablet by mouth daily.         .  Oxycodone HCl 10 MG TABS  Take 5-10 mg by mouth every 6 (six) hours as needed. For pain         .  prochlorperazine (COMPAZINE) 10 MG tablet  Take 10 mg by mouth every 6 (six) hours as needed. For nausea         .  promethazine (PHENERGAN) 25 MG suppository  Place 25 mg rectally every 6 (six) hours as needed. For nausea                Review of Systems:                Cardiac Review of Systems: Y or   N             Chest Pain [ n   ]        Resting SOB [ n  ]      Exertional SOB  [y  ]   Orthopnea [n  ]             Pedal Edema [ n  ]     Palpitations [n  ]          Syncope  [ n ]             Presyncope [ n  ]             General Review of Systems: [Y] = yes [  ]=no Constitional: recent weight change [ y ]; anorexia [y  ]; fatigue [ y ]; nausea [y  ]; night sweats [  ]; fever [ n ]; or chills [  ];                                                                                                                                           Dental: poor dentition[ n ];               Eye : blurred vision [  ]; diplopia [   ]; vision changes [  ];  Amaurosis fugax[  ]; Resp: cough [  ];  wheezing[ n ];  hemoptysis[n  ]; shortness of breath[ y ]; paroxysmal nocturnal dyspnean[  ]; dyspnea on exertion[  ]; or orthopnea[  ];   GI:  gallstones[  ], vomiting[  ];  dysphagia[  ]; melena[  ];  hematochezia [  ]; heartburn[  ];   Hx of  Colonoscopy[ y ]; GU: kidney stones [  ]; hematuria[  ];   dysuria [  ];  nocturia[  ];  history of     obstruction [  ];             Skin: rash, swelling[  ];, hair loss[  ];  peripheral edema[  ];  or itching[  ]; Musculosketetal: myalgias[  ];  joint swelling[  ];  joint erythema[  ];  joint pain[  ];  back pain[  ];             Heme/Lymph: bruising[n  ];  bleeding[n  ];  anemia[n  ];   Neuro: TIA[ n ];  headaches[  ];  stroke[  ];  vertigo[  ];  seizures[  ];   paresthesias[  ];  difficulty walking[  ];              Psych:depression[ n ]; anxiety[n  ];             Endocrine: diabetes[  ];  thyroid dysfunction[  ];             Immunizations:   Flu [  ]; Pneumococcal[  ];             Other:   Physical Exam: BP 154/103  Pulse 87  Temp 98.5 F (36.9 C) (Oral)  Resp 18  SpO2 96%   General appearance: alert, cooperative, appears stated age and fatigued Neurologic: intact Heart: regular rate and rhythm, S1, S2 normal, no murmur, click, rub or gallop and normal apical impulse Lungs: clear to auscultation bilaterally and normal percussion bilaterally Abdomen: soft, non-tender; bowel sounds normal; no masses,  no organomegaly Extremities: extremities normal, atraumatic, no cyanosis or edema and Homans sign is negative, no sign of DVT     Diagnostic Studies & Laboratory data:     Recent Radiology Findings:  Ct Chest W Contrast   12/23/2011  *RADIOLOGY REPORT*  Clinical Data: restaging lung cancer  CT CHEST WITH CONTRAST  Technique:  Multidetector CT imaging of the chest was performed following the standard protocol during bolus administration of intravenous contrast.  Contrast: 80mL OMNIPAQUE IOHEXOL 300 MG/ML  SOLN  Comparison: 10/11/2011  Findings: No axillary or supraclavicular adenopathy.  AP window lymph node measures 1 cm, image 22.  Previously 1.2 cm.  Subcarinal lymph node measures 0.8 cm, image 26.  Previously 1.3 cm.  No pericardial effusion.  Moderate changes of emphysema.  There is no pleural effusion.  Left lower lobe perihilar mass measures 3.7 x 4.9 cm, image 29. Previously 5.3 x 5.9 cm.  Small nodule in the right upper lobe is stable measuring 3 mm, image 20.  The left upper lobe nodule is stable measuring 3.2 mm, image 20.  No new or progressive disease identified.  Upper abdominal imaging demonstrates no mass or adenopathy.  Right posterior rib fracture deformities are again noted.  IMPRESSION:  1.  There has been interval response to therapy.  The left lower lobe perihilar mass has decreased  in size from previous exam. Interval improvement in mediastinal adenopathy.  Original Report Authenticated By: TAYLOR H. STROUD, M.D.     Nm Pet Image Restag (ps) Skull Base To Thigh   01/01/2012  *RADIOLOGY REPORT*  Clinical Data: Subsequent treatment strategy for lung cancer. Initial disease status to be/3N non-small cell lung cancer.  The patient has undergone neoadjuvant chemotherapy.  NUCLEAR MEDICINE PET SKULL BASE TO THIGH  Fasting Blood Glucose:  106  Technique:  18.4 mCi F-18 FDG was injected intravenously. CT data was obtained and used for attenuation correction and anatomic localization only.  (This was not acquired as a diagnostic CT examination.) Additional exam technical data entered on technologist worksheet.  Comparison:  PET CT scan 10/11/2011, CT scan 12/23/2011.  The  Findings:  Neck: No hypermetabolic lymph nodes in the neck.  There is hypermetabolic lesion within the posterior aspect of the left parotid gland.  This lesion is relatively dense on the CT imaging measuring 12 mm (image 22).  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  Chest:  Large left perihilar mass is decreased in size from comparison PET CT scan measuring 5.8 x 3.5 cm compared to 5.3 x 5.9 cm on prior.  However, the metabolic activity of the lesion is similar with SUV max = 21.9 compared to that SUV max = 18.5.  There is persistent hypermetabolic activity within a subcarinal lymph node with SUV max = 5.2 measuring 9 mm (image 88). Adjacent to the left lower lobe the main pulmonary bronchus is a 7 mm short axis and SUV max =

## 2012-01-14 NOTE — OR Nursing (Signed)
Procedure 1- Video Bronch with Endobronchial Ultrasound ended at 1120.  Procedure 2 Mediastinoscopy started at 1138.

## 2012-01-14 NOTE — Brief Op Note (Cosign Needed)
01/14/2012  12:36 PM  PATIENT:  Roger Mckinney  51 y.o. male  PRE-OPERATIVE DIAGNOSIS:  LUNG MASS, Non small Cell lung Ca  POST-OPERATIVE DIAGNOSIS:  Lung Mass  PROCEDURE:  Procedure(s) (LRB) with comments: VIDEO BRONCHOSCOPY WITH ENDOBRONCHIAL ULTRASOUND (N/A) MEDIASTINOSCOPY (N/A) Biopsy #7 and 4L  Mediastinal lymphnodes  SURGEON:  Surgeon(s) and Role:    * Delight Ovens, MD - Primary      ANESTHESIA:   general  EBL:  Total I/O In: 1000 [I.V.:1000] Out: -   BLOOD ADMINISTERED:none  DRAINS: none   LOCAL MEDICATIONS USED:  NONE  SPECIMEN:  Source of Specimen:   #7 and #4L lymph nodes   DISPOSITION OF SPECIMEN:  PATHOLOGY  COUNTS:  YES   DICTATION: .Other Dictation: Dictation Number   PLAN OF CARE: Discharge to home after PACU  PATIENT DISPOSITION:  PACU - hemodynamically stable.   Delay start of Pharmacological VTE agent (>24hrs) due to surgical blood loss or risk of bleeding: yes  [place]ina  Left main stem  Left lower lobe mass

## 2012-01-14 NOTE — Anesthesia Preprocedure Evaluation (Addendum)
Anesthesia Evaluation  Patient identified by MRN, date of birth, ID band Patient awake    Reviewed: Allergy & Precautions, H&P , NPO status , Patient's Chart, lab work & pertinent test results  History of Anesthesia Complications Negative for: history of anesthetic complications  Airway Mallampati: I TM Distance: >3 FB Neck ROM: full    Dental  (+) Edentulous Lower and Edentulous Upper   Pulmonary asthma , pneumonia -, resolved,  breath sounds clear to auscultation  Pulmonary exam normal       Cardiovascular Rhythm:Regular Rate:Normal     Neuro/Psych negative neurological ROS  negative psych ROS   GI/Hepatic Neg liver ROS, GERD-  Controlled,  Endo/Other  negative endocrine ROS  Renal/GU negative Renal ROS     Musculoskeletal   Abdominal   Peds  Hematology   Anesthesia Other Findings   Reproductive/Obstetrics                          Anesthesia Physical Anesthesia Plan  ASA: III  Anesthesia Plan: General   Post-op Pain Management:    Induction: Intravenous  Airway Management Planned: Oral ETT  Additional Equipment:   Intra-op Plan:   Post-operative Plan: Extubation in OR  Informed Consent: I have reviewed the patients History and Physical, chart, labs and discussed the procedure including the risks, benefits and alternatives for the proposed anesthesia with the patient or authorized representative who has indicated his/her understanding and acceptance.   Dental advisory given  Plan Discussed with: CRNA and Anesthesiologist  Anesthesia Plan Comments:         Anesthesia Quick Evaluation

## 2012-01-14 NOTE — Transfer of Care (Signed)
Immediate Anesthesia Transfer of Care Note  Patient: Roger Mckinney  Procedure(s) Performed: Procedure(s) (LRB) with comments: VIDEO BRONCHOSCOPY WITH ENDOBRONCHIAL ULTRASOUND (N/A) MEDIASTINOSCOPY (N/A)  Patient Location: PACU  Anesthesia Type:General  Level of Consciousness: awake, alert  and oriented  Airway & Oxygen Therapy: Patient Spontanous Breathing and Patient connected to nasal cannula oxygen  Post-op Assessment: Report given to PACU RN, Post -op Vital signs reviewed and stable and Patient moving all extremities  Post vital signs: Reviewed and stable  Complications: No apparent anesthesia complications

## 2012-01-14 NOTE — H&P (Signed)
[address]            [place] [address]          [phone]                                [facility] Medical Record #[id number] Date of Birth: 10-26-1960   Referring: Dr Gwenyth Bouillon Primary Care: Pcp Not In System   Chief Complaint:    Lung Cancer     History of Present Illness:     Stage IIB/IIIa non-small cell lung cancer, adenocarcinoma with a large left perihilar mass and questionable mediastinal lymphadenopathy diagnosed in July of 2013    Neoadjuvant chemotherapy with cisplatin 75 mg/M2 and Alimta 500 mg/M2 given every 3 weeks. The patient is status post 3 cycles. He notes the most recent treatment was more difficulty for him.            Current Activity/ Functional Status: Patient is  independent with mobility/ambulation, transfers, ADL's, IADL's.    Past Medical History   Diagnosis  Date   .  PNA (pneumonia)     .  Lung mass     .  Mitral valve prolapse     .  Asthma         as a child   .  GERD (gastroesophageal reflux disease)         hx of    .  HPV (human papilloma virus) infection         hx of   .  Cancer  2013   .  Dental disease  2013  September       lower teeth removed.   bone graft          Past Surgical History   Procedure  Date   .  Appendectomy     .  Tonsillectomy           Family History   Problem  Relation  Age of Onset   .  Diabetes  Mother     .  Breast cancer  Sister     .  Breast cancer  Sister     .  Lung cancer  Father         was a smoker   .  Colon cancer  Paternal Grandmother     .  Lung cancer  Paternal Grandmother         never a smoker   .  Lupus  Sister           History       Social History   .  Marital Status:  Married       Spouse Name:  N/A       Number of Children:  N/A   .  Years of Education:  N/A       Occupational History   .  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  Smoking status:  Former Smoker --  1.0 packs/day for 30 years       Types:  Cigarettes       Quit date:  09/20/2011   .  Smokeless tobacco:  Never Used   .  Alcohol Use:  No   .  Drug Use:  No         Comment: occassional    .  Sexually Active:  Not on file       Other Topics  Concern   .  Not on file       Social History Narrative   .  No narrative on file         History   Smoking status   .  Former Smoker -- 1.0 packs/day for 30 years   .  Types:  Cigarettes   .  Quit date:  09/20/2011   Smokeless tobacco   .  Never Used       History   Alcohol Use  No           Allergies   Allergen  Reactions   .  Penicillins  Other (See Comments)       Unknown....as an infant         Current Facility-Administered Medications   Medication  Dose  Route  Frequency  Provider  Last Rate  Last Dose   .  vancomycin (VANCOCIN) IVPB 1000 mg/200 mL premix   1,000 mg  Intravenous  60 min Pre-Op  Delight Ovens, MD             Facility-Administered Medications Ordered in Other Encounters   Medication  Dose  Route  Frequency  Provider  Last Rate  Last Dose   .  [COMPLETED] albuterol (PROVENTIL) (5 MG/ML) 0.5% nebulizer solution 2.5 mg   2.5 mg  Nebulization  Once  Delight Ovens, MD     2.5 mg at 01/13/12 2130         Prescriptions prior to admission   Medication  Sig  Dispense  Refill   .  baclofen (LIORESAL) 10 MG tablet  Take 10-20 mg by mouth every 8 (eight) hours as needed. For hiccups         .  benzonatate (TESSALON) 200 MG capsule  Take 200 mg by mouth 3 (three) times daily as needed. For cough         .  dexamethasone (DECADRON) 4 MG tablet  Take 4 mg by mouth 2 (two) times daily with a meal. Take the day before, day of & day after chemo therapy         .  fluconazole (DIFLUCAN) 100 MG tablet  Take 100 mg by mouth daily.         Marland Kitchen  LORazepam (ATIVAN) 0.5 MG tablet  Place 0.5 mg under the tongue every 8 (eight) hours. For anxiety/nausea         .  Multiple Vitamin (MULTIVITAMIN WITH MINERALS) TABS   Take 1 tablet by mouth daily.         .  Oxycodone HCl 10 MG TABS  Take 5-10 mg by mouth every 6 (six) hours as needed. For pain         .  prochlorperazine (COMPAZINE) 10 MG tablet  Take 10 mg by mouth every 6 (six) hours as needed. For nausea         .  promethazine (PHENERGAN) 25 MG suppository  Place 25 mg rectally every 6 (six) hours as needed. For nausea                Review of Systems:                Cardiac Review of Systems: Y or  N             Chest Pain [ n   ]        Resting SOB [ n  ]      Exertional SOB  Cove.Etienne  ]   Orthopnea Milo.Brash  ]             Pedal Edema [ n  ]     Palpitations Milo.Brash  ]          Syncope  [ n ]             Presyncope [ n  ]             General Review of Systems: [Y] = yes [  ]=no Constitional: recent weight change [ y ]; anorexia Cove.Etienne  ]; fatigue [ y ]; nausea Cove.Etienne  ]; night sweats [  ]; fever [ n ]; or chills [  ];                                                                                                                                           Dental: poor dentition[ n ];               Eye : blurred vision [  ]; diplopia [   ]; vision changes [  ];  Amaurosis fugax[  ]; Resp: cough [  ];  wheezing[ n ];  hemoptysis[n  ]; shortness of breath[ y ]; paroxysmal nocturnal dyspnean[  ]; dyspnea on exertion[  ]; or orthopnea[  ];   GI:  gallstones[  ], vomiting[  ];  dysphagia[  ]; melena[  ];  hematochezia [  ]; heartburn[  ];   Hx of  Colonoscopy[ y ]; GU: kidney stones [  ]; hematuria[  ];   dysuria [  ];  nocturia[  ];  history of     obstruction [  ];             Skin: rash, swelling[  ];, hair loss[  ];  peripheral edema[  ];  or itching[  ]; Musculosketetal: myalgias[  ];  joint swelling[  ];  joint erythema[  ];  joint pain[  ];  back pain[  ];             Heme/Lymph: bruising[n  ];  bleeding[n  ];  anemia[n  ];   Neuro: TIA[ n ];  headaches[  ];  stroke[  ];  vertigo[  ];  seizures[  ];   paresthesias[  ];  difficulty walking[  ];              Psych:depression[ n ]; anxiety[n  ];             Endocrine: diabetes[  ];  thyroid dysfunction[  ];             Immunizations:  Flu [  ]; Pneumococcal[  ];             Other:   Physical Exam: BP 154/103  Pulse 87  Temp 98.5 F (36.9 C) (Oral)  Resp 18  SpO2 96%   General appearance: alert, cooperative, appears stated age and fatigued Neurologic: intact Heart: regular rate and rhythm, S1, S2 normal, no murmur, click, rub or gallop and normal apical impulse Lungs: clear to auscultation bilaterally and normal percussion bilaterally Abdomen: soft, non-tender; bowel sounds normal; no masses,  no organomegaly Extremities: extremities normal, atraumatic, no cyanosis or edema and Homans sign is negative, no sign of DVT     Diagnostic Studies & Laboratory data:     Recent Radiology Findings:  Ct Chest W Contrast   12/23/2011  *RADIOLOGY REPORT*  Clinical Data: restaging lung cancer  CT CHEST WITH CONTRAST  Technique:  Multidetector CT imaging of the chest was performed following the standard protocol during bolus administration of intravenous contrast.  Contrast: 80mL OMNIPAQUE IOHEXOL 300 MG/ML  SOLN  Comparison: 10/11/2011  Findings: No axillary or supraclavicular adenopathy.  AP window lymph node measures 1 cm, image 22.  Previously 1.2 cm.  Subcarinal lymph node measures 0.8 cm, image 26.  Previously 1.3 cm.  No pericardial effusion.  Moderate changes of emphysema.  There is no pleural effusion.  Left lower lobe perihilar mass measures 3.7 x 4.9 cm, image 29. Previously 5.3 x 5.9 cm.  Small nodule in the right upper lobe is stable measuring 3 mm, image 20.  The left upper lobe nodule is stable measuring 3.2 mm, image 20.  No new or progressive disease identified.  Upper abdominal imaging demonstrates no mass or adenopathy.  Right posterior rib fracture deformities are again noted.  IMPRESSION:  1.  There has been interval response to therapy.  The left lower lobe perihilar mass has decreased  in size from previous exam. Interval improvement in mediastinal adenopathy.  Original Report Authenticated By: Rosealee Albee, M.D.     Nm Pet Image Restag (ps) Skull Base To Thigh   01/01/2012  *RADIOLOGY REPORT*  Clinical Data: Subsequent treatment strategy for lung cancer. Initial disease status to be/3N non-small cell lung cancer.  The patient has undergone neoadjuvant chemotherapy.  NUCLEAR MEDICINE PET SKULL BASE TO THIGH  Fasting Blood Glucose:  106  Technique:  18.4 mCi F-18 FDG was injected intravenously. CT data was obtained and used for attenuation correction and anatomic localization only.  (This was not acquired as a diagnostic CT examination.) Additional exam technical data entered on technologist worksheet.  Comparison:  PET CT scan 10/11/2011, CT scan 12/23/2011.  The  Findings:  Neck: No hypermetabolic lymph nodes in the neck.  There is hypermetabolic lesion within the posterior aspect of the left parotid gland.  This lesion is relatively dense on the CT imaging measuring 12 mm (image 22).  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  Chest:  Large left perihilar mass is decreased in size from comparison PET CT scan measuring 5.8 x 3.5 cm compared to 5.3 x 5.9 cm on prior.  However, the metabolic activity of the lesion is similar with SUV max = 21.9 compared to that SUV max = 18.5.  There is persistent hypermetabolic activity within a subcarinal lymph node with SUV max = 5.2 measuring 9 mm (image 88). Adjacent to the left lower lobe the main pulmonary bronchus is a 7 mm short axis and SUV max =  15.7 (image 84).  There are no contralateral hypermetabolic nodes.  There are several small pulmonary nodules which appear stable.  For example 3 mm left upper lobe pulmonary nodule (image 82).  3 mm right upper lobe pulmonary nodule (image 79).  Right posterior rib fractures noted.  The  Abdomen/Pelvis:  No abnormal hypermetabolic activity within the liver, pancreas,  adrenal glands, or spleen.  No hypermetabolic lymph nodes in the abdomen or pelvis.  Skeleton:  No focal hypermetabolic activity to suggest skeletal metastasis.  IMPRESSION:  1.  Dominant left hilar mass is decreased in size but the metabolic activity is not decreased. 2. Hypermetabolic metastatic subcarinal lymph node and left peribronchial lymph nodes.  3.  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   Original Report Authenticated By: Genevive Bi, M.D.        Recent Lab Findings: Lab Results   Component  Value  Date     WBC  11.0*  01/13/2012     HGB  12.9*  01/13/2012     HCT  38.4*  01/13/2012     PLT  177  01/13/2012     GLUCOSE  90  01/13/2012     ALT  9  01/13/2012     AST  17  01/13/2012     NA  138  01/13/2012     K  4.8  01/13/2012     CL  104  01/13/2012     CREATININE  0.96  01/13/2012     BUN  5*  01/13/2012     CO2  26  01/13/2012     INR  1.06  01/13/2012            Assessment / Plan:      Stage IIB/IIIa non-small cell lung cancer, adenocarcinoma with a large left perihilar mass and questionable mediastinal lymphadenopathy diagnosed in July of 2013   Ct scan has shown some response to the size of mass but still with hypermetabolic subcarinal nodes. At initial bx  Evaluation of #7 nodes was negative. Asked to see patient to consider resection.    Will obtain PFT's   Plan repeat bronch, EBUS, mediastinoscopy to fully evaluate mediastinum before considering resection . Risks and procedure explained to patient in detail.  The goals risks and alternatives of the planned surgical procedure Bronch, EBUS, Mediastionscopy  have been discussed with the patient in detail. The risks of the procedure including death, infection, stroke, myocardial infarction, bleeding, blood transfusion injure to great vessels or esophagus have all been discussed specifically.  I have quoted [name] a 1 % of perioperative mortality and a  complication rate as high as10%. The patient's questions have been answered.Calum Cormier is willing  to proceed with the planned procedure.               Delight Ovens MD   Beeper [phone] Office [phone] 01/14/2012 8:15 AM

## 2012-01-14 NOTE — OR Nursing (Signed)
Called volunteer desk at 1143 to update family; spoke with Tobie Lords.

## 2012-01-14 NOTE — Anesthesia Postprocedure Evaluation (Signed)
Anesthesia Post Note  Patient: Roger Mckinney  Procedure(s) Performed: Procedure(s) (LRB): VIDEO BRONCHOSCOPY WITH ENDOBRONCHIAL ULTRASOUND (N/A) MEDIASTINOSCOPY (N/A)  Anesthesia type: general  Patient location: PACU  Post pain: Pain level controlled  Post assessment: Patient's Cardiovascular Status Stable  Last Vitals:  Filed Vitals:   01/14/12 1300  BP:   Pulse: 91  Temp:   Resp: 20    Post vital signs: Reviewed and stable  Level of consciousness: sedated  Complications: No apparent anesthesia complications

## 2012-01-14 NOTE — OR Nursing (Signed)
Called volunteer desk at [phone]; spoke with Tobie Lords to notify family of surgery start time.

## 2012-01-14 NOTE — Preoperative (Signed)
Beta Blockers   Reason not to administer Beta Blockers:Not Applicable 

## 2012-01-14 NOTE — Anesthesia Procedure Notes (Signed)
Procedure Name: Intubation Date/Time: 01/14/2012 9:18 AM Performed by: Rossie Muskrat L Pre-anesthesia Checklist: Patient identified, Timeout performed, Emergency Drugs available, Suction available and Patient being monitored Patient Re-evaluated:Patient Re-evaluated prior to inductionOxygen Delivery Method: Circle system utilized Preoxygenation: Pre-oxygenation with 100% oxygen Intubation Type: IV induction Ventilation: Mask ventilation without difficulty Laryngoscope Size: Miller and 2 Grade View: Grade I Tube type: Oral Tube size: 7.5 mm Number of attempts: 1 Airway Equipment and Method: Stylet Placement Confirmation: ETT inserted through vocal cords under direct vision,  breath sounds checked- equal and bilateral and positive ETCO2 Secured at: 22 cm Tube secured with: Tape Dental Injury: Teeth and Oropharynx as per pre-operative assessment

## 2012-01-15 ENCOUNTER — Encounter (HOSPITAL_COMMUNITY): Payer: Self-pay | Admitting: Cardiothoracic Surgery

## 2012-01-15 NOTE — Op Note (Signed)
NAMEKURON, DOCKEN NO.:  [id number]  MEDICAL RECORD NO.:  [id number]  LOCATION:  [facility]                         FACILITY:  [facility]  PHYSICIAN:  Sheliah Plane, MD    DATE OF BIRTH:  03-13-1960  DATE OF PROCEDURE:  01/14/2012 DATE OF DISCHARGE:  01/14/2012                              OPERATIVE REPORT   PREOPERATIVE DIAGNOSIS:  Non-small-cell lung cancer, question stage IIIA.  POSTOPERATIVE DIAGNOSIS:  Non-small-cell lung cancer, question stage IIIA.  Final path pending.  PROCEDURE PERFORMED:  Video bronchoscopy.  EBUS with transbronchial biopsy of 4 L and 7 lymph nodes.  Mediastinoscopy with mediastinal node biopsy.  SURGEON:  Sheliah Plane, MD  BRIEF HISTORY:  The patient is a 51 year old male who in late July was diagnosed with non-small cell lung cancer with a large left lung mass, and question of mediastinal involvement on PET scan, clinically stage IIIA.  He was treated with 3 cycles of chemotherapy with some reduction in size of the mass and is now referred for consideration of surgical resection prior to any consideration of resection particularly since it appears from the scans that it may require pneumonectomy.  Adequate mediastinal staging was recommended to the patient, who agreed and signed informed consent.  DESCRIPTION OF PROCEDURE:  The patient underwent general endotracheal anesthesia without incident.  Through the endotracheal tube, a video bronchoscope was passed.  There were no lesions in the right mainstem or subsegmental lesions on the right.  The scope was then passed into the left mainstem bronchus without any obvious lesions.  The left upper lobe bronchus was clear of lesion.  As the scope was passed into the lower lobe bronchus at the takeoff of the superior segment of the left lower lobe was a partially obstructing mass lesion.  To avoid any bleeding as we were proceeding with biopsies with the ultrasound scope and since  we already have a tissue diagnosis, biopsies of this mass were not done. The scope was then removed and the EBUS scope was placed, easily identifiable #7 and 4 L lymph nodes were identified and multiple passes with needle biopsy were obtained both from the 7 area and the 4 L area. Initial quick smears of these lesions revealed no malignancy.  The scopes were removed.  The patient was prepped and draped.  We proceeded with mediastinoscopy.  Incision was made in the suprasternal notch and carried down to the pretracheal fascia.  Dissection bluntly down the pretracheal fascia to the mediastinal carinal nodes.  These nodes were identified, were slightly enlarged.  Multiple biopsies were obtained. Initial biopsy and frozen section was negative for tumor.  The remainder of the biopsies were sent for permanent section.  With operative field hemostatic, the scope was removed.  The incision was closed with interrupted 3-0 Vicryl, running 4-0 subcuticular stitch in skin edges, and Dermabond was placed.  The patient was extubated in the operating room and transferred to the recovery room for postoperative care having tolerated the procedure without obvious complications.  We will have him return to the office in approximately 2- 3 days to review the pathologic findings and make further recommendations.     Sheliah Plane,  MD     EG/MEDQ  D:  01/14/2012  T:  01/15/2012  Job:  [id number]

## 2012-01-16 ENCOUNTER — Ambulatory Visit (INDEPENDENT_AMBULATORY_CARE_PROVIDER_SITE_OTHER): Payer: BC Managed Care – PPO | Admitting: Cardiothoracic Surgery

## 2012-01-16 ENCOUNTER — Encounter: Payer: Self-pay | Admitting: Cardiothoracic Surgery

## 2012-01-16 VITALS — BP 149/92 | HR 85 | Resp 16 | Ht 70.0 in | Wt 202.0 lb

## 2012-01-16 DIAGNOSIS — Z09 Encounter for follow-up examination after completed treatment for conditions other than malignant neoplasm: Secondary | ICD-10-CM

## 2012-01-16 DIAGNOSIS — Z712 Person consulting for explanation of examination or test findings: Secondary | ICD-10-CM

## 2012-01-16 DIAGNOSIS — R590 Localized enlarged lymph nodes: Secondary | ICD-10-CM

## 2012-01-16 DIAGNOSIS — R599 Enlarged lymph nodes, unspecified: Secondary | ICD-10-CM

## 2012-01-16 DIAGNOSIS — C3490 Malignant neoplasm of unspecified part of unspecified bronchus or lung: Secondary | ICD-10-CM

## 2012-01-16 DIAGNOSIS — Z7189 Other specified counseling: Secondary | ICD-10-CM

## 2012-01-16 NOTE — Progress Notes (Signed)
[address]            [place] [address]          [phone]       Roger Mckinney [facility] Medical Record #[id number] Date of Birth: 08/17/1960  Si Gaul, MD Pcp Not In System  Chief Complaint:   PostOp Follow Up Visit Stage IIIa non small cell cancer  History of Present Illness:     Patient returns today after bronchoscopy nevus and mediastinoscopy done 2 days ago to discuss the operative findings and path results.   History  Smoking status  . Former Smoker -- 1.0 packs/day for 30 years  . Types: Cigarettes  . Quit date: 09/20/2011  Smokeless tobacco  . Never Used       Allergies  Allergen Reactions  . Penicillins Other (See Comments)    Unknown....as an infant    Current Outpatient Prescriptions  Medication Sig Dispense Refill  . diphenhydrAMINE (BENADRYL) 50 MG capsule Take 50 mg by mouth every 6 (six) hours as needed.      . baclofen (LIORESAL) 10 MG tablet Take 10-20 mg by mouth every 8 (eight) hours as needed. For hiccups      . benzonatate (TESSALON) 200 MG capsule Take 200 mg by mouth 3 (three) times daily as needed. For cough      . dexamethasone (DECADRON) 4 MG tablet Take 4 mg by mouth 2 (two) times daily with a meal. Take the day before, day of & day after chemo therapy      . fluconazole (DIFLUCAN) 100 MG tablet Take 100 mg by mouth daily.      Marland Kitchen LORazepam (ATIVAN) 0.5 MG tablet Place 0.5 mg under the tongue every 8 (eight) hours. For anxiety/nausea      . Multiple Vitamin (MULTIVITAMIN WITH MINERALS) TABS Take 1 tablet by mouth daily.      . Oxycodone HCl 10 MG TABS Take 5-10 mg by mouth every 6 (six) hours as needed. For pain      . prochlorperazine (COMPAZINE) 10 MG tablet Take 10 mg by mouth every 6 (six) hours as needed. For nausea      . promethazine (PHENERGAN) 25 MG suppository Place 25 mg rectally every 6 (six) hours as needed. For nausea           Physical Exam: BP 149/92  Pulse 85  Resp  16  Ht 5\' 10"  (1.778 m)  Wt 202 lb (91.627 kg)  BMI 28.98 kg/m2  SpO2 98%  General appearance: alert and cooperative Neurologic: intact Heart: regular rate and rhythm, S1, S2 normal, no murmur, click, rub or gallop and normal apical impulse Lungs: diminished breath sounds LLL Abdomen: soft, non-tender; bowel sounds normal; no masses,  no organomegaly Extremities: extremities normal, atraumatic, no cyanosis or edema and Homans sign is negative, no sign of DVT Wound: His mediastinoscopy wound is well-healed with slight puffiness but no evidence of infection  Diagnostic Studies & Laboratory data:         Recent Radiology Findings: Dg Chest 2 View Within Previous 72 Hours.  Films Obtained On Friday Are Acceptable For Monday And Tuesday Cases  01/13/2012  *RADIOLOGY REPORT*  Clinical Data: Known left hilar mass  CHEST - 2 VIEW  Comparison: 01/01/2012, 12/23/2011.  Findings: The cardiac shadow is stable.  The patients known left hilar mass is again visualized and stable.  No evidence of pneumothorax is seen.  There are changes consistent with a prior rib fracture on the right without significant union.  No sizable effusion is seen.  IMPRESSION: Stable left hilar mass.  No acute abnormality is noted.   Original Report Authenticated By: Alcide Clever, M.D.    Ct Chest W Contrast  12/23/2011  *RADIOLOGY REPORT*  Clinical Data: restaging lung cancer  CT CHEST WITH CONTRAST  Technique:  Multidetector CT imaging of the chest was performed following the standard protocol during bolus administration of intravenous contrast.  Contrast: 80mL OMNIPAQUE IOHEXOL 300 MG/ML  SOLN  Comparison: 10/11/2011  Findings: No axillary or supraclavicular adenopathy.  AP window lymph node measures 1 cm, image 22.  Previously 1.2 cm.  Subcarinal lymph node measures 0.8 cm, image 26.  Previously 1.3 cm.  No pericardial effusion.  Moderate changes of emphysema.  There is no pleural effusion.  Left lower lobe perihilar mass  measures 3.7 x 4.9 cm, image 29. Previously 5.3 x 5.9 cm.  Small nodule in the right upper lobe is stable measuring 3 mm, image 20.  The left upper lobe nodule is stable measuring 3.2 mm, image 20.  No new or progressive disease identified.  Upper abdominal imaging demonstrates no mass or adenopathy.  Right posterior rib fracture deformities are again noted.  IMPRESSION:  1.  There has been interval response to therapy.  The left lower lobe perihilar mass has decreased in size from previous exam. Interval improvement in mediastinal adenopathy.   Original Report Authenticated By: Rosealee Albee, M.D.    Nm Pet Image Restag (ps) Skull Base To Thigh  01/01/2012  *RADIOLOGY REPORT*  Clinical Data: Subsequent treatment strategy for lung cancer. Initial disease status to be/3N non-small cell lung cancer.  The patient has undergone neoadjuvant chemotherapy.  NUCLEAR MEDICINE PET SKULL BASE TO THIGH  Fasting Blood Glucose:  106  Technique:  18.4 mCi F-18 FDG was injected intravenously. CT data was obtained and used for attenuation correction and anatomic localization only.  (This was not acquired as a diagnostic CT examination.) Additional exam technical data entered on technologist worksheet.  Comparison:  PET CT scan 10/11/2011, CT scan 12/23/2011.  The  Findings:  Neck: No hypermetabolic lymph nodes in the neck.  There is hypermetabolic lesion within the posterior aspect of the left parotid gland.  This lesion is relatively dense on the CT imaging measuring 12 mm (image 22).  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  Chest:  Large left perihilar mass is decreased in size from comparison PET CT scan measuring 5.8 x 3.5 cm compared to 5.3 x 5.9 cm on prior.  However, the metabolic activity of the lesion is similar with SUV max = 21.9 compared to that SUV max = 18.5.  There is persistent hypermetabolic activity within a subcarinal lymph node with SUV max = 5.2 measuring 9 mm (image  88). Adjacent to the left lower lobe the main pulmonary bronchus is a 7 mm short axis and SUV max = 15.7 (image 84).  There are no contralateral hypermetabolic nodes.  There are several small pulmonary nodules which appear stable.  For example 3 mm left upper lobe pulmonary nodule (image 82).  3 mm right upper lobe pulmonary nodule (image 79).  Right posterior rib fractures noted.  The  Abdomen/Pelvis:  No abnormal hypermetabolic activity within the liver, pancreas, adrenal glands, or spleen.  No hypermetabolic lymph nodes in the abdomen or pelvis.  Skeleton:  No focal hypermetabolic activity to suggest skeletal metastasis.  IMPRESSION:  1.  Dominant left hilar mass is decreased in size but the metabolic activity is not decreased. 2. Hypermetabolic metastatic subcarinal lymph node and left peribronchial lymph nodes.  3.  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   Original Report Authenticated By: Genevive Bi, M.D.      Recent Labs: Lab Results  Component Value Date   WBC 11.0* 01/13/2012   HGB 12.9* 01/13/2012   HCT 38.4* 01/13/2012   PLT 177 01/13/2012   GLUCOSE 90 01/13/2012   ALT 9 01/13/2012   AST 17 01/13/2012   NA 138 01/13/2012   K 4.8 01/13/2012   CL 104 01/13/2012   CREATININE 0.96 01/13/2012   BUN 5* 01/13/2012   CO2 26 01/13/2012   INR 1.06 01/13/2012      Assessment / Plan:     The path results showed non-small cell lung cancer in the 4L of nodes, the biopsied 7 nodes were negative for tumor. With the patient's stage IIIa status, positive for 4l  Nodes, and probable need for a pneumonectomy to resect the left lung lesion I cannot recommend proceeding with surgical resection. I discussed the pathologic findings with the patient and his wife, their questions have been answered. The patient will see Dr. Shirline Frees to continue with his previously planned treatment.       Steed Kanaan B 01/16/2012 4:22 PM

## 2012-01-20 ENCOUNTER — Telehealth: Payer: Self-pay | Admitting: *Deleted

## 2012-01-20 NOTE — Telephone Encounter (Signed)
Spoke with wife regarding new appt time.  Nov 19th at 9:00 Lab/FC and 10:00 Dr. Arbutus Ped.  She verbalized understanding of time and place

## 2012-01-24 ENCOUNTER — Telehealth: Payer: Self-pay | Admitting: Medical Oncology

## 2012-01-24 NOTE — Telephone Encounter (Signed)
Roger Mckinney called to report pt is " not thriving" . He can drink liquids but can only eat small bites of food .He has thick phlegm -and when he tries to swallow food he is trying to also swallow the phlegm and it is so thick that it makes him cough and he gets  nauseated and he vomits.  This happened 10 times yesterday . He is taking mucinex and tessalon pearls. Per Dr Arbutus Ped pt needs to follow up with Dr Lowella Fairy or go to ED. Wife said she will decide.

## 2012-01-28 ENCOUNTER — Ambulatory Visit (HOSPITAL_BASED_OUTPATIENT_CLINIC_OR_DEPARTMENT_OTHER): Payer: Medicaid Other | Admitting: Internal Medicine

## 2012-01-28 ENCOUNTER — Other Ambulatory Visit (HOSPITAL_BASED_OUTPATIENT_CLINIC_OR_DEPARTMENT_OTHER): Payer: Medicaid Other

## 2012-01-28 ENCOUNTER — Ambulatory Visit: Payer: BC Managed Care – PPO

## 2012-01-28 ENCOUNTER — Telehealth: Payer: Self-pay | Admitting: Internal Medicine

## 2012-01-28 VITALS — BP 143/97 | HR 93 | Temp 98.4°F | Resp 18 | Ht 70.0 in | Wt 201.2 lb

## 2012-01-28 DIAGNOSIS — C3490 Malignant neoplasm of unspecified part of unspecified bronchus or lung: Secondary | ICD-10-CM

## 2012-01-28 DIAGNOSIS — C3430 Malignant neoplasm of lower lobe, unspecified bronchus or lung: Secondary | ICD-10-CM

## 2012-01-28 DIAGNOSIS — C779 Secondary and unspecified malignant neoplasm of lymph node, unspecified: Secondary | ICD-10-CM

## 2012-01-28 LAB — CBC WITH DIFFERENTIAL/PLATELET
BASO%: 0.7 % (ref 0.0–2.0)
EOS%: 3.4 % (ref 0.0–7.0)
HGB: 13.3 g/dL (ref 13.0–17.1)
MCH: 32.7 pg (ref 27.2–33.4)
MCHC: 34.6 g/dL (ref 32.0–36.0)
RDW: 18.7 % — ABNORMAL HIGH (ref 11.0–14.6)
lymph#: 2.1 10*3/uL (ref 0.9–3.3)

## 2012-01-28 LAB — MAGNESIUM (CC13): Magnesium: 2.2 mg/dl (ref 1.5–2.5)

## 2012-01-28 LAB — COMPREHENSIVE METABOLIC PANEL (CC13)
ALT: 8 U/L (ref 0–55)
AST: 15 U/L (ref 5–34)
Calcium: 9.6 mg/dL (ref 8.4–10.4)
Chloride: 105 mEq/L (ref 98–107)
Creatinine: 1.1 mg/dL (ref 0.7–1.3)
Potassium: 4.4 mEq/L (ref 3.5–5.1)
Sodium: 138 mEq/L (ref 136–145)

## 2012-01-28 NOTE — Telephone Encounter (Signed)
gv pt appt schedule for November 2013 thru January 2014. Pt also given appt w/Dr. Roselind Messier

## 2012-01-28 NOTE — Patient Instructions (Signed)
You have diagnosis of a stage IIIa non-small cell lung cancer. I recommended for you and course of concurrent chemoradiation with weekly carboplatin and paclitaxel. First cycle expected on 02/10/2012. Followup in 3 weeks

## 2012-01-28 NOTE — Progress Notes (Signed)
[facility] [phone]   [phone]  OFFICE PROGRESS NOTE  DIAGNOSIS: Stage IIB/IIIa non-small cell lung cancer, adenocarcinoma with a large left perihilar mass and questionable mediastinal lymphadenopathy diagnosed in July of 2013   PRIOR THERAPY: Neoadjuvant chemotherapy with cisplatin 75 mg/M2 and Alimta 500 mg/M2 given every 3 weeks. The patient is status post 3 cycles.   CURRENT THERAPY: None.  INTERVAL HISTORY: Roger Mckinney 51 y.o. male returns to the clinic today for followup visit accompanied by his wife. The patient is feeling fine today with no specific complaints. He was referred to Dr. Tyrone Sage for consideration and evaluation for surgical resection of the residual tumor. The patient underwent mediastinoscopy recently and biopsy of 4L lymph node showed metastatic adenocarcinoma. His only surgical option was complete left pneumonectomy which Dr. Tyrone Sage did not recommend for the patient. He was referred back to me for evaluation and recommendation regarding treatment of his condition. The patient denied having any significant complaints today. He denied having any significant weight loss or night sweats. He has no chest pain, shortness breath, cough or hemoptysis.  MEDICAL HISTORY: Past Medical History  Diagnosis Date  . PNA (pneumonia)   . Lung mass   . Mitral valve prolapse   . Asthma     as a child  . GERD (gastroesophageal reflux disease)     hx of   . HPV (human papilloma virus) infection     hx of  . Cancer 2013  . Dental disease 2013  September    lower teeth removed.   bone graft     ALLERGIES:  is allergic to penicillins.  MEDICATIONS:  Current Outpatient Prescriptions  Medication Sig Dispense Refill  . benzonatate (TESSALON) 200 MG capsule Take 200 mg by mouth 3 (three) times daily as needed. For cough      . dextromethorphan-guaiFENesin (MUCINEX DM) 30-600 MG per 12 hr tablet Take 1 tablet by mouth every 12 (twelve)  hours.      . fluconazole (DIFLUCAN) 100 MG tablet Take 100 mg by mouth daily.      Marland Kitchen LORazepam (ATIVAN) 0.5 MG tablet Place 0.5 mg under the tongue every 8 (eight) hours. For anxiety/nausea      . prochlorperazine (COMPAZINE) 10 MG tablet Take 10 mg by mouth every 6 (six) hours as needed. For nausea      . promethazine (PHENERGAN) 25 MG suppository Place 25 mg rectally every 6 (six) hours as needed. For nausea        SURGICAL HISTORY:  Past Surgical History  Procedure Date  . Appendectomy   . Tonsillectomy   . Video bronchoscopy with endobronchial ultrasound 01/14/2012    Procedure: VIDEO BRONCHOSCOPY WITH ENDOBRONCHIAL ULTRASOUND;  Surgeon: Delight Ovens, MD;  Location: [facility] OR;  Service: Thoracic;  Laterality: N/A;  . Mediastinoscopy 01/14/2012    Procedure: MEDIASTINOSCOPY;  Surgeon: Delight Ovens, MD;  Location: [facility] OR;  Service: Thoracic;  Laterality: N/A;    REVIEW OF SYSTEMS:  A comprehensive review of systems was negative.   PHYSICAL EXAMINATION: General appearance: alert, cooperative and no distress Head: Normocephalic, without obvious abnormality, atraumatic Neck: no adenopathy Lymph nodes: Cervical, supraclavicular, and axillary nodes normal. Resp: clear to auscultation bilaterally Cardio: regular rate and rhythm, S1, S2 normal, no murmur, click, rub or gallop GI: soft, non-tender; bowel sounds normal; no masses,  no organomegaly Extremities: extremities normal, atraumatic, no cyanosis or edema Neurologic: Alert and oriented X 3, normal strength and tone.  Normal symmetric reflexes. Normal coordination and gait  ECOG PERFORMANCE STATUS: 1 - Symptomatic but completely ambulatory  Blood pressure 143/97, pulse 93, temperature 98.4 F (36.9 C), temperature source Oral, resp. rate 18, height 5\' 10"  (1.778 m), weight 201 lb 3.2 oz (91.264 kg).  LABORATORY DATA: Lab Results  Component Value Date   WBC 10.1 01/28/2012   HGB 13.3 01/28/2012   HCT 38.5 01/28/2012   MCV  94.5 01/28/2012   PLT 201 01/28/2012      Chemistry      Component Value Date/Time   NA 138 01/13/2012 0849   NA 138 12/26/2011 0903   K 4.8 01/13/2012 0849   K 4.4 12/26/2011 0903   CL 104 01/13/2012 0849   CL 102 12/26/2011 0903   CO2 26 01/13/2012 0849   CO2 26 12/26/2011 0903   BUN 5* 01/13/2012 0849   BUN 5.0* 12/26/2011 0903   CREATININE 0.96 01/13/2012 0849   CREATININE 1.1 12/26/2011 0903      Component Value Date/Time   CALCIUM 9.8 01/13/2012 0849   CALCIUM 9.8 12/26/2011 0903   ALKPHOS 111 01/13/2012 0849   ALKPHOS 129 12/26/2011 0903   AST 17 01/13/2012 0849   AST 21 12/26/2011 0903   ALT 9 01/13/2012 0849   ALT 41 12/26/2011 0903   BILITOT 0.6 01/13/2012 0849   BILITOT 0.60 12/26/2011 0903       RADIOGRAPHIC STUDIES: Dg Chest 2 View Within Previous 72 Hours.  Films Obtained On Friday Are Acceptable For Monday And Tuesday Cases  01/13/2012  *RADIOLOGY REPORT*  Clinical Data: Known left hilar mass  CHEST - 2 VIEW  Comparison: 01/01/2012, 12/23/2011.  Findings: The cardiac shadow is stable.  The patients known left hilar mass is again visualized and stable.  No evidence of pneumothorax is seen.  There are changes consistent with a prior rib fracture on the right without significant union.  No sizable effusion is seen.  IMPRESSION: Stable left hilar mass.  No acute abnormality is noted.   Original Report Authenticated By: Alcide Clever, M.D.    Nm Pet Image Restag (ps) Skull Base To Thigh  01/01/2012  *RADIOLOGY REPORT*  Clinical Data: Subsequent treatment strategy for lung cancer. Initial disease status to be/3N non-small cell lung cancer.  The patient has undergone neoadjuvant chemotherapy.  NUCLEAR MEDICINE PET SKULL BASE TO THIGH  Fasting Blood Glucose:  106  Technique:  18.4 mCi F-18 FDG was injected intravenously. CT data was obtained and used for attenuation correction and anatomic localization only.  (This was not acquired as a diagnostic CT examination.) Additional exam  technical data entered on technologist worksheet.  Comparison:  PET CT scan 10/11/2011, CT scan 12/23/2011.  The  Findings:  Neck: No hypermetabolic lymph nodes in the neck.  There is hypermetabolic lesion within the posterior aspect of the left parotid gland.  This lesion is relatively dense on the CT imaging measuring 12 mm (image 22).  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  Chest:  Large left perihilar mass is decreased in size from comparison PET CT scan measuring 5.8 x 3.5 cm compared to 5.3 x 5.9 cm on prior.  However, the metabolic activity of the lesion is similar with SUV max = 21.9 compared to that SUV max = 18.5.  There is persistent hypermetabolic activity within a subcarinal lymph node with SUV max = 5.2 measuring 9 mm (image 88). Adjacent to the left lower lobe the main pulmonary bronchus  is a 7 mm short axis and SUV max = 15.7 (image 84).  There are no contralateral hypermetabolic nodes.  There are several small pulmonary nodules which appear stable.  For example 3 mm left upper lobe pulmonary nodule (image 82).  3 mm right upper lobe pulmonary nodule (image 79).  Right posterior rib fractures noted.  The  Abdomen/Pelvis:  No abnormal hypermetabolic activity within the liver, pancreas, adrenal glands, or spleen.  No hypermetabolic lymph nodes in the abdomen or pelvis.  Skeleton:  No focal hypermetabolic activity to suggest skeletal metastasis.  IMPRESSION:  1.  Dominant left hilar mass is decreased in size but the metabolic activity is not decreased. 2. Hypermetabolic metastatic subcarinal lymph node and left peribronchial lymph nodes.  3.  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   Original Report Authenticated By: Genevive Bi, M.D.     ASSESSMENT: This is a very pleasant 51 years old white male with history of stage IIIa non-small cell lung cancer, adenocarcinoma status post 3 cycles of  neoadjuvant chemotherapy with cisplatin and Alimta with partial response of his disease but the patient continues to have residual disease in N2 lymph node. Dr. Tyrone Sage did not recommend surgical resection for him at this point.  PLAN: I have a lengthy discussion with the patient and his wife today about his current disease status and treatment options. I recommended for him a course of concurrent chemoradiation with weekly carboplatin for AUC of 2 and paclitaxel 45 mg/M2 for a total of 6-7 weeks concurrent with radiation. I discussed with the patient adverse effect of the chemotherapy including but not limited to alopecia, myelosuppression, nausea and vomiting, peripheral neuropathy, liver or in dysfunction. I will refer the patient to radiation oncology for evaluation and discussion of the radiotherapy option. I expect the patient to start the first cycle of his concurrent chemoradiation on 02/10/2012. The patient and his wife agreed to proceed with the current plan. He would come back for followup visit in 3 weeks for evaluation and management any adverse effect of his chemotherapy. The patient was advised to call immediately if he has any concerning symptoms in the interval.  All questions were answered. The patient knows to call the clinic with any problems, questions or concerns. We can certainly see the patient much sooner if necessary.  I spent 20 minutes counseling the patient face to face. The total time spent in the appointment was 30 minutes.

## 2012-01-31 ENCOUNTER — Encounter: Payer: Self-pay | Admitting: Radiation Oncology

## 2012-02-03 ENCOUNTER — Ambulatory Visit
Admission: RE | Admit: 2012-02-03 | Discharge: 2012-02-03 | Disposition: A | Discharge status: Home / Self Care | Payer: Medicaid Other | Source: Ambulatory Visit | Attending: Radiation Oncology | Admitting: Radiation Oncology

## 2012-02-03 ENCOUNTER — Encounter: Payer: Self-pay | Admitting: Radiation Oncology

## 2012-02-03 VITALS — BP 147/106 | HR 92 | Temp 98.6°F | Wt 202.7 lb

## 2012-02-03 DIAGNOSIS — C3490 Malignant neoplasm of unspecified part of unspecified bronchus or lung: Secondary | ICD-10-CM | POA: Insufficient documentation

## 2012-02-03 NOTE — Progress Notes (Signed)
Radiation Oncology         [phone] ________________________________  Initial outpatient Consultation  Name: Roger Mckinney MRN: [id number]  Date: 02/03/2012  DOB: Jul 03, 1960  CC:Pcp Not In System  Si Gaul, MD   REFERRING PHYSICIAN: Si Gaul, MD  DIAGNOSIS: The encounter diagnosis was Lung cancer.  HISTORY OF PRESENT ILLNESS::Roger Mckinney is a 51 y.o. male who is  seen out of the courtesy of Dr. Arbutus Ped for an opinion concerning radiation therapy as part of management of patient's stage III non-small cell lung cancer.   earlier this year the patient was diagnosed with adenocarcinoma of the lung. He presented with a large left perihilar mass and questionable mediastinal of adenopathy.   the patient was treated with neoadjuvant chemotherapy consisting of cis-platinum and Alimta for 3 cycles.  He underwent reimaging with tumor shrinkage noted.  Patient was seen by Dr. Tyrone Sage and a biopsy from the L4 area revealed malignancy. For complete surgical removal the patient would require a left pneumonectomy. This was not felt to be a good approach with this patient and it was recommended he proceed with radiation and radiosensitizing chemotherapy.  PREVIOUS RADIATION THERAPY: No  PAST MEDICAL HISTORY:  has a past medical history of PNA (pneumonia); Lung mass; Mitral valve prolapse; Asthma; GERD (gastroesophageal reflux disease); HPV (human papilloma virus) infection; Cancer (2013); Dental disease (2013  September); and History of chemotherapy.    PAST SURGICAL HISTORY: Past Surgical History  Procedure Date  . Appendectomy   . Tonsillectomy   . Video bronchoscopy with endobronchial ultrasound 01/14/2012    Procedure: VIDEO BRONCHOSCOPY WITH ENDOBRONCHIAL ULTRASOUND;  Surgeon: Delight Ovens, MD;  Location: [facility] OR;  Service: Thoracic;  Laterality: N/A;  . Mediastinoscopy 01/14/2012    Procedure: MEDIASTINOSCOPY;  Surgeon: Delight Ovens, MD;  Location: [facility] OR;  Service:  Thoracic;  Laterality: N/A;    FAMILY HISTORY: family history includes Breast cancer in his sisters; Colon cancer in his paternal grandmother; Diabetes in his mother; Lung cancer in his father and paternal grandmother; and Lupus in his sister.  SOCIAL HISTORY:  reports that he quit smoking about 4 months ago. His smoking use included Cigarettes. He has a 30 pack-year smoking history. He has never used smokeless tobacco. He reports that he does not drink alcohol or use illicit drugs.  ALLERGIES: Penicillins  MEDICATIONS:  Current Outpatient Prescriptions  Medication Sig Dispense Refill  . benzonatate (TESSALON) 200 MG capsule Take 200 mg by mouth 3 (three) times daily as needed. For cough      . dextromethorphan-guaiFENesin (MUCINEX DM) 30-600 MG per 12 hr tablet Take 1 tablet by mouth every 12 (twelve) hours.      Marland Kitchen LORazepam (ATIVAN) 0.5 MG tablet Place 0.5 mg under the tongue every 8 (eight) hours. For anxiety/nausea      . prochlorperazine (COMPAZINE) 10 MG tablet Take 10 mg by mouth every 6 (six) hours as needed. For nausea      . promethazine (PHENERGAN) 25 MG suppository Place 25 mg rectally every 6 (six) hours as needed. For nausea      . fluconazole (DIFLUCAN) 100 MG tablet Take 100 mg by mouth daily.        REVIEW OF SYSTEMS:  A 15 point review of systems is documented in the electronic medical record. This was obtained by the nursing staff. However, I reviewed this with the patient to discuss relevant findings and make appropriate changes.  He denies any headaches dizziness or blurred vision. Patient denies any  new bony pain.  He does have continued problems with coughing and sometimes handling his secretions. He denies any hemoptysis at this time.  He does complain of fatigue and poor appetite in addition.   PHYSICAL EXAM:  weight is 202 lb 11.2 oz (91.944 kg). His temperature is 98.6 F (37 C). His blood pressure is 147/106 and his pulse is 92. His oxygen saturation is 100%.   BP  147/106  Pulse 92  Temp 98.6 F (37 C)  Wt 202 lb 11.2 oz (91.944 kg)  SpO2 100%  General Appearance:    Alert, cooperative, no distress, appears older than stated age  Head:    Normocephalic, without obvious abnormality, atraumatic  Eyes:    PERRL, conjunctiva/corneas clear, EOM's intact,        Ears:    Normal TM's and external ear canals, both ears  Nose:   Nares normal, septum midline, mucosa normal, no drainage    or sinus tenderness  Throat:   Lips, mucosa, and tongue normal; edentulous gums normal  Neck:   Supple, symmetrical, trachea midline, no adenopathy;       thyroid:  No enlargement/tenderness/nodules; no carotid   bruit or JVD  Back:     Symmetric, no curvature, ROM normal, no CVA tenderness  Lungs:     Clear to auscultation bilaterally, respirations unlabored  Chest wall:    No tenderness or deformity,  scar in the upper sternum area from recent mediastinoscopy   Heart:    Regular rate and rhythm, , no murmur, rub   or gallop  Abdomen:     Soft, non-tender, bowel sounds active all four quadrants,    no masses, no organomegaly        Extremities:   Extremities normal, atraumatic, no cyanosis or edema  Pulses:   2+ and symmetric all extremities  Skin:   Skin color, texture, turgor normal, no rashes or lesions  Lymph nodes:   Cervical, supraclavicular, and axillary nodes normal  Neurologic:   CNII-XII intact. Normal strength, sensation and reflexes      throughout    LABORATORY DATA:  Lab Results  Component Value Date   WBC 10.1 01/28/2012   HGB 13.3 01/28/2012   HCT 38.5 01/28/2012   MCV 94.5 01/28/2012   PLT 201 01/28/2012   Lab Results  Component Value Date   NA 138 01/28/2012   K 4.4 01/28/2012   CL 105 01/28/2012   CO2 25 01/28/2012   Lab Results  Component Value Date   ALT 8 01/28/2012   AST 15 01/28/2012   ALKPHOS 130 01/28/2012   BILITOT 0.68 01/28/2012     RADIOGRAPHY: Dg Chest 2 View Within Previous 72 Hours.  Films Obtained On Friday Are  Acceptable For Monday And Tuesday Cases  01/13/2012  *RADIOLOGY REPORT*  Clinical Data: Known left hilar mass  CHEST - 2 VIEW  Comparison: 01/01/2012, 12/23/2011.  Findings: The cardiac shadow is stable.  The patients known left hilar mass is again visualized and stable.  No evidence of pneumothorax is seen.  There are changes consistent with a prior rib fracture on the right without significant union.  No sizable effusion is seen.  IMPRESSION: Stable left hilar mass.  No acute abnormality is noted.   Original Report Authenticated By: Alcide Clever, M.D.    PET SCAN:   IMPRESSION: 1. Dominant left hilar mass is decreased in size but the metabolic activity is not decreased. 2. Hypermetabolic metastatic subcarinal lymph node and left peribronchial lymph  nodes. 3. 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  As above the patient had a partial response to his neoadjuvant chemotherapy and was not felt to be a good surgical candidate. He would be a good candidate for combination of radiation directed at the central chest and radiosensitizing chemotherapy.   I discussed the overall treatment course side effects and potential toxicities of radiation therapy in this situation with the patient and his wife. The patient appears to understand and wishes to proceed with planned course of treatment.  PLAN: The patient will return later this week for planning with treatments to begin the first week in December. Anticipate a cumulative dose to the target area of approximately 6300 cGy.  I spent 60 minutes minutes face to face with the patient and more than 50% of that time was spent in counseling and/or coordination of care.   ------------------------------------------------   Billie Lade, PhD, MD

## 2012-02-03 NOTE — Progress Notes (Signed)
Please see the Nurse Progress Note in the MD Initial Consult Encounter for this patient. 

## 2012-02-03 NOTE — Progress Notes (Signed)
Patient here for radiation consultation of non-small cell lung cancer.Patient has completed 3 cycles of cisplatin and alimta next scheduled for 02/10/12.

## 2012-02-04 ENCOUNTER — Telehealth: Payer: Self-pay | Admitting: Radiation Oncology

## 2012-02-04 ENCOUNTER — Ambulatory Visit
Admission: RE | Admit: 2012-02-04 | Discharge: 2012-02-04 | Disposition: A | Discharge status: Home / Self Care | Payer: Medicaid Other | Source: Ambulatory Visit | Attending: Radiation Oncology | Admitting: Radiation Oncology

## 2012-02-04 DIAGNOSIS — C3490 Malignant neoplasm of unspecified part of unspecified bronchus or lung: Secondary | ICD-10-CM | POA: Insufficient documentation

## 2012-02-04 DIAGNOSIS — R599 Enlarged lymph nodes, unspecified: Secondary | ICD-10-CM | POA: Insufficient documentation

## 2012-02-04 DIAGNOSIS — L259 Unspecified contact dermatitis, unspecified cause: Secondary | ICD-10-CM | POA: Insufficient documentation

## 2012-02-04 DIAGNOSIS — Z510 Encounter for antineoplastic radiation therapy: Secondary | ICD-10-CM | POA: Insufficient documentation

## 2012-02-04 DIAGNOSIS — Z79899 Other long term (current) drug therapy: Secondary | ICD-10-CM | POA: Insufficient documentation

## 2012-02-04 DIAGNOSIS — K209 Esophagitis, unspecified without bleeding: Secondary | ICD-10-CM | POA: Insufficient documentation

## 2012-02-04 NOTE — Telephone Encounter (Signed)
Met with patient to discuss RO billing.  Patient already qualified for assistance.  Needed gas card.  Issued $50.00 gas card.  AEF balance $51.66.  Made patient aware.

## 2012-02-04 NOTE — Progress Notes (Signed)
  Radiation Oncology         [phone] ________________________________  Name: Roger Mckinney MRN: [id number]  Date: 02/04/2012  DOB: 10-17-1960  SIMULATION AND TREATMENT PLANNING NOTE  DIAGNOSIS: Stage III non-small cell lung  NARRATIVE:  The patient was brought to the CT Simulation planning suite.  Identity was confirmed.  All relevant records and images related to the planned course of therapy were reviewed.  The patient freely provided informed written consent to proceed with treatment after reviewing the details related to the planned course of therapy. The consent form was witnessed and verified by the simulation staff.  Then, the patient was set-up in a stable reproducible  supine position for radiation therapy.  CT images were obtained.  Surface markings were placed.  The CT images were loaded into the planning software.  Then the target and avoidance structures were contoured.  Treatment planning then occurred.  The radiation prescription was entered and confirmed.  A total of 1 complex treatment devices were fabricated. I have requested : 3D Simulation  I have requested a DVH of the following structures: gtv, ptv, esophagus, spinal cord, lungs.  I have ordered:dose calc.  PLAN:  The patient will receive 63.0 Gy in 35 fractions.   Special treatment procedure note:   The patient will be receiving radiosensitizing chemotherapy. Given the increased potential for toxicities as well as the necessity for close monitoring of the patient and blood work, this constitutes a special treatment procedure.  ________________________________  -----------------------------------  Billie Lade, PhD, MD

## 2012-02-05 ENCOUNTER — Other Ambulatory Visit: Payer: BC Managed Care – PPO | Admitting: Lab

## 2012-02-05 ENCOUNTER — Ambulatory Visit: Payer: BC Managed Care – PPO | Admitting: Internal Medicine

## 2012-02-10 ENCOUNTER — Ambulatory Visit
Admission: RE | Admit: 2012-02-10 | Discharge: 2012-02-10 | Disposition: A | Discharge status: Home / Self Care | Payer: Medicaid Other | Source: Ambulatory Visit | Attending: Radiation Oncology | Admitting: Radiation Oncology

## 2012-02-10 ENCOUNTER — Ambulatory Visit (HOSPITAL_BASED_OUTPATIENT_CLINIC_OR_DEPARTMENT_OTHER): Payer: Medicaid Other

## 2012-02-10 ENCOUNTER — Other Ambulatory Visit (HOSPITAL_BASED_OUTPATIENT_CLINIC_OR_DEPARTMENT_OTHER): Payer: Medicaid Other | Admitting: Lab

## 2012-02-10 VITALS — BP 149/97 | HR 80 | Temp 98.8°F | Resp 16

## 2012-02-10 DIAGNOSIS — C3430 Malignant neoplasm of lower lobe, unspecified bronchus or lung: Secondary | ICD-10-CM

## 2012-02-10 DIAGNOSIS — Z5111 Encounter for antineoplastic chemotherapy: Secondary | ICD-10-CM

## 2012-02-10 DIAGNOSIS — C3490 Malignant neoplasm of unspecified part of unspecified bronchus or lung: Secondary | ICD-10-CM

## 2012-02-10 LAB — CBC WITH DIFFERENTIAL/PLATELET
BASO%: 0.3 % (ref 0.0–2.0)
EOS%: 0.5 % (ref 0.0–7.0)
HCT: 41.1 % (ref 38.4–49.9)
LYMPH%: 9.7 % — ABNORMAL LOW (ref 14.0–49.0)
MCH: 32.2 pg (ref 27.2–33.4)
MCHC: 33.8 g/dL (ref 32.0–36.0)
NEUT%: 86.7 % — ABNORMAL HIGH (ref 39.0–75.0)
lymph#: 0.9 10*3/uL (ref 0.9–3.3)

## 2012-02-10 LAB — COMPREHENSIVE METABOLIC PANEL (CC13)
ALT: 11 U/L (ref 0–55)
AST: 17 U/L (ref 5–34)
Alkaline Phosphatase: 139 U/L (ref 40–150)
Chloride: 105 mEq/L (ref 98–107)
Creatinine: 1.1 mg/dL (ref 0.7–1.3)
Total Bilirubin: 0.86 mg/dL (ref 0.20–1.20)

## 2012-02-10 MED ORDER — DEXAMETHASONE SODIUM PHOSPHATE 4 MG/ML IJ SOLN
20.0000 mg | Freq: Once | INTRAMUSCULAR | Status: AC
  Administered 2012-02-10: 20 mg via INTRAVENOUS

## 2012-02-10 MED ORDER — FAMOTIDINE IN NACL 20-0.9 MG/50ML-% IV SOLN
20.0000 mg | Freq: Once | INTRAVENOUS | Status: AC
  Administered 2012-02-10: 20 mg via INTRAVENOUS

## 2012-02-10 MED ORDER — SODIUM CHLORIDE 0.9 % IV SOLN
250.0000 mg | Freq: Once | INTRAVENOUS | Status: AC
  Administered 2012-02-10: 250 mg via INTRAVENOUS
  Filled 2012-02-10: qty 25

## 2012-02-10 MED ORDER — DIPHENHYDRAMINE HCL 50 MG/ML IJ SOLN
50.0000 mg | Freq: Once | INTRAMUSCULAR | Status: AC
  Administered 2012-02-10: 50 mg via INTRAVENOUS

## 2012-02-10 MED ORDER — ONDANSETRON 16 MG/50ML IVPB (CHCC)
16.0000 mg | Freq: Once | INTRAVENOUS | Status: AC
  Administered 2012-02-10: 16 mg via INTRAVENOUS

## 2012-02-10 MED ORDER — PACLITAXEL CHEMO INJECTION 300 MG/50ML
45.0000 mg/m2 | Freq: Once | INTRAVENOUS | Status: AC
  Administered 2012-02-10: 96 mg via INTRAVENOUS
  Filled 2012-02-10: qty 16

## 2012-02-10 MED ORDER — SODIUM CHLORIDE 0.9 % IV SOLN
Freq: Once | INTRAVENOUS | Status: AC
  Administered 2012-02-10: 15:00:00 via INTRAVENOUS

## 2012-02-10 NOTE — Patient Instructions (Addendum)
[facility] Discharge Instructions for Patients Receiving Chemotherapy  Today you received the following chemotherapy agents Taxol/Carboplatin To help prevent nausea and vomiting after your treatment, we encourage you to take your nausea medication as prescribed.  If you develop nausea and vomiting that is not controlled by your nausea medication, call the clinic. If it is after clinic hours your family physician or the after hours number for the clinic or go to the Emergency Department.   BELOW ARE SYMPTOMS THAT SHOULD BE REPORTED IMMEDIATELY:  *FEVER GREATER THAN 100.5 F  *CHILLS WITH OR WITHOUT FEVER  NAUSEA AND VOMITING THAT IS NOT CONTROLLED WITH YOUR NAUSEA MEDICATION  *UNUSUAL SHORTNESS OF BREATH  *UNUSUAL BRUISING OR BLEEDING  TENDERNESS IN MOUTH AND THROAT WITH OR WITHOUT PRESENCE OF ULCERS  *URINARY PROBLEMS  *BOWEL PROBLEMS  UNUSUAL RASH Items with * indicate a potential emergency and should be followed up as soon as possible.  One of the nurses will contact you 24 hours after your treatment. Please let the nurse know about any problems that you may have experienced. Feel free to call the clinic you have any questions or concerns. The clinic phone number is [phone].   I have been informed and understand all the instructions given to me. I know to contact the clinic, my physician, or go to the Emergency Department if any problems should occur. I do not have any questions at this time, but understand that I may call the clinic during office hours   should I have any questions or need assistance in obtaining follow up care.    __________________________________________  _____________  __________ Signature of Patient or Authorized Representative            Date                   Time    __________________________________________ Nurse's Signature    

## 2012-02-11 ENCOUNTER — Ambulatory Visit
Admission: RE | Admit: 2012-02-11 | Discharge: 2012-02-11 | Disposition: A | Discharge status: Home / Self Care | Payer: Medicaid Other | Source: Ambulatory Visit | Attending: Radiation Oncology | Admitting: Radiation Oncology

## 2012-02-11 ENCOUNTER — Other Ambulatory Visit: Payer: Self-pay | Admitting: Internal Medicine

## 2012-02-11 ENCOUNTER — Telehealth: Payer: Self-pay | Admitting: *Deleted

## 2012-02-11 ENCOUNTER — Other Ambulatory Visit: Payer: Self-pay | Admitting: Physician Assistant

## 2012-02-11 VITALS — BP 142/88 | HR 89 | Temp 98.5°F | Wt 199.2 lb

## 2012-02-11 DIAGNOSIS — C3490 Malignant neoplasm of unspecified part of unspecified bronchus or lung: Secondary | ICD-10-CM

## 2012-02-11 NOTE — Telephone Encounter (Signed)
Message left for patient requesting a return call. Awaiting return call from patient

## 2012-02-11 NOTE — Telephone Encounter (Signed)
Message copied by Augusto Garbe on Tue Feb 11, 2012 10:00 AM ------      Message from: Jolaine Artist      Created: Mon Feb 10, 2012  4:30 PM      Regarding: chemo follow-up call       1st time Taxol/Carbo Dr. Arbutus Ped [phone]

## 2012-02-11 NOTE — Progress Notes (Signed)
[facility]    Radiation Oncology [address] [place]     Maryln Gottron, M.D. [place], [place] [address]               Billie Lade, M.D., Ph.D. Phone: [phone]      Molli Hazard A. Kathrynn Running, M.D. Fax: [phone]      Radene Gunning, M.D., Ph.D.         Lurline Hare, M.D.         Grayland Jack, M.D Weekly Treatment Management Note  Name: Roger Mckinney     MRN: [id number]        CSN: [id number] Date: 02/11/2012      DOB: [birth year]  CC: Pcp Not In System         [name]    Status: Outpatient  Diagnosis: The encounter diagnosis was Lung cancer.  Current Dose: 1.8 Gy  Current Fraction: 1  Planned Dose: 63.0 Gy  Narrative: Roger Mckinney was seen today for weekly treatment management. The chart was checked and CBCT  were reviewed. He is tolerating his radiation treatments well thus far. He actually feels quite well after receiving his chemotherapy yesterday. This likely is related to the steroids.   he denies any pain in the chest area or breathing problems or wheezing.  Penicillins-allergy  Current Outpatient Prescriptions  Medication Sig Dispense Refill  . baclofen (LIORESAL) 10 MG tablet       . benzonatate (TESSALON) 200 MG capsule Take 200 mg by mouth 3 (three) times daily as needed. For cough      . dexamethasone (DECADRON) 4 MG tablet       . dextromethorphan-guaiFENesin (MUCINEX DM) 30-600 MG per 12 hr tablet Take 1 tablet by mouth every 12 (twelve) hours.      . fluconazole (DIFLUCAN) 100 MG tablet Take 100 mg by mouth daily.      . folic acid (FOLVITE) 1 MG tablet       . LORazepam (ATIVAN) 0.5 MG tablet Place 0.5 mg under the tongue every 8 (eight) hours. For anxiety/nausea      . Oxycodone HCl 10 MG TABS       . oxyCODONE-acetaminophen (PERCOCET) 10-325 MG per tablet       . prochlorperazine (COMPAZINE) 10 MG tablet Take 10 mg by mouth every 6 (six) hours as needed. For nausea      . promethazine (PHENERGAN) 25 MG suppository Place 25 mg rectally  every 6 (six) hours as needed. For nausea       No current facility-administered medications for this encounter.   Facility-Administered Medications Ordered in Other Encounters  Medication Dose Route Frequency Provider Last Rate Last Dose  . [COMPLETED] 0.9 %  sodium chloride infusion   Intravenous Once Si Gaul, MD      . [COMPLETED] CARBOplatin (PARAPLATIN) 250 mg in sodium chloride 0.9 % 100 mL chemo infusion  250 mg Intravenous Once Si Gaul, MD   250 mg at 02/10/12 1659  . [COMPLETED] dexamethasone (DECADRON) injection 20 mg  20 mg Intravenous Once Si Gaul, MD   20 mg at 02/10/12 1440  . [COMPLETED] diphenhydrAMINE (BENADRYL) injection 50 mg  50 mg Intravenous Once Si Gaul, MD   50 mg at 02/10/12 1440  . [COMPLETED] famotidine (PEPCID) IVPB 20 mg  20 mg Intravenous Once Si Gaul, MD   20 mg at 02/10/12 1500  . [COMPLETED] ondansetron (ZOFRAN) IVPB 16 mg  16 mg Intravenous Once Si Gaul, MD   16 mg  at 02/10/12 1440  . [COMPLETED] PACLitaxel (TAXOL) 96 mg in dextrose 5 % 250 mL chemo infusion (</= 80mg /m2)  45 mg/m2 (Treatment Plan Actual) Intravenous Once Si Gaul, MD   96 mg at 02/10/12 1559   Labs:  Lab Results  Component Value Date   WBC 8.9 02/10/2012   HGB 13.9 02/10/2012   HCT 41.1 02/10/2012   MCV 95.4 02/10/2012   PLT 204 02/10/2012   Lab Results  Component Value Date   CREATININE 1.1 02/10/2012   BUN 6.0* 02/10/2012   NA 140 02/10/2012   K 4.3 02/10/2012   CL 105 02/10/2012   CO2 22 02/10/2012   Lab Results  Component Value Date   ALT 11 02/10/2012   AST 17 02/10/2012   BILITOT 0.86 02/10/2012    Physical Examination:  weight is 199 lb 3.2 oz (90.357 kg). His temperature is 98.5 F (36.9 C). His blood pressure is 142/88 and his pulse is 89. His oxygen saturation is 100%.    Wt Readings from Last 3 Encounters:  02/11/12 199 lb 3.2 oz (90.357 kg)  02/03/12 202 lb 11.2 oz (91.944 kg)  01/28/12 201 lb 3.2 oz (91.264 kg)     The oral cavity is moist without secondary infection. Lungs - Normal respiratory effort, chest expands symmetrically. Lungs are clear to auscultation, no crackles or wheezes.  Heart has regular rhythm and rate  Abdomen is soft and non tender with normal bowel sounds  Assessment:  Patient tolerating treatments well  Plan: Continue treatment per original radiation prescription

## 2012-02-12 ENCOUNTER — Ambulatory Visit
Admission: RE | Admit: 2012-02-12 | Discharge: 2012-02-12 | Disposition: A | Discharge status: Home / Self Care | Payer: Medicaid Other | Source: Ambulatory Visit | Attending: Radiation Oncology | Admitting: Radiation Oncology

## 2012-02-12 ENCOUNTER — Other Ambulatory Visit: Payer: Self-pay | Admitting: Medical Oncology

## 2012-02-12 NOTE — Telephone Encounter (Signed)
Roz notifed to refill rx

## 2012-02-13 ENCOUNTER — Ambulatory Visit
Admission: RE | Admit: 2012-02-13 | Discharge: 2012-02-13 | Disposition: A | Discharge status: Home / Self Care | Payer: Medicaid Other | Source: Ambulatory Visit | Attending: Radiation Oncology | Admitting: Radiation Oncology

## 2012-02-14 ENCOUNTER — Ambulatory Visit
Admission: RE | Admit: 2012-02-14 | Discharge: 2012-02-14 | Disposition: A | Discharge status: Home / Self Care | Payer: Medicaid Other | Source: Ambulatory Visit | Attending: Radiation Oncology | Admitting: Radiation Oncology

## 2012-02-17 ENCOUNTER — Ambulatory Visit (HOSPITAL_BASED_OUTPATIENT_CLINIC_OR_DEPARTMENT_OTHER): Payer: Medicaid Other

## 2012-02-17 ENCOUNTER — Encounter: Payer: Self-pay | Admitting: *Deleted

## 2012-02-17 ENCOUNTER — Telehealth: Payer: Self-pay | Admitting: Internal Medicine

## 2012-02-17 ENCOUNTER — Ambulatory Visit
Admission: RE | Admit: 2012-02-17 | Discharge: 2012-02-17 | Disposition: A | Discharge status: Home / Self Care | Payer: Medicaid Other | Source: Ambulatory Visit | Attending: Radiation Oncology | Admitting: Radiation Oncology

## 2012-02-17 ENCOUNTER — Ambulatory Visit (HOSPITAL_BASED_OUTPATIENT_CLINIC_OR_DEPARTMENT_OTHER): Payer: Medicaid Other | Admitting: Internal Medicine

## 2012-02-17 ENCOUNTER — Other Ambulatory Visit (HOSPITAL_BASED_OUTPATIENT_CLINIC_OR_DEPARTMENT_OTHER): Payer: Medicaid Other | Admitting: Lab

## 2012-02-17 VITALS — BP 132/87 | HR 100 | Temp 97.8°F | Resp 22 | Ht 70.0 in | Wt 200.4 lb

## 2012-02-17 DIAGNOSIS — C3490 Malignant neoplasm of unspecified part of unspecified bronchus or lung: Secondary | ICD-10-CM

## 2012-02-17 DIAGNOSIS — Z5111 Encounter for antineoplastic chemotherapy: Secondary | ICD-10-CM

## 2012-02-17 DIAGNOSIS — C3430 Malignant neoplasm of lower lobe, unspecified bronchus or lung: Secondary | ICD-10-CM

## 2012-02-17 LAB — COMPREHENSIVE METABOLIC PANEL (CC13)
ALT: 16 U/L (ref 0–55)
AST: 16 U/L (ref 5–34)
Albumin: 3.3 g/dL — ABNORMAL LOW (ref 3.5–5.0)
CO2: 22 mEq/L (ref 22–29)
Calcium: 9.3 mg/dL (ref 8.4–10.4)
Chloride: 105 mEq/L (ref 98–107)
Creatinine: 0.9 mg/dL (ref 0.7–1.3)
Potassium: 4.3 mEq/L (ref 3.5–5.1)
Sodium: 137 mEq/L (ref 136–145)
Total Protein: 6.8 g/dL (ref 6.4–8.3)

## 2012-02-17 LAB — CBC WITH DIFFERENTIAL/PLATELET
Eosinophils Absolute: 0.3 10*3/uL (ref 0.0–0.5)
LYMPH%: 18.2 % (ref 14.0–49.0)
MONO#: 0.6 10*3/uL (ref 0.1–0.9)
NEUT#: 5.8 10*3/uL (ref 1.5–6.5)
Platelets: 223 10*3/uL (ref 140–400)
RBC: 4 10*6/uL — ABNORMAL LOW (ref 4.20–5.82)
WBC: 8.3 10*3/uL (ref 4.0–10.3)
lymph#: 1.5 10*3/uL (ref 0.9–3.3)
nRBC: 0 % (ref 0–0)

## 2012-02-17 MED ORDER — HEPARIN SOD (PORK) LOCK FLUSH 100 UNIT/ML IV SOLN
500.0000 [IU] | Freq: Once | INTRAVENOUS | Status: DC | PRN
  Filled 2012-02-17: qty 5

## 2012-02-17 MED ORDER — DIPHENHYDRAMINE HCL 50 MG/ML IJ SOLN
50.0000 mg | Freq: Once | INTRAMUSCULAR | Status: AC
  Administered 2012-02-17: 50 mg via INTRAVENOUS

## 2012-02-17 MED ORDER — FAMOTIDINE IN NACL 20-0.9 MG/50ML-% IV SOLN
20.0000 mg | Freq: Once | INTRAVENOUS | Status: AC
  Administered 2012-02-17: 20 mg via INTRAVENOUS

## 2012-02-17 MED ORDER — FLUCONAZOLE 100 MG PO TABS: 200.0000 mg | ORAL_TABLET | Freq: Every day | ORAL | Status: DC

## 2012-02-17 MED ORDER — DEXAMETHASONE SODIUM PHOSPHATE 4 MG/ML IJ SOLN
20.0000 mg | Freq: Once | INTRAMUSCULAR | Status: AC
  Administered 2012-02-17: 20 mg via INTRAVENOUS

## 2012-02-17 MED ORDER — CARBOPLATIN CHEMO INJECTION 450 MG/45ML
255.2000 mg | Freq: Once | INTRAVENOUS | Status: AC
  Administered 2012-02-17: 260 mg via INTRAVENOUS
  Filled 2012-02-17: qty 26

## 2012-02-17 MED ORDER — SODIUM CHLORIDE 0.9 % IV SOLN
Freq: Once | INTRAVENOUS | Status: AC
  Administered 2012-02-17: 14:00:00 via INTRAVENOUS

## 2012-02-17 MED ORDER — ONDANSETRON 16 MG/50ML IVPB (CHCC)
16.0000 mg | Freq: Once | INTRAVENOUS | Status: AC
  Administered 2012-02-17: 16 mg via INTRAVENOUS

## 2012-02-17 MED ORDER — SODIUM CHLORIDE 0.9 % IJ SOLN
10.0000 mL | INTRAMUSCULAR | Status: DC | PRN
  Filled 2012-02-17: qty 10

## 2012-02-17 MED ORDER — PACLITAXEL CHEMO INJECTION 300 MG/50ML
45.0000 mg/m2 | Freq: Once | INTRAVENOUS | Status: AC
  Administered 2012-02-17: 96 mg via INTRAVENOUS
  Filled 2012-02-17: qty 16

## 2012-02-17 NOTE — Patient Instructions (Addendum)
[facility] Discharge Instructions for Patients Receiving Chemotherapy  Today you received the following chemotherapy agents taxol/carbo  To help prevent nausea and vomiting after your treatment, we encourage you to take your nausea medication  and take it as often as prescribed   If you develop nausea and vomiting that is not controlled by your nausea medication, call the clinic. If it is after clinic hours your family physician or the after hours number for the clinic or go to the Emergency Department.   BELOW ARE SYMPTOMS THAT SHOULD BE REPORTED IMMEDIATELY:  *FEVER GREATER THAN 100.5 F  *CHILLS WITH OR WITHOUT FEVER  NAUSEA AND VOMITING THAT IS NOT CONTROLLED WITH YOUR NAUSEA MEDICATION  *UNUSUAL SHORTNESS OF BREATH  *UNUSUAL BRUISING OR BLEEDING  TENDERNESS IN MOUTH AND THROAT WITH OR WITHOUT PRESENCE OF ULCERS  *URINARY PROBLEMS  *BOWEL PROBLEMS  UNUSUAL RASH Items with * indicate a potential emergency and should be followed up as soon as possible.  One of the nurses will contact you 24 hours after your treatment. Please let the nurse know about any problems that you may have experienced. Feel free to call the clinic you have any questions or concerns. The clinic phone number is [phone].   I have been informed and understand all the instructions given to me. I know to contact the clinic, my physician, or go to the Emergency Department if any problems should occur. I do not have any questions at this time, but understand that I may call the clinic during office hours   should I have any questions or need assistance in obtaining follow up care.    __________________________________________  _____________  __________ Signature of Patient or Authorized Representative            Date                   Time    __________________________________________ Nurse's Signature    

## 2012-02-17 NOTE — Progress Notes (Signed)
Spoke with pt and wife at [facility] today.  Smoking cessation information given and explained

## 2012-02-17 NOTE — Telephone Encounter (Signed)
gv and printed appt schedule for pt for Dec  °

## 2012-02-17 NOTE — Progress Notes (Signed)
[facility] [phone]   [phone]  OFFICE PROGRESS NOTE   DIAGNOSIS: Stage IIB/IIIa non-small cell lung cancer, adenocarcinoma with a large left perihilar mass and questionable mediastinal lymphadenopathy diagnosed in July of 2013   PRIOR THERAPY: Neoadjuvant chemotherapy with cisplatin 75 mg/M2 and Alimta 500 mg/M2 given every 3 weeks. The patient is status post 3 cycles.   CURRENT THERAPY: Concurrent chemoradiation with weekly carboplatin for AUC of 2 and paclitaxel 45 mg/M2. He is status post 1 cycle.   INTERVAL HISTORY: Roger Mckinney 51 y.o. male returns to the clinic today for followup visit accompanied by his wife. The patient is doing fine except for increased mucus secretion as well as oral thrush and difficulty swallowing. He has used Diflucan for the last 3 days but currently running out of his medication. He is tolerating his concurrent chemoradiation fairly well with no significant adverse effects. He denied having any significant fever or chills, no nausea or vomiting. He denied having any significant chest pain, shortness of breath, cough or hemoptysis. He complains of significant fatigue especially after each session of the radiotherapy.   MEDICAL HISTORY: Past Medical History  Diagnosis Date  . PNA (pneumonia)   . Lung mass   . Mitral valve prolapse   . Asthma     as a child  . GERD (gastroesophageal reflux disease)     hx of   . HPV (human papilloma virus) infection     hx of  . Cancer 2013  . Dental disease 2013  September    lower teeth removed.   bone graft   . History of chemotherapy     carboplatin/taxol    ALLERGIES:  is allergic to penicillins.  MEDICATIONS:  Current Outpatient Prescriptions  Medication Sig Dispense Refill  . baclofen (LIORESAL) 10 MG tablet TAKE 1 OR 2 TABLETS BY MOUTH EVERY 8 HOURS AS NEEDED FOR HICCUPS  60 tablet  2  . benzonatate (TESSALON) 200 MG capsule Take 200 mg by mouth 3 (three) times  daily as needed. For cough      . dexamethasone (DECADRON) 4 MG tablet TAKE 1 TABLET BY MOUTH 2 TIMES A DAY THE DAY BEFORE, THE DAY OF AND THE DAY AFTER CHEMOTHERAPY OR AS DIRECTED  40 tablet  2  . dextromethorphan-guaiFENesin (MUCINEX DM) 30-600 MG per 12 hr tablet Take 1 tablet by mouth every 12 (twelve) hours.      . fluconazole (DIFLUCAN) 100 MG tablet Take 100 mg by mouth daily.      . folic acid (FOLVITE) 1 MG tablet TAKE 1 TABLET BY MOUTH ONCE DAILY  30 tablet  2  . LORazepam (ATIVAN) 0.5 MG tablet TAKE 1 TABLET BY MOUTH OR SUBLINGUALLY EVERY 8 TO 12 HOURS AS NEEDED FOR ANXIETY OR NAUSEA.  40 tablet  2  . Oxycodone HCl 10 MG TABS       . oxyCODONE-acetaminophen (PERCOCET) 10-325 MG per tablet       . prochlorperazine (COMPAZINE) 10 MG tablet Take 10 mg by mouth every 6 (six) hours as needed. For nausea      . promethazine (PHENERGAN) 25 MG suppository Place 25 mg rectally every 6 (six) hours as needed. For nausea        SURGICAL HISTORY:  Past Surgical History  Procedure Date  . Appendectomy   . Tonsillectomy   . Video bronchoscopy with endobronchial ultrasound 01/14/2012    Procedure: VIDEO BRONCHOSCOPY WITH ENDOBRONCHIAL ULTRASOUND;  Surgeon: Gwenith Daily  Tyrone Sage, MD;  Location: [facility] OR;  Service: Thoracic;  Laterality: N/A;  . Mediastinoscopy 01/14/2012    Procedure: MEDIASTINOSCOPY;  Surgeon: Delight Ovens, MD;  Location: [facility] OR;  Service: Thoracic;  Laterality: N/A;    REVIEW OF SYSTEMS:  A comprehensive review of systems was negative except for: Constitutional: positive for anorexia and fatigue   PHYSICAL EXAMINATION: General appearance: alert, cooperative and no distress Head: Normocephalic, without obvious abnormality, atraumatic Neck: no adenopathy Lymph nodes: Cervical, supraclavicular, and axillary nodes normal. Resp: clear to auscultation bilaterally Cardio: regular rate and rhythm, S1, S2 normal, no murmur, click, rub or gallop GI: soft, non-tender; bowel sounds normal;  no masses,  no organomegaly Extremities: extremities normal, atraumatic, no cyanosis or edema Neurologic: Alert and oriented X 3, normal strength and tone. Normal symmetric reflexes. Normal coordination and gait  ECOG PERFORMANCE STATUS: 1 - Symptomatic but completely ambulatory  Blood pressure 132/87, pulse 100, temperature 97.8 F (36.6 C), temperature source Oral, resp. rate 22, height 5\' 10"  (1.778 m), weight 200 lb 6.4 oz (90.901 kg).  LABORATORY DATA: Lab Results  Component Value Date   WBC 8.3 02/17/2012   HGB 12.7* 02/17/2012   HCT 38.3* 02/17/2012   MCV 95.8 02/17/2012   PLT 223 02/17/2012      Chemistry      Component Value Date/Time   NA 140 02/10/2012 1355   NA 138 01/13/2012 0849   K 4.3 02/10/2012 1355   K 4.8 01/13/2012 0849   CL 105 02/10/2012 1355   CL 104 01/13/2012 0849   CO2 22 02/10/2012 1355   CO2 26 01/13/2012 0849   BUN 6.0* 02/10/2012 1355   BUN 5* 01/13/2012 0849   CREATININE 1.1 02/10/2012 1355   CREATININE 0.96 01/13/2012 0849      Component Value Date/Time   CALCIUM 9.5 02/10/2012 1355   CALCIUM 9.8 01/13/2012 0849   ALKPHOS 139 02/10/2012 1355   ALKPHOS 111 01/13/2012 0849   AST 17 02/10/2012 1355   AST 17 01/13/2012 0849   ALT 11 02/10/2012 1355   ALT 9 01/13/2012 0849   BILITOT 0.86 02/10/2012 1355   BILITOT 0.6 01/13/2012 0849       RADIOGRAPHIC STUDIES: No results found.  ASSESSMENT: This is a very pleasant 51 years old white male white male with history of stage IIIa non-small cell lung cancer status post 3 cycles of neoadjuvant chemotherapy with partial response and the patient was not a surgical candidate for resection. He is currently undergoing concurrent chemoradiation with weekly carboplatin and paclitaxel is status post 1 cycle.  PLAN: We will proceed with cycle #2 today as scheduled. For the oral thrush I will call his pharmacy with refill for Diflucan. He would come back for followup visit in 2 weeks for evaluation and management any adverse effect of his  chemotherapy. The patient was encouraged to increase oral intake and to stay active.  All questions were answered. The patient knows to call the clinic with any problems, questions or concerns. We can certainly see the patient much sooner if necessary.  I spent 15 minutes counseling the patient face to face. The total time spent in the appointment was 25 minutes.

## 2012-02-17 NOTE — Patient Instructions (Addendum)
Will continue with concurrent chemoradiation today as scheduled. Followup in 2 weeks.

## 2012-02-18 ENCOUNTER — Ambulatory Visit
Admission: RE | Admit: 2012-02-18 | Discharge: 2012-02-18 | Disposition: A | Discharge status: Home / Self Care | Payer: Medicaid Other | Source: Ambulatory Visit | Attending: Radiation Oncology | Admitting: Radiation Oncology

## 2012-02-19 ENCOUNTER — Ambulatory Visit
Admission: RE | Admit: 2012-02-19 | Discharge: 2012-02-19 | Disposition: A | Discharge status: Home / Self Care | Payer: Medicaid Other | Source: Ambulatory Visit | Attending: Radiation Oncology | Admitting: Radiation Oncology

## 2012-02-19 VITALS — BP 141/86 | HR 79 | Temp 98.2°F | Resp 20

## 2012-02-19 DIAGNOSIS — R590 Localized enlarged lymph nodes: Secondary | ICD-10-CM

## 2012-02-19 DIAGNOSIS — C3490 Malignant neoplasm of unspecified part of unspecified bronchus or lung: Secondary | ICD-10-CM

## 2012-02-19 MED ORDER — BIAFINE EX EMUL
CUTANEOUS | Status: DC | PRN
  Administered 2012-02-19: 10:00:00 via TOPICAL

## 2012-02-19 MED ORDER — SUCRALFATE 1 GM/10ML PO SUSP: 1.0000 g | Freq: Four times a day (QID) | ORAL | Status: DC

## 2012-02-19 NOTE — Progress Notes (Signed)
Patient here rad txs, 7/35 completed left lung Alert,oriented x3, non productive cough, no pain, no nausea, eating well, chemotherapy on Mondays , energy level medium  "symptoms relieving his chest pressure on his esophagus stated, appetite some better, needs nutritionist will try and schedule when he has chemo on [facility]

## 2012-02-19 NOTE — Progress Notes (Signed)
Reiterated post sim teaching, biafine cream given to patient with instructions of use of product apply after rad tx daily, went over again signs/symptoms to report,teach back by patient, already has radiation therapy and book

## 2012-02-19 NOTE — Progress Notes (Signed)
[facility]    Radiation Oncology [address] [place]     Roger Mckinney, M.D. [place], [place] [address]               Billie Lade, M.D., Ph.D. Phone: [phone]      Molli Hazard A. Kathrynn Running, M.D. Fax: [phone]      Radene Gunning, M.D., Ph.D.         Lurline Hare, M.D.         Grayland Jack, M.D Weekly Treatment Management Note  Name: Roger Mckinney     MRN: [id number]        CSN: [id number] Date: 02/19/2012      DOB: 1960-04-10  CC: Pcp Not In System         [name]    Status: Outpatient  Diagnosis: The primary encounter diagnosis was Lung cancer. A diagnosis of Mediastinal lymphadenopathy was also pertinent to this visit.  Current Dose: 12.6 Gy  Current Fraction: 7  Planned Dose: 63.0 Gy  Narrative: Roger Mckinney was seen today for weekly treatment management. The chart was checked and CBCT  were reviewed. He is tolerating his radiation and radiosensitizing chemotherapy well at this time. He denies any swallowing problems.  He is actually noticed less mucus production since starting his therapy.  He denies any hemoptysis.  Penicillins  Current Outpatient Prescriptions  Medication Sig Dispense Refill  . baclofen (LIORESAL) 10 MG tablet TAKE 1 OR 2 TABLETS BY MOUTH EVERY 8 HOURS AS NEEDED FOR HICCUPS  60 tablet  2  . benzonatate (TESSALON) 200 MG capsule Take 200 mg by mouth 3 (three) times daily as needed. For cough      . dextromethorphan-guaiFENesin (MUCINEX DM) 30-600 MG per 12 hr tablet Take 1 tablet by mouth every 12 (twelve) hours.      Marland Kitchen emollient (BIAFINE) cream Apply 1 application topically daily. Apply to affected area on skin after daily rad txs      . LORazepam (ATIVAN) 0.5 MG tablet TAKE 1 TABLET BY MOUTH OR SUBLINGUALLY EVERY 8 TO 12 HOURS AS NEEDED FOR ANXIETY OR NAUSEA.  40 tablet  2  . Oxycodone HCl 10 MG TABS Take 10 mg by mouth as needed. Last taken the 02/17/12 night      . prochlorperazine (COMPAZINE) 10 MG tablet Take 10 mg by mouth  every 6 (six) hours as needed. For nausea      . promethazine (PHENERGAN) 25 MG suppository Place 25 mg rectally every 6 (six) hours as needed. For nausea      . fluconazole (DIFLUCAN) 100 MG tablet Take 2 tablets (200 mg total) by mouth daily.  10 tablet  0  . folic acid (FOLVITE) 1 MG tablet TAKE 1 TABLET BY MOUTH ONCE DAILY  30 tablet  2  . sucralfate (CARAFATE) 1 GM/10ML suspension Take 10 mLs (1 g total) by mouth 4 (four) times daily.  420 mL  0   Current Facility-Administered Medications  Medication Dose Route Frequency Provider Last Rate Last Dose  . topical emolient (BIAFINE) emulsion   Topical PRN Billie Lade, MD       Labs:  Lab Results  Component Value Date   WBC 8.3 02/17/2012   HGB 12.7* 02/17/2012   HCT 38.3* 02/17/2012   MCV 95.8 02/17/2012   PLT 223 02/17/2012   Lab Results  Component Value Date   CREATININE 0.9 02/17/2012   BUN 13.0 02/17/2012   NA 137 02/17/2012   K 4.3 02/17/2012  CL 105 02/17/2012   CO2 22 02/17/2012   Lab Results  Component Value Date   ALT 16 02/17/2012   AST 16 02/17/2012   BILITOT 0.81 02/17/2012    Physical Examination:  oral temperature is 98.2 F (36.8 C). His blood pressure is 141/86 and his pulse is 79. His respiration is 20 and oxygen saturation is 100%.    Wt Readings from Last 3 Encounters:  02/17/12 200 lb 6.4 oz (90.901 kg)  02/11/12 199 lb 3.2 oz (90.357 kg)  02/03/12 202 lb 11.2 oz (91.944 kg)    The oral cavity is free of any secondary infection Lungs - Normal respiratory effort, chest expands symmetrically. Lungs are clear to auscultation, no crackles or wheezes.  Heart has regular rhythm and rate  Abdomen is soft and non tender with normal bowel sounds  Assessment:  Patient tolerating treatments well  Plan: Continue treatment per original radiation prescription.  He was given a prescription for Carafate suspension in light of anticipated esophagitis

## 2012-02-20 ENCOUNTER — Ambulatory Visit: Payer: Medicaid Other

## 2012-02-21 ENCOUNTER — Ambulatory Visit
Admission: RE | Admit: 2012-02-21 | Discharge: 2012-02-21 | Disposition: A | Discharge status: Home / Self Care | Payer: Medicaid Other | Source: Ambulatory Visit | Attending: Radiation Oncology | Admitting: Radiation Oncology

## 2012-02-24 ENCOUNTER — Ambulatory Visit
Admission: RE | Admit: 2012-02-24 | Discharge: 2012-02-24 | Disposition: A | Discharge status: Home / Self Care | Payer: Medicaid Other | Source: Ambulatory Visit | Attending: Radiation Oncology | Admitting: Radiation Oncology

## 2012-02-24 ENCOUNTER — Other Ambulatory Visit (HOSPITAL_BASED_OUTPATIENT_CLINIC_OR_DEPARTMENT_OTHER): Payer: Medicaid Other | Admitting: Lab

## 2012-02-24 ENCOUNTER — Ambulatory Visit (HOSPITAL_BASED_OUTPATIENT_CLINIC_OR_DEPARTMENT_OTHER): Payer: Medicaid Other

## 2012-02-24 VITALS — BP 130/99 | HR 76 | Temp 98.7°F | Resp 20

## 2012-02-24 DIAGNOSIS — C3490 Malignant neoplasm of unspecified part of unspecified bronchus or lung: Secondary | ICD-10-CM

## 2012-02-24 DIAGNOSIS — C3430 Malignant neoplasm of lower lobe, unspecified bronchus or lung: Secondary | ICD-10-CM

## 2012-02-24 DIAGNOSIS — Z5111 Encounter for antineoplastic chemotherapy: Secondary | ICD-10-CM

## 2012-02-24 LAB — CBC WITH DIFFERENTIAL/PLATELET
BASO%: 0.5 % (ref 0.0–2.0)
EOS%: 1.9 % (ref 0.0–7.0)
MCH: 31.7 pg (ref 27.2–33.4)
MCHC: 33.7 g/dL (ref 32.0–36.0)
MONO#: 0.8 10*3/uL (ref 0.1–0.9)
RBC: 4.01 10*6/uL — ABNORMAL LOW (ref 4.20–5.82)
WBC: 5.8 10*3/uL (ref 4.0–10.3)
lymph#: 1.5 10*3/uL (ref 0.9–3.3)
nRBC: 0 % (ref 0–0)

## 2012-02-24 LAB — COMPREHENSIVE METABOLIC PANEL (CC13)
ALT: 10 U/L (ref 0–55)
AST: 14 U/L (ref 5–34)
Albumin: 3.2 g/dL — ABNORMAL LOW (ref 3.5–5.0)
Alkaline Phosphatase: 100 U/L (ref 40–150)
BUN: 13 mg/dL (ref 7.0–26.0)
Calcium: 9.5 mg/dL (ref 8.4–10.4)
Chloride: 102 mEq/L (ref 98–107)
Creatinine: 0.9 mg/dL (ref 0.7–1.3)
Potassium: 4.2 mEq/L (ref 3.5–5.1)

## 2012-02-24 MED ORDER — DEXAMETHASONE SODIUM PHOSPHATE 4 MG/ML IJ SOLN
20.0000 mg | Freq: Once | INTRAMUSCULAR | Status: AC
  Administered 2012-02-24: 20 mg via INTRAVENOUS

## 2012-02-24 MED ORDER — ONDANSETRON 16 MG/50ML IVPB (CHCC)
16.0000 mg | Freq: Once | INTRAVENOUS | Status: AC
  Administered 2012-02-24: 16 mg via INTRAVENOUS

## 2012-02-24 MED ORDER — PACLITAXEL CHEMO INJECTION 300 MG/50ML
45.0000 mg/m2 | Freq: Once | INTRAVENOUS | Status: AC
  Administered 2012-02-24: 96 mg via INTRAVENOUS
  Filled 2012-02-24: qty 16

## 2012-02-24 MED ORDER — FAMOTIDINE IN NACL 20-0.9 MG/50ML-% IV SOLN
20.0000 mg | Freq: Once | INTRAVENOUS | Status: AC
  Administered 2012-02-24: 20 mg via INTRAVENOUS

## 2012-02-24 MED ORDER — SODIUM CHLORIDE 0.9 % IV SOLN
255.2000 mg | Freq: Once | INTRAVENOUS | Status: AC
  Administered 2012-02-24: 260 mg via INTRAVENOUS
  Filled 2012-02-24: qty 26

## 2012-02-24 MED ORDER — SODIUM CHLORIDE 0.9 % IV SOLN
Freq: Once | INTRAVENOUS | Status: AC
  Administered 2012-02-24: 12:00:00 via INTRAVENOUS

## 2012-02-24 MED ORDER — DIPHENHYDRAMINE HCL 50 MG/ML IJ SOLN
50.0000 mg | Freq: Once | INTRAMUSCULAR | Status: AC
  Administered 2012-02-24: 50 mg via INTRAVENOUS

## 2012-02-24 NOTE — Patient Instructions (Addendum)
[facility] Discharge Instructions for Patients Receiving Chemotherapy  Today you received the following chemotherapy agents :  Taxol,  Carboplatin.  To help prevent nausea and vomiting after your treatment, we encourage you to take your nausea medication as instructed by your physician.    If you develop nausea and vomiting that is not controlled by your nausea medication, call the clinic. If it is after clinic hours your family physician or the after hours number for the clinic or go to the Emergency Department.   BELOW ARE SYMPTOMS THAT SHOULD BE REPORTED IMMEDIATELY:  *FEVER GREATER THAN 100.5 F  *CHILLS WITH OR WITHOUT FEVER  NAUSEA AND VOMITING THAT IS NOT CONTROLLED WITH YOUR NAUSEA MEDICATION  *UNUSUAL SHORTNESS OF BREATH  *UNUSUAL BRUISING OR BLEEDING  TENDERNESS IN MOUTH AND THROAT WITH OR WITHOUT PRESENCE OF ULCERS  *URINARY PROBLEMS  *BOWEL PROBLEMS  UNUSUAL RASH Items with * indicate a potential emergency and should be followed up as soon as possible.  One of the nurses will contact you 24 hours after your treatment. Please let the nurse know about any problems that you may have experienced. Feel free to call the clinic you have any questions or concerns. The clinic phone number is [phone].   I have been informed and understand all the instructions given to me. I know to contact the clinic, my physician, or go to the Emergency Department if any problems should occur. I do not have any questions at this time, but understand that I may call the clinic during office hours   should I have any questions or need assistance in obtaining follow up care.    __________________________________________  _____________  __________ Signature of Patient or Authorized Representative            Date                   Time    __________________________________________ Nurse's Signature    

## 2012-02-25 ENCOUNTER — Ambulatory Visit
Admission: RE | Admit: 2012-02-25 | Discharge: 2012-02-25 | Disposition: A | Discharge status: Home / Self Care | Payer: Medicaid Other | Source: Ambulatory Visit | Attending: Radiation Oncology | Admitting: Radiation Oncology

## 2012-02-26 ENCOUNTER — Ambulatory Visit
Admission: RE | Admit: 2012-02-26 | Discharge: 2012-02-26 | Disposition: A | Discharge status: Home / Self Care | Payer: Medicaid Other | Source: Ambulatory Visit | Attending: Radiation Oncology | Admitting: Radiation Oncology

## 2012-02-27 ENCOUNTER — Ambulatory Visit: Payer: Medicaid Other

## 2012-02-28 ENCOUNTER — Ambulatory Visit
Admission: RE | Admit: 2012-02-28 | Discharge: 2012-02-28 | Disposition: A | Discharge status: Home / Self Care | Payer: Medicaid Other | Source: Ambulatory Visit | Attending: Radiation Oncology | Admitting: Radiation Oncology

## 2012-03-02 ENCOUNTER — Other Ambulatory Visit: Payer: BC Managed Care – PPO | Admitting: Lab

## 2012-03-02 ENCOUNTER — Ambulatory Visit (HOSPITAL_BASED_OUTPATIENT_CLINIC_OR_DEPARTMENT_OTHER): Payer: Medicaid Other | Admitting: Physician Assistant

## 2012-03-02 ENCOUNTER — Ambulatory Visit (HOSPITAL_BASED_OUTPATIENT_CLINIC_OR_DEPARTMENT_OTHER): Payer: Medicaid Other

## 2012-03-02 ENCOUNTER — Encounter: Payer: Self-pay | Admitting: Physician Assistant

## 2012-03-02 ENCOUNTER — Telehealth: Payer: Self-pay | Admitting: Internal Medicine

## 2012-03-02 ENCOUNTER — Ambulatory Visit
Admission: RE | Admit: 2012-03-02 | Discharge: 2012-03-02 | Disposition: A | Discharge status: Home / Self Care | Payer: Medicaid Other | Source: Ambulatory Visit | Attending: Radiation Oncology | Admitting: Radiation Oncology

## 2012-03-02 ENCOUNTER — Other Ambulatory Visit (HOSPITAL_BASED_OUTPATIENT_CLINIC_OR_DEPARTMENT_OTHER): Payer: Medicaid Other | Admitting: Lab

## 2012-03-02 ENCOUNTER — Ambulatory Visit: Payer: Medicaid Other | Admitting: Physician Assistant

## 2012-03-02 VITALS — BP 123/81 | HR 94 | Temp 97.0°F | Resp 18 | Ht 70.0 in | Wt 196.4 lb

## 2012-03-02 DIAGNOSIS — C3490 Malignant neoplasm of unspecified part of unspecified bronchus or lung: Secondary | ICD-10-CM

## 2012-03-02 DIAGNOSIS — C3430 Malignant neoplasm of lower lobe, unspecified bronchus or lung: Secondary | ICD-10-CM

## 2012-03-02 DIAGNOSIS — Z5111 Encounter for antineoplastic chemotherapy: Secondary | ICD-10-CM

## 2012-03-02 LAB — CBC WITH DIFFERENTIAL/PLATELET
BASO%: 0.6 % (ref 0.0–2.0)
Basophils Absolute: 0 10*3/uL (ref 0.0–0.1)
EOS%: 1.1 % (ref 0.0–7.0)
HCT: 38 % — ABNORMAL LOW (ref 38.4–49.9)
HGB: 13.1 g/dL (ref 13.0–17.1)
MCH: 31.8 pg (ref 27.2–33.4)
MCHC: 34.5 g/dL (ref 32.0–36.0)
MCV: 92.2 fL (ref 79.3–98.0)
MONO%: 12 % (ref 0.0–14.0)
NEUT%: 66.8 % (ref 39.0–75.0)
lymph#: 0.9 10*3/uL (ref 0.9–3.3)

## 2012-03-02 MED ORDER — DIPHENHYDRAMINE HCL 50 MG/ML IJ SOLN
50.0000 mg | Freq: Once | INTRAMUSCULAR | Status: AC
  Administered 2012-03-02: 50 mg via INTRAVENOUS

## 2012-03-02 MED ORDER — DEXAMETHASONE SODIUM PHOSPHATE 4 MG/ML IJ SOLN
20.0000 mg | Freq: Once | INTRAMUSCULAR | Status: AC
  Administered 2012-03-02: 20 mg via INTRAVENOUS

## 2012-03-02 MED ORDER — PACLITAXEL CHEMO INJECTION 300 MG/50ML
45.0000 mg/m2 | Freq: Once | INTRAVENOUS | Status: AC
  Administered 2012-03-02: 96 mg via INTRAVENOUS
  Filled 2012-03-02: qty 16

## 2012-03-02 MED ORDER — SODIUM CHLORIDE 0.9 % IV SOLN
255.2000 mg | Freq: Once | INTRAVENOUS | Status: AC
  Administered 2012-03-02: 260 mg via INTRAVENOUS
  Filled 2012-03-02: qty 26

## 2012-03-02 MED ORDER — FAMOTIDINE IN NACL 20-0.9 MG/50ML-% IV SOLN
20.0000 mg | Freq: Once | INTRAVENOUS | Status: AC
Start: 2012-03-02 — End: 2012-03-02
  Administered 2012-03-02: 20 mg via INTRAVENOUS

## 2012-03-02 MED ORDER — SODIUM CHLORIDE 0.9 % IV SOLN
Freq: Once | INTRAVENOUS | Status: AC
  Administered 2012-03-02: 12:00:00 via INTRAVENOUS

## 2012-03-02 MED ORDER — ONDANSETRON 16 MG/50ML IVPB (CHCC)
16.0000 mg | Freq: Once | INTRAVENOUS | Status: AC
  Administered 2012-03-02: 16 mg via INTRAVENOUS

## 2012-03-02 MED ORDER — OXYCODONE HCL 10 MG PO TABS: ORAL_TABLET | ORAL | Status: DC

## 2012-03-02 NOTE — Telephone Encounter (Signed)
gv pt appt schedule for December/January. Pt aware tx for 1/6 has not been added yet and time will change once tx is added. Pt is here daily for xrt and q Monday for chemo. Per pt he will check back w/us re 1/6 appt. lmonvm for michelle re finding chemo on 1/6 closer to xrt appt.

## 2012-03-02 NOTE — Progress Notes (Signed)
[facility] Telephone:(336) [phone]   Fax:(336) [phone]  OFFICE PROGRESS NOTE   DIAGNOSIS: Stage IIB/IIIa non-small cell lung cancer, adenocarcinoma with a large left perihilar mass and questionable mediastinal lymphadenopathy diagnosed in July of 2013   PRIOR THERAPY: Neoadjuvant chemotherapy with cisplatin 75 mg/M2 and Alimta 500 mg/M2 given every 3 weeks. The patient is status post 3 cycles.   CURRENT THERAPY: Concurrent chemoradiation with weekly carboplatin for AUC of 2 and paclitaxel 45 mg/M2.   INTERVAL HISTORY: Roger Mckinney 51 y.o. male returns to the clinic today for followup visit accompanied by his wife. He reports that he is able to move around more. He has stopped the oral steroids. His appetite remains slightly decreased. He reports that his "mind races with chemotherapy" in his chest hurts. Both these issues are helped with his oxycodone. He also reports that he had "an anxiety attack".. He requests a refill for his OxyIR 10 mg tablets. Overall is tolerating his course of concurrent chemoradiation relatively well.  He denied having any significant fever or chills, no nausea or vomiting. He denied having any significant chest pain, shortness of breath, cough or hemoptysis. He complains of significant fatigue especially after each session of the radiotherapy.   MEDICAL HISTORY: Past Medical History  Diagnosis Date  . PNA (pneumonia)   . Lung mass   . Mitral valve prolapse   . Asthma     as a child  . GERD (gastroesophageal reflux disease)     hx of   . HPV (human papilloma virus) infection     hx of  . Cancer 2013  . Dental disease 2013  September    lower teeth removed.   bone graft   . History of chemotherapy     carboplatin/taxol    ALLERGIES:  is allergic to penicillins.  MEDICATIONS:  Current Outpatient Prescriptions  Medication Sig Dispense Refill  . baclofen (LIORESAL) 10 MG tablet TAKE 1 OR 2 TABLETS BY MOUTH EVERY 8 HOURS AS NEEDED FOR  HICCUPS  60 tablet  2  . benzonatate (TESSALON) 200 MG capsule Take 200 mg by mouth 3 (three) times daily as needed. For cough      . dextromethorphan-guaiFENesin (MUCINEX DM) 30-600 MG per 12 hr tablet Take 1 tablet by mouth every 12 (twelve) hours.      Marland Kitchen emollient (BIAFINE) cream Apply 1 application topically daily. Apply to affected area on skin after daily rad txs      . fluconazole (DIFLUCAN) 100 MG tablet Take 2 tablets (200 mg total) by mouth daily.  10 tablet  0  . folic acid (FOLVITE) 1 MG tablet TAKE 1 TABLET BY MOUTH ONCE DAILY  30 tablet  2  . LORazepam (ATIVAN) 0.5 MG tablet TAKE 1 TABLET BY MOUTH OR SUBLINGUALLY EVERY 8 TO 12 HOURS AS NEEDED FOR ANXIETY OR NAUSEA.  40 tablet  2  . Oxycodone HCl 10 MG TABS Take 1 tablet by mouth every 6 hours as needed for pain  60 tablet  0  . prochlorperazine (COMPAZINE) 10 MG tablet Take 10 mg by mouth every 6 (six) hours as needed. For nausea      . promethazine (PHENERGAN) 25 MG suppository Place 25 mg rectally every 6 (six) hours as needed. For nausea      . sucralfate (CARAFATE) 1 GM/10ML suspension Take 10 mLs (1 g total) by mouth 4 (four) times daily.  420 mL  0    SURGICAL HISTORY:  Past  Surgical History  Procedure Date  . Appendectomy   . Tonsillectomy   . Video bronchoscopy with endobronchial ultrasound 01/14/2012    Procedure: VIDEO BRONCHOSCOPY WITH ENDOBRONCHIAL ULTRASOUND;  Surgeon: Delight Ovens, MD;  Location: [facility] OR;  Service: Thoracic;  Laterality: N/A;  . Mediastinoscopy 01/14/2012    Procedure: MEDIASTINOSCOPY;  Surgeon: Delight Ovens, MD;  Location: [facility] OR;  Service: Thoracic;  Laterality: N/A;    REVIEW OF SYSTEMS:  A comprehensive review of systems was negative except for: Constitutional: positive for anorexia and fatigue Cardiovascular: positive for chest pain Behavioral/Psych: positive for anxiety and His mind racing   PHYSICAL EXAMINATION: General appearance: alert, cooperative and no distress Head:  Normocephalic, without obvious abnormality, atraumatic Neck: no adenopathy Lymph nodes: Cervical, supraclavicular, and axillary nodes normal. Resp: clear to auscultation bilaterally Cardio: regular rate and rhythm, S1, S2 normal, no murmur, click, rub or gallop GI: soft, non-tender; bowel sounds normal; no masses,  no organomegaly Extremities: extremities normal, atraumatic, no cyanosis or edema Neurologic: Alert and oriented X 3, normal strength and tone. Normal symmetric reflexes. Normal coordination and gait  ECOG PERFORMANCE STATUS: 1 - Symptomatic but completely ambulatory  Blood pressure 123/81, pulse 94, temperature 97 F (36.1 C), temperature source Oral, resp. rate 18, height 5\' 10"  (1.778 m), weight 196 lb 6.4 oz (89.086 kg).  LABORATORY DATA: Lab Results  Component Value Date   WBC 4.8 03/02/2012   HGB 13.1 03/02/2012   HCT 38.0* 03/02/2012   MCV 92.2 03/02/2012   PLT 161 03/02/2012      Chemistry      Component Value Date/Time   NA 136 02/24/2012 1037   NA 138 01/13/2012 0849   K 4.2 02/24/2012 1037   K 4.8 01/13/2012 0849   CL 102 02/24/2012 1037   CL 104 01/13/2012 0849   CO2 23 02/24/2012 1037   CO2 26 01/13/2012 0849   BUN 13.0 02/24/2012 1037   BUN 5* 01/13/2012 0849   CREATININE 0.9 02/24/2012 1037   CREATININE 0.96 01/13/2012 0849      Component Value Date/Time   CALCIUM 9.5 02/24/2012 1037   CALCIUM 9.8 01/13/2012 0849   ALKPHOS 100 02/24/2012 1037   ALKPHOS 111 01/13/2012 0849   AST 14 02/24/2012 1037   AST 17 01/13/2012 0849   ALT 10 02/24/2012 1037   ALT 9 01/13/2012 0849   BILITOT 0.47 02/24/2012 1037   BILITOT 0.6 01/13/2012 0849       RADIOGRAPHIC STUDIES: No results found.  ASSESSMENT/PLAN: This is a very pleasant 51 years old white male with history of stage IIIa non-small cell lung cancer status post 3 cycles of neoadjuvant chemotherapy with partial response and the patient was not a surgical candidate for resection. He is currently  undergoing concurrent chemoradiation with weekly carboplatin and paclitaxel. The patient was discussed with Dr. Arbutus Ped. He currently has radiation therapy through 04/01/2012. We will need to an additional week of weekly chemotherapy on 03/23/2012. The patient will return in 3 weeks with repeat CBC differential and C. met for another symptom management visit.  Laural Benes, Carlotta Telfair E, PA-C   All questions were answered. The patient knows to call the clinic with any problems, questions or concerns. We can certainly see the patient much sooner if necessary.  I spent 20 minutes counseling the patient face to face. The total time spent in the appointment was 30 minutes.

## 2012-03-02 NOTE — Patient Instructions (Addendum)
[facility] Discharge Instructions for Patients Receiving Chemotherapy  Today you received the following chemotherapy agents: taxol, carboplatin  To help prevent nausea and vomiting after your treatment, we encourage you to take your nausea medication.  Take it as often as prescribed.     If you develop nausea and vomiting that is not controlled by your nausea medication, call the clinic. If it is after clinic hours your family physician or the after hours number for the clinic or go to the Emergency Department.   BELOW ARE SYMPTOMS THAT SHOULD BE REPORTED IMMEDIATELY:  *FEVER GREATER THAN 100.5 F  *CHILLS WITH OR WITHOUT FEVER  NAUSEA AND VOMITING THAT IS NOT CONTROLLED WITH YOUR NAUSEA MEDICATION  *UNUSUAL SHORTNESS OF BREATH  *UNUSUAL BRUISING OR BLEEDING  TENDERNESS IN MOUTH AND THROAT WITH OR WITHOUT PRESENCE OF ULCERS  *URINARY PROBLEMS  *BOWEL PROBLEMS  UNUSUAL RASH Items with * indicate a potential emergency and should be followed up as soon as possible.  Feel free to call the clinic you have any questions or concerns. The clinic phone number is [phone].   I have been informed and understand all the instructions given to me. I know to contact the clinic, my physician, or go to the Emergency Department if any problems should occur. I do not have any questions at this time, but understand that I may call the clinic during office hours   should I have any questions or need assistance in obtaining follow up care.    __________________________________________  _____________  __________ Signature of Patient or Authorized Representative            Date                   Time    __________________________________________ Nurse's Signature    

## 2012-03-02 NOTE — Patient Instructions (Addendum)
Continue your course of concurrent chemoradiation Follow up in 3 weeks

## 2012-03-03 ENCOUNTER — Encounter: Payer: Self-pay | Admitting: Radiation Oncology

## 2012-03-03 ENCOUNTER — Ambulatory Visit
Admission: RE | Admit: 2012-03-03 | Discharge: 2012-03-03 | Disposition: A | Discharge status: Home / Self Care | Payer: Medicaid Other | Source: Ambulatory Visit | Attending: Radiation Oncology | Admitting: Radiation Oncology

## 2012-03-03 VITALS — BP 128/95 | HR 102 | Temp 98.4°F | Resp 20 | Wt 200.2 lb

## 2012-03-03 DIAGNOSIS — C3490 Malignant neoplasm of unspecified part of unspecified bronchus or lung: Secondary | ICD-10-CM

## 2012-03-03 NOTE — Progress Notes (Signed)
[facility]    Radiation Oncology [address] [place]     Maryln Gottron, M.D. [place], [place] [address]               Billie Lade, M.D., Ph.D. Phone: [phone]      Molli Hazard A. Kathrynn Running, M.D. Fax: [phone]      Radene Gunning, M.D., Ph.D.         Lurline Hare, M.D.         Grayland Jack, M.D Weekly Treatment Management Note  Name: Roger Mckinney     MRN: [id number]        CSN: [id number] Date: 03/03/2012      DOB: April 16, 1960  CC: Pcp Not In System         [name]    Status: Outpatient  Diagnosis: The encounter diagnosis was Lung cancer.  Current Dose: 25.2 Gy  Current Fraction: 14  Planned Dose: 63 Gy  Narrative: Roger Mckinney was seen today for weekly treatment management. The chart was checked and CBCT  were reviewed. He continues to tolerate the treatments well. He denies any significant swallowing problems or pain with swallowing. I did however recommend he start using his Carafate suspension.  He does have some fatigue with his treatments but no breathing problems.  Penicillins  Current Outpatient Prescriptions  Medication Sig Dispense Refill  . baclofen (LIORESAL) 10 MG tablet TAKE 1 OR 2 TABLETS BY MOUTH EVERY 8 HOURS AS NEEDED FOR HICCUPS  60 tablet  2  . benzonatate (TESSALON) 200 MG capsule Take 200 mg by mouth 3 (three) times daily as needed. For cough      . dextromethorphan-guaiFENesin (MUCINEX DM) 30-600 MG per 12 hr tablet Take 1 tablet by mouth every 12 (twelve) hours.      Marland Kitchen emollient (BIAFINE) cream Apply 1 application topically daily. Apply to affected area on skin after daily rad txs      . fluconazole (DIFLUCAN) 100 MG tablet Take 2 tablets (200 mg total) by mouth daily.  10 tablet  0  . folic acid (FOLVITE) 1 MG tablet TAKE 1 TABLET BY MOUTH ONCE DAILY  30 tablet  2  . LORazepam (ATIVAN) 0.5 MG tablet TAKE 1 TABLET BY MOUTH OR SUBLINGUALLY EVERY 8 TO 12 HOURS AS NEEDED FOR ANXIETY OR NAUSEA.  40 tablet  2  . Oxycodone HCl 10 MG  TABS Take 1 tablet by mouth every 6 hours as needed for pain  60 tablet  0  . prochlorperazine (COMPAZINE) 10 MG tablet Take 10 mg by mouth every 6 (six) hours as needed. For nausea      . promethazine (PHENERGAN) 25 MG suppository Place 25 mg rectally every 6 (six) hours as needed. For nausea      . sucralfate (CARAFATE) 1 GM/10ML suspension Take 10 mLs (1 g total) by mouth 4 (four) times daily.  420 mL  0   Labs:  Lab Results  Component Value Date   WBC 4.8 03/02/2012   HGB 13.1 03/02/2012   HCT 38.0* 03/02/2012   MCV 92.2 03/02/2012   PLT 161 03/02/2012   Lab Results  Component Value Date   CREATININE 0.9 02/24/2012   BUN 13.0 02/24/2012   NA 136 02/24/2012   K 4.2 02/24/2012   CL 102 02/24/2012   CO2 23 02/24/2012   Lab Results  Component Value Date   ALT 10 02/24/2012   AST 14 02/24/2012   BILITOT 0.47 02/24/2012    Physical Examination:  weight is 200 lb 3.2 oz (90.81 kg). His oral temperature is 98.4 F (36.9 C). His blood pressure is 128/95 and his pulse is 102. His respiration is 20 and oxygen saturation is 99%.    Wt Readings from Last 3 Encounters:  03/03/12 200 lb 3.2 oz (90.81 kg)  03/02/12 196 lb 6.4 oz (89.086 kg)  02/17/12 200 lb 6.4 oz (90.901 kg)    The oral cavity is moist without secondary infection. Lungs - Normal respiratory effort, chest expands symmetrically. Lungs are clear to auscultation, no crackles or wheezes.  Heart has regular rhythm and rate  Abdomen is soft and non tender with normal bowel sounds  Assessment:  Patient tolerating treatments well  Plan: Continue treatment per original radiation prescription

## 2012-03-03 NOTE — Progress Notes (Signed)
Patient here for weekly rad tx lung, completed 14 so far, no c/o nausea, sore throat, coughs up in am clear sputum, not using carafate as yet, taking pain meds on 6 hour increments stated patient Vitals= 98.4,128/95,102,20, 99% room air  9:36 AM

## 2012-03-05 ENCOUNTER — Ambulatory Visit: Payer: Medicaid Other

## 2012-03-05 ENCOUNTER — Encounter: Payer: Self-pay | Admitting: Medical Oncology

## 2012-03-06 ENCOUNTER — Ambulatory Visit: Admission: RE | Admit: 2012-03-06 | Payer: Medicaid Other | Source: Ambulatory Visit

## 2012-03-09 ENCOUNTER — Other Ambulatory Visit (HOSPITAL_BASED_OUTPATIENT_CLINIC_OR_DEPARTMENT_OTHER): Payer: Medicaid Other | Admitting: Lab

## 2012-03-09 ENCOUNTER — Ambulatory Visit
Admission: RE | Admit: 2012-03-09 | Discharge: 2012-03-09 | Disposition: A | Discharge status: Home / Self Care | Payer: Medicaid Other | Source: Ambulatory Visit | Attending: Radiation Oncology | Admitting: Radiation Oncology

## 2012-03-09 ENCOUNTER — Ambulatory Visit (HOSPITAL_BASED_OUTPATIENT_CLINIC_OR_DEPARTMENT_OTHER): Payer: Medicaid Other

## 2012-03-09 VITALS — BP 121/82 | HR 89 | Temp 97.8°F | Resp 20

## 2012-03-09 DIAGNOSIS — C3490 Malignant neoplasm of unspecified part of unspecified bronchus or lung: Secondary | ICD-10-CM

## 2012-03-09 DIAGNOSIS — Z5111 Encounter for antineoplastic chemotherapy: Secondary | ICD-10-CM

## 2012-03-09 DIAGNOSIS — C3430 Malignant neoplasm of lower lobe, unspecified bronchus or lung: Secondary | ICD-10-CM

## 2012-03-09 LAB — COMPREHENSIVE METABOLIC PANEL (CC13)
AST: 23 U/L (ref 5–34)
Albumin: 3 g/dL — ABNORMAL LOW (ref 3.5–5.0)
Alkaline Phosphatase: 90 U/L (ref 40–150)
Calcium: 9.3 mg/dL (ref 8.4–10.4)
Chloride: 103 mEq/L (ref 98–107)
Glucose: 108 mg/dl — ABNORMAL HIGH (ref 70–99)
Potassium: 4.1 mEq/L (ref 3.5–5.1)
Sodium: 137 mEq/L (ref 136–145)
Total Protein: 6.4 g/dL (ref 6.4–8.3)

## 2012-03-09 LAB — CBC WITH DIFFERENTIAL/PLATELET
Basophils Absolute: 0 10*3/uL (ref 0.0–0.1)
EOS%: 1.6 % (ref 0.0–7.0)
HCT: 35.3 % — ABNORMAL LOW (ref 38.4–49.9)
HGB: 12.2 g/dL — ABNORMAL LOW (ref 13.0–17.1)
MCH: 31.4 pg (ref 27.2–33.4)
MONO#: 0.4 10*3/uL (ref 0.1–0.9)
NEUT#: 2.1 10*3/uL (ref 1.5–6.5)
NEUT%: 57.5 % (ref 39.0–75.0)
RDW: 14.6 % (ref 11.0–14.6)
WBC: 3.7 10*3/uL — ABNORMAL LOW (ref 4.0–10.3)
lymph#: 1 10*3/uL (ref 0.9–3.3)

## 2012-03-09 MED ORDER — SODIUM CHLORIDE 0.9 % IV SOLN
Freq: Once | INTRAVENOUS | Status: AC
  Administered 2012-03-09: 11:00:00 via INTRAVENOUS

## 2012-03-09 MED ORDER — DEXAMETHASONE SODIUM PHOSPHATE 10 MG/ML IJ SOLN
20.0000 mg | Freq: Once | INTRAMUSCULAR | Status: AC
  Administered 2012-03-09: 20 mg via INTRAVENOUS

## 2012-03-09 MED ORDER — DIPHENHYDRAMINE HCL 50 MG/ML IJ SOLN
50.0000 mg | Freq: Once | INTRAMUSCULAR | Status: AC
  Administered 2012-03-09: 50 mg via INTRAVENOUS

## 2012-03-09 MED ORDER — SODIUM CHLORIDE 0.9 % IV SOLN
255.2000 mg | Freq: Once | INTRAVENOUS | Status: AC
  Administered 2012-03-09: 260 mg via INTRAVENOUS
  Filled 2012-03-09: qty 26

## 2012-03-09 MED ORDER — DEXTROSE 5 % IV SOLN
45.0000 mg/m2 | Freq: Once | INTRAVENOUS | Status: AC
  Administered 2012-03-09: 96 mg via INTRAVENOUS
  Filled 2012-03-09: qty 16

## 2012-03-09 MED ORDER — ONDANSETRON 16 MG/50ML IVPB (CHCC)
16.0000 mg | Freq: Once | INTRAVENOUS | Status: AC
  Administered 2012-03-09: 16 mg via INTRAVENOUS

## 2012-03-09 MED ORDER — FAMOTIDINE IN NACL 20-0.9 MG/50ML-% IV SOLN
20.0000 mg | Freq: Once | INTRAVENOUS | Status: AC
  Administered 2012-03-09: 20 mg via INTRAVENOUS

## 2012-03-09 NOTE — Patient Instructions (Signed)
[facility] Discharge Instructions for Patients Receiving Chemotherapy  Today you received the following chemotherapy agents: Taxol and Carboplatin. To help prevent nausea and vomiting after your treatment, we encourage you to take your nausea medication, Compazine. Take it as needed for the next 72 hours.   If you develop nausea and vomiting that is not controlled by your nausea medication, call the clinic. If it is after clinic hours your family physician or the after hours number for the clinic or go to the Emergency Department.   BELOW ARE SYMPTOMS THAT SHOULD BE REPORTED IMMEDIATELY:  *FEVER GREATER THAN 100.5 F  *CHILLS WITH OR WITHOUT FEVER  NAUSEA AND VOMITING THAT IS NOT CONTROLLED WITH YOUR NAUSEA MEDICATION  *UNUSUAL SHORTNESS OF BREATH  *UNUSUAL BRUISING OR BLEEDING  TENDERNESS IN MOUTH AND THROAT WITH OR WITHOUT PRESENCE OF ULCERS  *URINARY PROBLEMS  *BOWEL PROBLEMS  UNUSUAL RASH Items with * indicate a potential emergency and should be followed up as soon as possible. Feel free to call the clinic you have any questions or concerns. The clinic phone number is [phone].   I have been informed and understand all the instructions given to me. I know to contact the clinic, my physician, or go to the Emergency Department if any problems should occur. I do not have any questions at this time, but understand that I may call the clinic during office hours   should I have any questions or need assistance in obtaining follow up care.

## 2012-03-10 ENCOUNTER — Ambulatory Visit
Admission: RE | Admit: 2012-03-10 | Discharge: 2012-03-10 | Disposition: A | Discharge status: Home / Self Care | Payer: Medicaid Other | Source: Ambulatory Visit | Attending: Radiation Oncology | Admitting: Radiation Oncology

## 2012-03-10 VITALS — BP 116/94 | HR 100 | Temp 97.9°F | Wt 198.0 lb

## 2012-03-10 DIAGNOSIS — C3490 Malignant neoplasm of unspecified part of unspecified bronchus or lung: Secondary | ICD-10-CM

## 2012-03-10 NOTE — Progress Notes (Signed)
Weekly Management Note Current Dose:28.8 Gy  Projected Dose:63 Gy   Narrative:  The patient presents for routine under treatment assessment.  CBCT/MVCT images/Port film x-rays were reviewed.  The chart was checked. Weight basically stable. Some esophagitis but minimal. Carafate and oxycodone helping. Using biafene.   Physical Findings: Alert and oriented. Dermatitis anteriorly.  Vitals:  Filed Vitals:   03/10/12 1020  BP: 116/94  Pulse: 100  Temp: 97.9 F (36.6 C)   Weight:  Wt Readings from Last 3 Encounters:  03/10/12 198 lb (89.812 kg)  03/03/12 200 lb 3.2 oz (90.81 kg)  03/02/12 196 lb 6.4 oz (89.086 kg)   Lab Results  Component Value Date   WBC 3.7* 03/09/2012   HGB 12.2* 03/09/2012   HCT 35.3* 03/09/2012   MCV 90.7 03/09/2012   PLT 119* 03/09/2012   Lab Results  Component Value Date   CREATININE 0.9 03/09/2012   BUN 9.0 03/09/2012   NA 137 03/09/2012   K 4.1 03/09/2012   CL 103 03/09/2012   CO2 23 03/09/2012     Impression:  The patient is tolerating radiation.  Plan:  Continue treatment as planned. Discussed eating/drinking/change in foods.

## 2012-03-10 NOTE — Progress Notes (Signed)
Patient here for routine weekly assessment of left lung cancer radiation.Has completed 16 of 35 treatments.Has mild discoloration anteriorly and posteriorly.Continue moisturizer.Mild discomfort and nausea relieved with oxycodone and carafate.Shortness of breath on exeriton.

## 2012-03-12 ENCOUNTER — Ambulatory Visit: Payer: Medicaid Other

## 2012-03-13 ENCOUNTER — Ambulatory Visit: Payer: Medicaid Other

## 2012-03-16 ENCOUNTER — Ambulatory Visit (HOSPITAL_BASED_OUTPATIENT_CLINIC_OR_DEPARTMENT_OTHER): Payer: Medicaid Other

## 2012-03-16 ENCOUNTER — Other Ambulatory Visit: Payer: Self-pay | Admitting: *Deleted

## 2012-03-16 ENCOUNTER — Ambulatory Visit: Payer: Medicaid Other | Admitting: Nutrition

## 2012-03-16 ENCOUNTER — Other Ambulatory Visit (HOSPITAL_BASED_OUTPATIENT_CLINIC_OR_DEPARTMENT_OTHER): Payer: Medicaid Other | Admitting: Lab

## 2012-03-16 ENCOUNTER — Ambulatory Visit
Admission: RE | Admit: 2012-03-16 | Discharge: 2012-03-16 | Disposition: A | Discharge status: Home / Self Care | Payer: Medicaid Other | Source: Ambulatory Visit | Attending: Radiation Oncology | Admitting: Radiation Oncology

## 2012-03-16 DIAGNOSIS — C3430 Malignant neoplasm of lower lobe, unspecified bronchus or lung: Secondary | ICD-10-CM

## 2012-03-16 DIAGNOSIS — Z5111 Encounter for antineoplastic chemotherapy: Secondary | ICD-10-CM

## 2012-03-16 DIAGNOSIS — C3490 Malignant neoplasm of unspecified part of unspecified bronchus or lung: Secondary | ICD-10-CM

## 2012-03-16 LAB — CBC WITH DIFFERENTIAL/PLATELET
Basophils Absolute: 0 10*3/uL (ref 0.0–0.1)
Eosinophils Absolute: 0.1 10*3/uL (ref 0.0–0.5)
HGB: 12.2 g/dL — ABNORMAL LOW (ref 13.0–17.1)
MONO#: 0.4 10*3/uL (ref 0.1–0.9)
NEUT#: 1.3 10*3/uL — ABNORMAL LOW (ref 1.5–6.5)
RBC: 3.86 10*6/uL — ABNORMAL LOW (ref 4.20–5.82)
RDW: 15.3 % — ABNORMAL HIGH (ref 11.0–14.6)
WBC: 2.9 10*3/uL — ABNORMAL LOW (ref 4.0–10.3)
nRBC: 0 % (ref 0–0)

## 2012-03-16 LAB — COMPREHENSIVE METABOLIC PANEL (CC13)
ALT: 13 U/L (ref 0–55)
AST: 16 U/L (ref 5–34)
Albumin: 3.1 g/dL — ABNORMAL LOW (ref 3.5–5.0)
CO2: 24 mEq/L (ref 22–29)
Calcium: 10 mg/dL (ref 8.4–10.4)
Chloride: 101 mEq/L (ref 98–107)
Creatinine: 0.9 mg/dL (ref 0.7–1.3)
Potassium: 3.8 mEq/L (ref 3.5–5.1)

## 2012-03-16 MED ORDER — FAMOTIDINE IN NACL 20-0.9 MG/50ML-% IV SOLN
20.0000 mg | Freq: Once | INTRAVENOUS | Status: AC
  Administered 2012-03-16: 20 mg via INTRAVENOUS

## 2012-03-16 MED ORDER — ONDANSETRON 16 MG/50ML IVPB (CHCC)
16.0000 mg | Freq: Once | INTRAVENOUS | Status: AC
  Administered 2012-03-16: 16 mg via INTRAVENOUS

## 2012-03-16 MED ORDER — SODIUM CHLORIDE 0.9 % IV SOLN
Freq: Once | INTRAVENOUS | Status: AC
  Administered 2012-03-16: 12:00:00 via INTRAVENOUS

## 2012-03-16 MED ORDER — PACLITAXEL CHEMO INJECTION 300 MG/50ML
45.0000 mg/m2 | Freq: Once | INTRAVENOUS | Status: AC
  Administered 2012-03-16: 96 mg via INTRAVENOUS
  Filled 2012-03-16: qty 16

## 2012-03-16 MED ORDER — DEXAMETHASONE SODIUM PHOSPHATE 10 MG/ML IJ SOLN
20.0000 mg | Freq: Once | INTRAMUSCULAR | Status: AC
  Administered 2012-03-16: 20 mg via INTRAVENOUS

## 2012-03-16 MED ORDER — SODIUM CHLORIDE 0.9 % IV SOLN
255.2000 mg | Freq: Once | INTRAVENOUS | Status: AC
  Administered 2012-03-16: 260 mg via INTRAVENOUS
  Filled 2012-03-16: qty 26

## 2012-03-16 MED ORDER — DIPHENHYDRAMINE HCL 50 MG/ML IJ SOLN
50.0000 mg | Freq: Once | INTRAMUSCULAR | Status: AC
  Administered 2012-03-16: 50 mg via INTRAVENOUS

## 2012-03-16 NOTE — Progress Notes (Signed)
Per Dr Donnald Garre, okay to treat with ANC 1.3.  SLJ

## 2012-03-16 NOTE — Progress Notes (Signed)
Patient is a 52 year old male diagnosed with non-small cell lung cancer receiving neoadjuvant chemoradiation therapy. He is a patient of Dr. Shirline Frees.  Past medical history includes pneumonia, mitral valve prolapse, asthma, GERD, and HPV.  Medications include Diflucan, Folvite, Ativan, Compazine, Phenergan, and Carafate.  Labs include glucose 108 and albumin 3.0 on December 30.  Height: 5 feet 10 inches. Weight: 198 pounds December 31. Usual body weight: 248 pounds July 2013. BMI: 28.41.  Patient developing esophagitis secondary to radiation therapy. He also describes delayed nausea and vomiting after chemotherapy. He reports taste alterations as well as thick mucus in the morning when he wakes. Patient reports lactose intolerance. Patient has lost 50 pounds in the past 6 months.  Nutrition diagnosis: Inadequate oral intake related to diagnosis of non-small cell lung cancer and associated treatments as evidenced by a 20% weight loss in 6 months.  Patient meets criteria for severe malnutrition in the context of chronic illness secondary to greater than 10% weight loss in 6 months and less than or equal to 75% of estimated energy requirements for greater than one month.  Intervention: I educated patient on strategies for dealing with side effects from treatment to include radiation esophagitis, nausea and vomiting, and taste alterations. I've given him specific strategies to try. I provided multiple fact sheets for patient to review. I've encouraged patient to continue oral nutrition supplements as tolerated to supplement diet. I provided coupons for patient to take with him today.  Monitoring, evaluation, goals: Patient will tolerate increased oral intake to minimize further weight loss promote maintenance of lean body mass throughout treatment.  Next visit: Monday, January 13, during chemotherapy.

## 2012-03-16 NOTE — Patient Instructions (Signed)
[facility] Discharge Instructions for Patients Receiving Chemotherapy  Today you received the following chemotherapy agents Taxol and Carboplatin  To help prevent nausea and vomiting after your treatment, we encourage you to take your nausea medication as prescribed.   If you develop nausea and vomiting that is not controlled by your nausea medication, call the clinic. If it is after clinic hours your family physician or the after hours number for the clinic or go to the Emergency Department.   BELOW ARE SYMPTOMS THAT SHOULD BE REPORTED IMMEDIATELY:  *FEVER GREATER THAN 100.5 F  *CHILLS WITH OR WITHOUT FEVER  NAUSEA AND VOMITING THAT IS NOT CONTROLLED WITH YOUR NAUSEA MEDICATION  *UNUSUAL SHORTNESS OF BREATH  *UNUSUAL BRUISING OR BLEEDING  TENDERNESS IN MOUTH AND THROAT WITH OR WITHOUT PRESENCE OF ULCERS  *URINARY PROBLEMS  *BOWEL PROBLEMS  UNUSUAL RASH Items with * indicate a potential emergency and should be followed up as soon as possible.  One of the nurses will contact you 24 hours after your treatment. Please let the nurse know about any problems that you may have experienced. Feel free to call the clinic you have any questions or concerns. The clinic phone number is [phone].

## 2012-03-17 ENCOUNTER — Ambulatory Visit: Payer: Medicaid Other

## 2012-03-18 ENCOUNTER — Ambulatory Visit: Payer: Medicaid Other

## 2012-03-19 ENCOUNTER — Ambulatory Visit
Admission: RE | Admit: 2012-03-19 | Discharge: 2012-03-19 | Disposition: A | Discharge status: Home / Self Care | Payer: Medicaid Other | Source: Ambulatory Visit | Attending: Radiation Oncology | Admitting: Radiation Oncology

## 2012-03-20 ENCOUNTER — Other Ambulatory Visit: Payer: Self-pay | Admitting: Medical Oncology

## 2012-03-20 ENCOUNTER — Ambulatory Visit
Admission: RE | Admit: 2012-03-20 | Discharge: 2012-03-20 | Disposition: A | Discharge status: Home / Self Care | Payer: Medicaid Other | Source: Ambulatory Visit | Attending: Radiation Oncology | Admitting: Radiation Oncology

## 2012-03-20 DIAGNOSIS — C3490 Malignant neoplasm of unspecified part of unspecified bronchus or lung: Secondary | ICD-10-CM

## 2012-03-23 ENCOUNTER — Ambulatory Visit (HOSPITAL_BASED_OUTPATIENT_CLINIC_OR_DEPARTMENT_OTHER): Payer: Medicaid Other | Admitting: Physician Assistant

## 2012-03-23 ENCOUNTER — Telehealth: Payer: Self-pay | Admitting: Internal Medicine

## 2012-03-23 ENCOUNTER — Encounter: Payer: Self-pay | Admitting: Physician Assistant

## 2012-03-23 ENCOUNTER — Ambulatory Visit
Admission: RE | Admit: 2012-03-23 | Discharge: 2012-03-23 | Disposition: A | Discharge status: Home / Self Care | Payer: Medicaid Other | Source: Ambulatory Visit | Attending: Radiation Oncology | Admitting: Radiation Oncology

## 2012-03-23 ENCOUNTER — Ambulatory Visit: Payer: Medicaid Other | Admitting: Nutrition

## 2012-03-23 ENCOUNTER — Other Ambulatory Visit (HOSPITAL_BASED_OUTPATIENT_CLINIC_OR_DEPARTMENT_OTHER): Payer: Medicaid Other | Admitting: Lab

## 2012-03-23 ENCOUNTER — Ambulatory Visit (HOSPITAL_BASED_OUTPATIENT_CLINIC_OR_DEPARTMENT_OTHER): Payer: Medicaid Other

## 2012-03-23 VITALS — BP 102/73 | HR 89 | Temp 96.5°F | Resp 20 | Ht 70.0 in | Wt 194.7 lb

## 2012-03-23 DIAGNOSIS — C3430 Malignant neoplasm of lower lobe, unspecified bronchus or lung: Secondary | ICD-10-CM

## 2012-03-23 DIAGNOSIS — C3490 Malignant neoplasm of unspecified part of unspecified bronchus or lung: Secondary | ICD-10-CM

## 2012-03-23 DIAGNOSIS — Z5111 Encounter for antineoplastic chemotherapy: Secondary | ICD-10-CM

## 2012-03-23 LAB — COMPREHENSIVE METABOLIC PANEL (CC13)
ALT: 15 U/L (ref 0–55)
CO2: 22 mEq/L (ref 22–29)
Calcium: 9.7 mg/dL (ref 8.4–10.4)
Chloride: 104 mEq/L (ref 98–107)
Creatinine: 0.9 mg/dL (ref 0.7–1.3)
Glucose: 87 mg/dl (ref 70–99)
Total Bilirubin: 0.66 mg/dL (ref 0.20–1.20)
Total Protein: 7.2 g/dL (ref 6.4–8.3)

## 2012-03-23 LAB — CBC WITH DIFFERENTIAL/PLATELET
Basophils Absolute: 0 10*3/uL (ref 0.0–0.1)
Eosinophils Absolute: 0 10*3/uL (ref 0.0–0.5)
HCT: 33.6 % — ABNORMAL LOW (ref 38.4–49.9)
HGB: 11.5 g/dL — ABNORMAL LOW (ref 13.0–17.1)
LYMPH%: 29.5 % (ref 14.0–49.0)
MCV: 92.1 fL (ref 79.3–98.0)
MONO#: 0.5 10*3/uL (ref 0.1–0.9)
MONO%: 15.6 % — ABNORMAL HIGH (ref 0.0–14.0)
NEUT#: 1.6 10*3/uL (ref 1.5–6.5)
Platelets: 150 10*3/uL (ref 140–400)
WBC: 3 10*3/uL — ABNORMAL LOW (ref 4.0–10.3)

## 2012-03-23 MED ORDER — SODIUM CHLORIDE 0.9 % IV SOLN
255.2000 mg | Freq: Once | INTRAVENOUS | Status: AC
  Administered 2012-03-23: 260 mg via INTRAVENOUS
  Filled 2012-03-23: qty 26

## 2012-03-23 MED ORDER — PACLITAXEL CHEMO INJECTION 300 MG/50ML
45.0000 mg/m2 | Freq: Once | INTRAVENOUS | Status: AC
  Administered 2012-03-23: 96 mg via INTRAVENOUS
  Filled 2012-03-23: qty 16

## 2012-03-23 MED ORDER — ONDANSETRON 16 MG/50ML IVPB (CHCC)
16.0000 mg | Freq: Once | INTRAVENOUS | Status: AC
  Administered 2012-03-23: 16 mg via INTRAVENOUS

## 2012-03-23 MED ORDER — FAMOTIDINE IN NACL 20-0.9 MG/50ML-% IV SOLN
20.0000 mg | Freq: Once | INTRAVENOUS | Status: AC
  Administered 2012-03-23: 20 mg via INTRAVENOUS

## 2012-03-23 MED ORDER — DIPHENHYDRAMINE HCL 50 MG/ML IJ SOLN
50.0000 mg | Freq: Once | INTRAMUSCULAR | Status: AC
  Administered 2012-03-23: 50 mg via INTRAVENOUS

## 2012-03-23 MED ORDER — SODIUM CHLORIDE 0.9 % IV SOLN
Freq: Once | INTRAVENOUS | Status: AC
  Administered 2012-03-23: 13:00:00 via INTRAVENOUS

## 2012-03-23 MED ORDER — DEXAMETHASONE SODIUM PHOSPHATE 10 MG/ML IJ SOLN
20.0000 mg | Freq: Once | INTRAMUSCULAR | Status: AC
  Administered 2012-03-23: 20 mg via INTRAVENOUS

## 2012-03-23 MED ORDER — OXYCODONE HCL 10 MG PO TABS: ORAL_TABLET | ORAL | Status: DC

## 2012-03-23 NOTE — Patient Instructions (Addendum)
Complete your course of chemotherapy/radiation therapy as scheduled Follow up with Dr. Arbutus Ped in approximately 1 month after you complete your course of chemoradiation with a restaging CT scan of your chest to re-evaluate your disease

## 2012-03-23 NOTE — Telephone Encounter (Signed)
gv and printed appt scheduel for pt for Jan and March....emailed michelle to add tx..the patient aware

## 2012-03-23 NOTE — Patient Instructions (Signed)
[facility] Discharge Instructions for Patients Receiving Chemotherapy  Today you received the following chemotherapy agents Taxol and Carboplatin  To help prevent nausea and vomiting after your treatment, we encourage you to take your nausea medication as prescribed.   If you develop nausea and vomiting that is not controlled by your nausea medication, call the clinic. If it is after clinic hours your family physician or the after hours number for the clinic or go to the Emergency Department.   BELOW ARE SYMPTOMS THAT SHOULD BE REPORTED IMMEDIATELY:  *FEVER GREATER THAN 100.5 F  *CHILLS WITH OR WITHOUT FEVER  NAUSEA AND VOMITING THAT IS NOT CONTROLLED WITH YOUR NAUSEA MEDICATION  *UNUSUAL SHORTNESS OF BREATH  *UNUSUAL BRUISING OR BLEEDING  TENDERNESS IN MOUTH AND THROAT WITH OR WITHOUT PRESENCE OF ULCERS  *URINARY PROBLEMS  *BOWEL PROBLEMS  UNUSUAL RASH Items with * indicate a potential emergency and should be followed up as soon as possible.  Feel free to call the clinic you have any questions or concerns. The clinic phone number is [phone].   I have been informed and understand all the instructions given to me. I know to contact the clinic, my physician, or go to the Emergency Department if any problems should occur. I do not have any questions at this time, but understand that I may call the clinic during office hours   should I have any questions or need assistance in obtaining follow up care.    

## 2012-03-23 NOTE — Progress Notes (Signed)
Spoke with patient in the chemotherapy area. His weight continues to decline and was documented as 194.7 pounds January 13 from 198 pounds December 31. Patient continues to experience esophagitis and difficulty swallowing. His taste alterations have improved with strategies the patient has employed since our last visit. Patient reports increased oral intake.  Nutrition diagnosis: Inadequate oral intake has improved.  Intervention: I have educated patient on continued strategies for increasing oral intake. I expanded on strategies for dealing with esophagitis and taste alterations. I've answered his questions.  Monitoring, evaluation, goals: Patient will tolerate oral intake to minimize weight loss throughout treatment.  Next visit: Date, January 20, during chemotherapy.

## 2012-03-24 ENCOUNTER — Ambulatory Visit
Admission: RE | Admit: 2012-03-24 | Discharge: 2012-03-24 | Disposition: A | Discharge status: Home / Self Care | Payer: Medicaid Other | Source: Ambulatory Visit | Attending: Radiation Oncology | Admitting: Radiation Oncology

## 2012-03-24 ENCOUNTER — Encounter: Payer: Self-pay | Admitting: Radiation Oncology

## 2012-03-24 VITALS — BP 119/87 | HR 88 | Resp 16 | Wt 194.6 lb

## 2012-03-24 DIAGNOSIS — C3490 Malignant neoplasm of unspecified part of unspecified bronchus or lung: Secondary | ICD-10-CM

## 2012-03-24 NOTE — Progress Notes (Signed)
[facility]    Radiation Oncology [address] [place]     Roger Mckinney, M.D. [place], [place] [address]               Billie Lade, M.D., Ph.D. Phone: [phone]      Molli Hazard A. Kathrynn Running, M.D. Fax: [phone]      Radene Gunning, M.D., Ph.D.         Lurline Hare, M.D.         Grayland Jack, M.D Weekly Treatment Management Note  Name: Roger Mckinney     MRN: [id number]        CSN: [id number] Date: 03/24/2012      DOB: 1960-08-13  CC: Pcp Not In System         [name]    Status: Outpatient  Diagnosis: The encounter diagnosis was Lung cancer.  Current Dose: 37.8 Gy  Current Fraction: 21  Planned Dose: 63.0 Gy  Narrative: [name] was seen today for weekly treatment management. The chart was checked and CBCT  were reviewed. He is tolerating his treatments reasonably  well at this time. He does have some esophageal symptoms but is able to take in soft foods. He continues to use his Carafate suspension he has minimal fatigue at this time.  He did meet with the nutritionist and was given pointers for weight maintenance.  Penicillins  Current Outpatient Prescriptions  Medication Sig Dispense Refill  . baclofen (LIORESAL) 10 MG tablet TAKE 1 OR 2 TABLETS BY MOUTH EVERY 8 HOURS AS NEEDED FOR HICCUPS  60 tablet  2  . benzonatate (TESSALON) 200 MG capsule Take 200 mg by mouth 3 (three) times daily as needed. For cough      . dextromethorphan-guaiFENesin (MUCINEX DM) 30-600 MG per 12 hr tablet Take 1 tablet by mouth every 12 (twelve) hours.      Marland Kitchen emollient (BIAFINE) cream Apply 1 application topically daily. Apply to affected area on skin after daily rad txs      . fluconazole (DIFLUCAN) 100 MG tablet Take 2 tablets (200 mg total) by mouth daily.  10 tablet  0  . folic acid (FOLVITE) 1 MG tablet TAKE 1 TABLET BY MOUTH ONCE DAILY  30 tablet  2  . HYDROcodone-homatropine (HYCODAN) 5-1.5 MG/5ML syrup Take 5 mLs by mouth every 6 (six) hours as needed.      Marland Kitchen LORazepam  (ATIVAN) 0.5 MG tablet TAKE 1 TABLET BY MOUTH OR SUBLINGUALLY EVERY 8 TO 12 HOURS AS NEEDED FOR ANXIETY OR NAUSEA.  40 tablet  2  . Oxycodone HCl 10 MG TABS Take 1 tablet by mouth every 6 hours as needed for pain  60 tablet  0  . prochlorperazine (COMPAZINE) 10 MG tablet Take 10 mg by mouth every 6 (six) hours as needed. For nausea      . promethazine (PHENERGAN) 25 MG suppository Place 25 mg rectally every 6 (six) hours as needed. For nausea      . sucralfate (CARAFATE) 1 GM/10ML suspension Take 10 mLs (1 g total) by mouth 4 (four) times daily.  420 mL  0   Labs:  Lab Results  Component Value Date   WBC 3.0* 03/23/2012   HGB 11.5* 03/23/2012   HCT 33.6* 03/23/2012   MCV 92.1 03/23/2012   PLT 150 03/23/2012   Lab Results  Component Value Date   CREATININE 0.9 03/23/2012   BUN 11.0 03/23/2012   NA 134* 03/23/2012   K 4.1 03/23/2012   CL  104 03/23/2012   CO2 22 03/23/2012   Lab Results  Component Value Date   ALT 15 03/23/2012   AST 18 03/23/2012   BILITOT 0.66 03/23/2012    Physical Examination:  weight is 194 lb 9.6 oz (88.27 kg). His blood pressure is 119/87 and his pulse is 88. His respiration is 16 and oxygen saturation is 99%.    Wt Readings from Last 3 Encounters:  03/24/12 194 lb 9.6 oz (88.27 kg)  03/23/12 194 lb 11.2 oz (88.315 kg)  03/10/12 198 lb (89.812 kg)    Minimal erythema in the skin of the radiation portals. Lungs - Normal respiratory effort, chest expands symmetrically. Lungs are clear to auscultation, no crackles or wheezes.  Heart has regular rhythm and rate  Abdomen is soft and non tender with normal bowel sounds  Assessment:  Patient tolerating treatments well  Plan: Continue treatment per original radiation prescription

## 2012-03-24 NOTE — Progress Notes (Signed)
Patient presents to the clinic today accompanied by his significant other for a PUT with Dr. Roselind Messier. Patient is alert and oriented to person, place, and time. No distress noted. Steady gait noted. Pleasant affect noted. Patient reports pain this morning in his left upper chest close to his sternum. Patient doesn't place a number on the pain because he insist "its not that bad." patient reports that he takes his oxycodone every six hours and was scheduled to take his next pill at 0900. Patient reports that he forgot his pills at home. Patient reports a decreased appetite. Patient has been able to maintain his weight. Patient reports difficulty sleeping during the night related to pain. Patient reports using biafine bid as directed. Only faint hyperpigmentation noted left upper back. Patient denies cough or SOB. Patient has the hiccups presently. Patient denies painful/difficulty swallowing. Patient reports his appetite is slowly improving. Reported all findings to Dr. Roselind Messier.

## 2012-03-25 ENCOUNTER — Ambulatory Visit
Admission: RE | Admit: 2012-03-25 | Discharge: 2012-03-25 | Disposition: A | Discharge status: Home / Self Care | Payer: Medicaid Other | Source: Ambulatory Visit | Attending: Radiation Oncology | Admitting: Radiation Oncology

## 2012-03-25 ENCOUNTER — Other Ambulatory Visit: Payer: Self-pay | Admitting: *Deleted

## 2012-03-25 MED ORDER — PROCHLORPERAZINE MALEATE 10 MG PO TABS: 10.0000 mg | ORAL_TABLET | Freq: Four times a day (QID) | ORAL | Status: DC | PRN

## 2012-03-25 NOTE — Progress Notes (Signed)
[facility] [phone]   [phone]  OFFICE PROGRESS NOTE   DIAGNOSIS: Stage IIB/IIIa non-small cell lung cancer, adenocarcinoma with a large left perihilar mass and questionable mediastinal lymphadenopathy diagnosed in July of 2013   PRIOR THERAPY: Neoadjuvant chemotherapy with cisplatin 75 mg/M2 and Alimta 500 mg/M2 given every 3 weeks. The patient is status post 3 cycles.   CURRENT THERAPY: Concurrent chemoradiation with weekly carboplatin for AUC of 2 and paclitaxel 45 mg/M2.   INTERVAL HISTORY: Roger Mckinney 52 y.o. male returns to the clinic today for followup visit accompanied by his wife. He reports that he is able to move around more. He does complain of the "ligaments" in his knees "drawing up". Aspercream helps as does walking around. He states that he is taking his nausea medication more frequently at the advice of the dietician. He finds this helpful with his swallowing difficulties. Overall is tolerating his course of concurrent chemoradiation relatively well.  He denied having any significant fever or chills, no nausea or vomiting. He denied having any significant chest pain, shortness of breath, cough or hemoptysis. He continues to complain of significant fatigue especially after each session of the radiotherapy.   MEDICAL HISTORY: Past Medical History  Diagnosis Date  . PNA (pneumonia)   . Lung mass   . Mitral valve prolapse   . Asthma     as a child  . GERD (gastroesophageal reflux disease)     hx of   . HPV (human papilloma virus) infection     hx of  . Cancer 2013  . Dental disease 2013  September    lower teeth removed.   bone graft   . History of chemotherapy     carboplatin/taxol    ALLERGIES:  is allergic to penicillins.  MEDICATIONS:  Current Outpatient Prescriptions  Medication Sig Dispense Refill  . baclofen (LIORESAL) 10 MG tablet TAKE 1 OR 2 TABLETS BY MOUTH EVERY 8 HOURS AS NEEDED FOR HICCUPS  60 tablet  2    . benzonatate (TESSALON) 200 MG capsule Take 200 mg by mouth 3 (three) times daily as needed. For cough      . dextromethorphan-guaiFENesin (MUCINEX DM) 30-600 MG per 12 hr tablet Take 1 tablet by mouth every 12 (twelve) hours.      Marland Kitchen emollient (BIAFINE) cream Apply 1 application topically daily. Apply to affected area on skin after daily rad txs      . fluconazole (DIFLUCAN) 100 MG tablet Take 2 tablets (200 mg total) by mouth daily.  10 tablet  0  . folic acid (FOLVITE) 1 MG tablet TAKE 1 TABLET BY MOUTH ONCE DAILY  30 tablet  2  . HYDROcodone-homatropine (HYCODAN) 5-1.5 MG/5ML syrup Take 5 mLs by mouth every 6 (six) hours as needed.      Marland Kitchen LORazepam (ATIVAN) 0.5 MG tablet TAKE 1 TABLET BY MOUTH OR SUBLINGUALLY EVERY 8 TO 12 HOURS AS NEEDED FOR ANXIETY OR NAUSEA.  40 tablet  2  . Oxycodone HCl 10 MG TABS Take 1 tablet by mouth every 6 hours as needed for pain  60 tablet  0  . prochlorperazine (COMPAZINE) 10 MG tablet Take 1 tablet (10 mg total) by mouth every 6 (six) hours as needed. For nausea  30 tablet  1  . promethazine (PHENERGAN) 25 MG suppository Place 25 mg rectally every 6 (six) hours as needed. For nausea      . sucralfate (CARAFATE) 1 GM/10ML suspension Take 10 mLs (  1 g total) by mouth 4 (four) times daily.  420 mL  0    SURGICAL HISTORY:  Past Surgical History  Procedure Date  . Appendectomy   . Tonsillectomy   . Video bronchoscopy with endobronchial ultrasound 01/14/2012    Procedure: VIDEO BRONCHOSCOPY WITH ENDOBRONCHIAL ULTRASOUND;  Surgeon: Delight Ovens, MD;  Location: [facility] OR;  Service: Thoracic;  Laterality: N/A;  . Mediastinoscopy 01/14/2012    Procedure: MEDIASTINOSCOPY;  Surgeon: Delight Ovens, MD;  Location: [facility] OR;  Service: Thoracic;  Laterality: N/A;    REVIEW OF SYSTEMS:  Pertinent items are noted in HPI.   PHYSICAL EXAMINATION: General appearance: alert, cooperative and no distress Head: Normocephalic, without obvious abnormality, atraumatic Neck: no  adenopathy Lymph nodes: Cervical, supraclavicular, and axillary nodes normal. Resp: clear to auscultation bilaterally Cardio: regular rate and rhythm, S1, S2 normal, no murmur, click, rub or gallop GI: soft, non-tender; bowel sounds normal; no masses,  no organomegaly Extremities: extremities normal, atraumatic, no cyanosis or edema Neurologic: Alert and oriented X 3, normal strength and tone. Normal symmetric reflexes. Normal coordination and gait  ECOG PERFORMANCE STATUS: 1 - Symptomatic but completely ambulatory  Blood pressure 102/73, pulse 89, temperature 96.5 F (35.8 C), temperature source Oral, resp. rate 20, height 5\' 10"  (1.778 m), weight 194 lb 11.2 oz (88.315 kg).  LABORATORY DATA: Lab Results  Component Value Date   WBC 3.0* 03/23/2012   HGB 11.5* 03/23/2012   HCT 33.6* 03/23/2012   MCV 92.1 03/23/2012   PLT 150 03/23/2012      Chemistry      Component Value Date/Time   NA 134* 03/23/2012 1043   NA 138 01/13/2012 0849   K 4.1 03/23/2012 1043   K 4.8 01/13/2012 0849   CL 104 03/23/2012 1043   CL 104 01/13/2012 0849   CO2 22 03/23/2012 1043   CO2 26 01/13/2012 0849   BUN 11.0 03/23/2012 1043   BUN 5* 01/13/2012 0849   CREATININE 0.9 03/23/2012 1043   CREATININE 0.96 01/13/2012 0849      Component Value Date/Time   CALCIUM 9.7 03/23/2012 1043   CALCIUM 9.8 01/13/2012 0849   ALKPHOS 101 03/23/2012 1043   ALKPHOS 111 01/13/2012 0849   AST 18 03/23/2012 1043   AST 17 01/13/2012 0849   ALT 15 03/23/2012 1043   ALT 9 01/13/2012 0849   BILITOT 0.66 03/23/2012 1043   BILITOT 0.6 01/13/2012 0849       RADIOGRAPHIC STUDIES: No results found.  ASSESSMENT/PLAN: This is a very pleasant 51 years old white male with history of stage IIIa non-small cell lung cancer status post 3 cycles of neoadjuvant chemotherapy with partial response and the patient was not a surgical candidate for resection. He is currently undergoing concurrent chemoradiation with weekly carboplatin and paclitaxel. The  patient was discussed with Dr. Arbutus Ped. He will complete his course of concurrent chemoradiation as scheduled, He will follow up with Dr. Arbutus Ped in approximately one month after completing his course of concurrent chemoradiation with a restaging CT scan of the chest to re-evaluate his disease.  Laural Benes, Krithi Bray E, PA-C   All questions were answered. The patient knows to call the clinic with any problems, questions or concerns. We can certainly see the patient much sooner if necessary.  I spent 20 minutes counseling the patient face to face. The total time spent in the appointment was 30 minutes.

## 2012-03-26 ENCOUNTER — Ambulatory Visit: Payer: Medicaid Other

## 2012-03-27 ENCOUNTER — Ambulatory Visit: Payer: Medicaid Other

## 2012-03-27 ENCOUNTER — Ambulatory Visit
Admission: RE | Admit: 2012-03-27 | Discharge: 2012-03-27 | Disposition: A | Discharge status: Home / Self Care | Payer: Medicaid Other | Source: Ambulatory Visit | Attending: Radiation Oncology | Admitting: Radiation Oncology

## 2012-03-30 ENCOUNTER — Ambulatory Visit (HOSPITAL_BASED_OUTPATIENT_CLINIC_OR_DEPARTMENT_OTHER): Payer: Medicaid Other

## 2012-03-30 ENCOUNTER — Ambulatory Visit: Payer: Medicaid Other | Admitting: Nutrition

## 2012-03-30 ENCOUNTER — Ambulatory Visit: Payer: Medicaid Other

## 2012-03-30 ENCOUNTER — Ambulatory Visit
Admission: RE | Admit: 2012-03-30 | Discharge: 2012-03-30 | Disposition: A | Discharge status: Home / Self Care | Payer: Medicaid Other | Source: Ambulatory Visit | Attending: Radiation Oncology | Admitting: Radiation Oncology

## 2012-03-30 ENCOUNTER — Other Ambulatory Visit (HOSPITAL_BASED_OUTPATIENT_CLINIC_OR_DEPARTMENT_OTHER): Payer: Medicaid Other | Admitting: Lab

## 2012-03-30 ENCOUNTER — Other Ambulatory Visit: Payer: Medicaid Other | Admitting: Lab

## 2012-03-30 DIAGNOSIS — C3490 Malignant neoplasm of unspecified part of unspecified bronchus or lung: Secondary | ICD-10-CM

## 2012-03-30 DIAGNOSIS — C3430 Malignant neoplasm of lower lobe, unspecified bronchus or lung: Secondary | ICD-10-CM

## 2012-03-30 DIAGNOSIS — Z5111 Encounter for antineoplastic chemotherapy: Secondary | ICD-10-CM

## 2012-03-30 LAB — CBC WITH DIFFERENTIAL/PLATELET
BASO%: 0.5 % (ref 0.0–2.0)
HCT: 37.2 % — ABNORMAL LOW (ref 38.4–49.9)
LYMPH%: 28 % (ref 14.0–49.0)
MCH: 31.8 pg (ref 27.2–33.4)
MCHC: 34.4 g/dL (ref 32.0–36.0)
MCV: 92.5 fL (ref 79.3–98.0)
MONO%: 14 % (ref 0.0–14.0)
NEUT%: 56.4 % (ref 39.0–75.0)
Platelets: 170 10*3/uL (ref 140–400)
RBC: 4.02 10*6/uL — ABNORMAL LOW (ref 4.20–5.82)
nRBC: 0 % (ref 0–0)

## 2012-03-30 LAB — COMPREHENSIVE METABOLIC PANEL (CC13)
ALT: 16 U/L (ref 0–55)
AST: 16 U/L (ref 5–34)
BUN: 12 mg/dL (ref 7.0–26.0)
Calcium: 9.8 mg/dL (ref 8.4–10.4)
Creatinine: 1 mg/dL (ref 0.7–1.3)
Total Bilirubin: 0.69 mg/dL (ref 0.20–1.20)

## 2012-03-30 MED ORDER — SODIUM CHLORIDE 0.9 % IJ SOLN
10.0000 mL | INTRAMUSCULAR | Status: DC | PRN
  Filled 2012-03-30: qty 10

## 2012-03-30 MED ORDER — ONDANSETRON 16 MG/50ML IVPB (CHCC)
16.0000 mg | Freq: Once | INTRAVENOUS | Status: AC
  Administered 2012-03-30: 16 mg via INTRAVENOUS

## 2012-03-30 MED ORDER — PACLITAXEL CHEMO INJECTION 300 MG/50ML
45.0000 mg/m2 | Freq: Once | INTRAVENOUS | Status: AC
  Administered 2012-03-30: 96 mg via INTRAVENOUS
  Filled 2012-03-30: qty 16

## 2012-03-30 MED ORDER — SODIUM CHLORIDE 0.9 % IV SOLN
255.2000 mg | Freq: Once | INTRAVENOUS | Status: AC
  Administered 2012-03-30: 260 mg via INTRAVENOUS
  Filled 2012-03-30: qty 26

## 2012-03-30 MED ORDER — HEPARIN SOD (PORK) LOCK FLUSH 100 UNIT/ML IV SOLN
500.0000 [IU] | Freq: Once | INTRAVENOUS | Status: DC | PRN
  Filled 2012-03-30: qty 5

## 2012-03-30 MED ORDER — DEXAMETHASONE SODIUM PHOSPHATE 4 MG/ML IJ SOLN
20.0000 mg | Freq: Once | INTRAMUSCULAR | Status: AC
  Administered 2012-03-30: 20 mg via INTRAVENOUS

## 2012-03-30 MED ORDER — SODIUM CHLORIDE 0.9 % IV SOLN
Freq: Once | INTRAVENOUS | Status: AC
  Administered 2012-03-30: 09:00:00 via INTRAVENOUS

## 2012-03-30 MED ORDER — FAMOTIDINE IN NACL 20-0.9 MG/50ML-% IV SOLN
20.0000 mg | Freq: Once | INTRAVENOUS | Status: AC
  Administered 2012-03-30: 20 mg via INTRAVENOUS

## 2012-03-30 MED ORDER — DIPHENHYDRAMINE HCL 50 MG/ML IJ SOLN
50.0000 mg | Freq: Once | INTRAMUSCULAR | Status: AC
  Administered 2012-03-30: 50 mg via INTRAVENOUS

## 2012-03-30 NOTE — Progress Notes (Signed)
I spoke with patient and wife in chemotherapy. Patient reports he is doing well. He has no nutrition concerns at this time. He has been trying strategies I have educated him in the past on and he reports that they have helped him increase his intake. His weight is stable and was documented as 194.6 pounds January 14. Patient consumes [employer] breakfast essentials and is requesting additional coupons.  Nutrition diagnosis: Inadequate oral intake has improved.  Intervention: I have educated patient again on strategies for increased oral intake as well as providing support and encouragement. I have educated him to consume [employer] essentials at least once daily. I provided him with coupons at his request.  Monitoring, evaluation, goals: Patient will continue to tolerate oral intake to minimize weight loss throughout treatment.  Next visit: Patient prefers to contact me with further questions or concerns. There are no more chemotherapy appointments scheduled in computer at this time. I am always available to assist him if needed.

## 2012-03-30 NOTE — Patient Instructions (Addendum)
[facility] Discharge Instructions for Patients Receiving Chemotherapy  Today you received the following chemotherapy agents Taxol/Carboplatin To help prevent nausea and vomiting after your treatment, we encourage you to take your nausea medication as directed.   If you develop nausea and vomiting that is not controlled by your nausea medication, call the clinic. If it is after clinic hours your family physician or the after hours number for the clinic or go to the Emergency Department.   BELOW ARE SYMPTOMS THAT SHOULD BE REPORTED IMMEDIATELY:  *FEVER GREATER THAN 100.5 F  *CHILLS WITH OR WITHOUT FEVER  NAUSEA AND VOMITING THAT IS NOT CONTROLLED WITH YOUR NAUSEA MEDICATION  *UNUSUAL SHORTNESS OF BREATH  *UNUSUAL BRUISING OR BLEEDING  TENDERNESS IN MOUTH AND THROAT WITH OR WITHOUT PRESENCE OF ULCERS  *URINARY PROBLEMS  *BOWEL PROBLEMS  UNUSUAL RASH Items with * indicate a potential emergency and should be followed up as soon as possible.  One of the nurses will contact you 24 hours after your treatment. Please let the nurse know about any problems that you may have experienced. Feel free to call the clinic you have any questions or concerns. The clinic phone number is [phone].   I have been informed and understand all the instructions given to me. I know to contact the clinic, my physician, or go to the Emergency Department if any problems should occur. I do not have any questions at this time, but understand that I may call the clinic during office hours   should I have any questions or need assistance in obtaining follow up care.    __________________________________________  _____________  __________ Signature of Patient or Authorized Representative            Date                   Time    __________________________________________ Nurse's Signature

## 2012-03-31 ENCOUNTER — Ambulatory Visit
Admission: RE | Admit: 2012-03-31 | Discharge: 2012-03-31 | Disposition: A | Discharge status: Home / Self Care | Payer: Medicaid Other | Source: Ambulatory Visit | Attending: Radiation Oncology | Admitting: Radiation Oncology

## 2012-03-31 ENCOUNTER — Encounter: Payer: Self-pay | Admitting: Radiation Oncology

## 2012-03-31 ENCOUNTER — Ambulatory Visit: Payer: Medicaid Other

## 2012-03-31 VITALS — BP 144/92 | HR 99 | Resp 18 | Wt 193.1 lb

## 2012-03-31 DIAGNOSIS — C3490 Malignant neoplasm of unspecified part of unspecified bronchus or lung: Secondary | ICD-10-CM

## 2012-03-31 NOTE — Progress Notes (Signed)
[facility]    Radiation Oncology [address] [place]     Maryln Gottron, M.D. [place], [place] [address]               Billie Lade, M.D., Ph.D. Phone: [phone]      Molli Hazard A. Kathrynn Running, M.D. Fax: [phone]      Radene Gunning, M.D., Ph.D.         Lurline Hare, M.D.         Grayland Jack, M.D Weekly Treatment Management Note  Name: Roger Mckinney     MRN: [id number]        CSN: [id number] Date: 03/31/2012      DOB: 06/02/1960  CC: Pcp Not In System         [name]    Status: Outpatient  Diagnosis: The encounter diagnosis was Lung cancer.  Current Dose: 45 Gy  Current Fraction: 25  Planned Dose: 63 Gy  Narrative: [name] was seen today for weekly treatment management. The chart was checked and CBCT  were reviewed. He continues to tolerate the treatments quite well at this time with minimal fatigue. He feels his breathing has improved during the course of treatment. He denies any swallowing difficulties.  Penicillins Current Outpatient Prescriptions  Medication Sig Dispense Refill  . baclofen (LIORESAL) 10 MG tablet TAKE 1 OR 2 TABLETS BY MOUTH EVERY 8 HOURS AS NEEDED FOR HICCUPS  60 tablet  2  . benzonatate (TESSALON) 200 MG capsule Take 200 mg by mouth 3 (three) times daily as needed. For cough      . dextromethorphan-guaiFENesin (MUCINEX DM) 30-600 MG per 12 hr tablet Take 1 tablet by mouth every 12 (twelve) hours.      Marland Kitchen emollient (BIAFINE) cream Apply 1 application topically daily. Apply to affected area on skin after daily rad txs      . fluconazole (DIFLUCAN) 100 MG tablet Take 2 tablets (200 mg total) by mouth daily.  10 tablet  0  . folic acid (FOLVITE) 1 MG tablet TAKE 1 TABLET BY MOUTH ONCE DAILY  30 tablet  2  . HYDROcodone-homatropine (HYCODAN) 5-1.5 MG/5ML syrup Take 5 mLs by mouth every 6 (six) hours as needed.      Marland Kitchen LORazepam (ATIVAN) 0.5 MG tablet TAKE 1 TABLET BY MOUTH OR SUBLINGUALLY EVERY 8 TO 12 HOURS AS NEEDED FOR ANXIETY OR  NAUSEA.  40 tablet  2  . Oxycodone HCl 10 MG TABS Take 1 tablet by mouth every 6 hours as needed for pain  60 tablet  0  . prochlorperazine (COMPAZINE) 10 MG tablet Take 1 tablet (10 mg total) by mouth every 6 (six) hours as needed. For nausea  30 tablet  1  . promethazine (PHENERGAN) 25 MG suppository Place 25 mg rectally every 6 (six) hours as needed. For nausea      . sucralfate (CARAFATE) 1 GM/10ML suspension Take 10 mLs (1 g total) by mouth 4 (four) times daily.  420 mL  0   Labs:  Lab Results  Component Value Date   WBC 3.7* 03/30/2012   HGB 12.8* 03/30/2012   HCT 37.2* 03/30/2012   MCV 92.5 03/30/2012   PLT 170 03/30/2012   Lab Results  Component Value Date   CREATININE 1.0 03/30/2012   BUN 12.0 03/30/2012   NA 136 03/30/2012   K 3.9 03/30/2012   CL 103 03/30/2012   CO2 23 03/30/2012   Lab Results  Component Value Date   ALT 16 03/30/2012  AST 16 03/30/2012   BILITOT 0.69 03/30/2012    Physical Examination:  weight is 193 lb 1.6 oz (87.59 kg). His blood pressure is 144/92 and his pulse is 99. His respiration is 18 and oxygen saturation is 99%.    Wt Readings from Last 3 Encounters:  03/31/12 193 lb 1.6 oz (87.59 kg)  03/24/12 194 lb 9.6 oz (88.27 kg)  03/23/12 194 lb 11.2 oz (88.315 kg)    The oral cavity is moist without secondary infection. Lungs - Normal respiratory effort, chest expands symmetrically. Lungs are clear to auscultation, no crackles or wheezes.  Heart has regular rhythm and rate  Abdomen is soft and non tender with normal bowel sounds  Assessment:  Patient tolerating treatments well  Plan: Continue treatment per original radiation prescription

## 2012-03-31 NOTE — Progress Notes (Signed)
Patient presents to the clinic today accompanied by his wife for a PUT with Dr. Roselind Messier. Patient alert and oriented to person, place, and time. No distress noted. Steady gait noted. Pleasant affect noted. Patient denies pain at this time. Patient's wife concerned about pain in her husband knees and ankles. She wonders if this pain is associated with in activity. No skin changes noted in treatment field. Wife reports applying biafine to treatment area as directed. Weight stable. Patient weighs the same last week as this week. Patient denies pain or difficulty swallowing. Patient reports his cough is much better. Patient reports his cough is only occasional productive cough. Patient reports using carafate as directed. Reported all findings to Dr. Roselind Messier.

## 2012-04-01 ENCOUNTER — Ambulatory Visit: Payer: Medicaid Other

## 2012-04-01 MED ORDER — BIAFINE EX EMUL
Freq: Every day | CUTANEOUS | Status: AC
  Administered 2012-04-01: 12:00:00 via TOPICAL

## 2012-04-01 NOTE — Addendum Note (Signed)
Encounter addended by: Agnes Lawrence, RN on: 04/01/2012 11:54 AM<BR>     Documentation filed: Inpatient MAR, Orders

## 2012-04-02 ENCOUNTER — Ambulatory Visit
Admission: RE | Admit: 2012-04-02 | Discharge: 2012-04-02 | Disposition: A | Discharge status: Home / Self Care | Payer: Medicaid Other | Source: Ambulatory Visit | Attending: Radiation Oncology | Admitting: Radiation Oncology

## 2012-04-03 ENCOUNTER — Ambulatory Visit: Payer: Medicaid Other

## 2012-04-06 ENCOUNTER — Ambulatory Visit
Admission: RE | Admit: 2012-04-06 | Discharge: 2012-04-06 | Disposition: A | Discharge status: Home / Self Care | Payer: Medicaid Other | Source: Ambulatory Visit | Attending: Radiation Oncology | Admitting: Radiation Oncology

## 2012-04-07 ENCOUNTER — Ambulatory Visit: Payer: Medicaid Other

## 2012-04-08 ENCOUNTER — Ambulatory Visit: Payer: Medicaid Other

## 2012-04-09 ENCOUNTER — Ambulatory Visit: Payer: Medicaid Other

## 2012-04-10 ENCOUNTER — Ambulatory Visit: Payer: Medicaid Other

## 2012-04-10 ENCOUNTER — Ambulatory Visit
Admission: RE | Admit: 2012-04-10 | Discharge: 2012-04-10 | Disposition: A | Discharge status: Home / Self Care | Payer: Medicaid Other | Source: Ambulatory Visit | Attending: Radiation Oncology | Admitting: Radiation Oncology

## 2012-04-10 ENCOUNTER — Other Ambulatory Visit: Payer: Self-pay | Admitting: Medical Oncology

## 2012-04-10 DIAGNOSIS — C3490 Malignant neoplasm of unspecified part of unspecified bronchus or lung: Secondary | ICD-10-CM

## 2012-04-10 MED ORDER — OXYCODONE HCL 10 MG PO TABS: ORAL_TABLET | ORAL | Status: DC

## 2012-04-10 MED ORDER — OXYCODONE HCL 10 MG PO TB12: 10.0000 mg | ORAL_TABLET | Freq: Two times a day (BID) | ORAL | Status: DC

## 2012-04-10 NOTE — Telephone Encounter (Signed)
Prn oxycodone is not controlling his pain . Per adrena rx given oxycontin 10 mg q 12 hrs. And given to pts wife.

## 2012-04-10 NOTE — Telephone Encounter (Signed)
Needs oxycodone refill-given to wife

## 2012-04-13 ENCOUNTER — Encounter: Payer: Self-pay | Admitting: Internal Medicine

## 2012-04-13 ENCOUNTER — Ambulatory Visit
Admission: RE | Admit: 2012-04-13 | Discharge: 2012-04-13 | Disposition: A | Discharge status: Home / Self Care | Payer: Medicaid Other | Source: Ambulatory Visit | Attending: Radiation Oncology | Admitting: Radiation Oncology

## 2012-04-13 ENCOUNTER — Encounter: Payer: Self-pay | Admitting: *Deleted

## 2012-04-13 NOTE — Progress Notes (Signed)
Faxed pa form to [facility] for oxycontin 10mg .

## 2012-04-13 NOTE — Progress Notes (Signed)
RECEIVED A FAX FROM [facility] CONCERNING A PRIOR AUTHORIZATION FOR OXYCONTIN. THIS REQUEST WAS GIVEN TO ELIZABETH SUTTON IN MANAGED CARE.

## 2012-04-14 ENCOUNTER — Ambulatory Visit
Admission: RE | Admit: 2012-04-14 | Discharge: 2012-04-14 | Disposition: A | Discharge status: Home / Self Care | Payer: Medicaid Other | Source: Ambulatory Visit | Attending: Radiation Oncology | Admitting: Radiation Oncology

## 2012-04-14 ENCOUNTER — Encounter: Payer: Self-pay | Admitting: Radiation Oncology

## 2012-04-14 VITALS — BP 104/77 | HR 88 | Temp 97.7°F | Resp 20 | Wt 192.6 lb

## 2012-04-14 DIAGNOSIS — C3490 Malignant neoplasm of unspecified part of unspecified bronchus or lung: Secondary | ICD-10-CM

## 2012-04-14 NOTE — Progress Notes (Signed)
Pt denies pain, SOB, has occasional prod cough w/white sputum. C/o loss of appetite. Pt drinks Ensure, Boost, Carnation instant breakfast. Pt taking Carafate. Applying Biafine lotion to tx area and back for dry desquamation.

## 2012-04-14 NOTE — Progress Notes (Signed)
[facility]    Radiation Oncology [address] [place]     Roger Mckinney, M.D. [place], [place] [address]               Billie Lade, M.D., Ph.D. Phone: [phone]      Molli Hazard A. Kathrynn Running, M.D. Fax: [phone]      Radene Gunning, M.D., Ph.D.         Lurline Hare, M.D.         Grayland Jack, M.D Weekly Treatment Management Note  Name: Roger Mckinney     MRN: [id number]        CSN: [id number] Date: 04/14/2012      DOB: January 13, 1961  CC: Pcp Not In System         [name]    Status: Outpatient  Diagnosis: The encounter diagnosis was Lung cancer.  Current Dose: 52.2 Gy  Current Fraction: 29  Planned Dose: 63 Gy  Narrative: Roger Mckinney was seen today for weekly treatment management. The chart was checked and CBCT  were reviewed. He is having some fatigue with this treatment. His major problem is  his poor appetite and altered taste.  Penicillins  Current Outpatient Prescriptions  Medication Sig Dispense Refill  . baclofen (LIORESAL) 10 MG tablet TAKE 1 OR 2 TABLETS BY MOUTH EVERY 8 HOURS AS NEEDED FOR HICCUPS  60 tablet  2  . benzonatate (TESSALON) 200 MG capsule Take 200 mg by mouth 3 (three) times daily as needed. For cough      . dextromethorphan-guaiFENesin (MUCINEX DM) 30-600 MG per 12 hr tablet Take 1 tablet by mouth every 12 (twelve) hours.      Marland Kitchen emollient (BIAFINE) cream Apply 1 application topically daily. Apply to affected area on skin after daily rad txs      . fluconazole (DIFLUCAN) 100 MG tablet Take 2 tablets (200 mg total) by mouth daily.  10 tablet  0  . folic acid (FOLVITE) 1 MG tablet TAKE 1 TABLET BY MOUTH ONCE DAILY  30 tablet  2  . HYDROcodone-homatropine (HYCODAN) 5-1.5 MG/5ML syrup Take 5 mLs by mouth every 6 (six) hours as needed.      Marland Kitchen LORazepam (ATIVAN) 0.5 MG tablet TAKE 1 TABLET BY MOUTH OR SUBLINGUALLY EVERY 8 TO 12 HOURS AS NEEDED FOR ANXIETY OR NAUSEA.  40 tablet  2  . oxyCODONE (OXYCONTIN) 10 MG 12 hr tablet Take 1 tablet (10 mg  total) by mouth every 12 (twelve) hours.  60 tablet  0  . Oxycodone HCl 10 MG TABS Take 1 tablet by mouth every 6 hours as needed for pain  60 tablet  0  . prochlorperazine (COMPAZINE) 10 MG tablet Take 1 tablet (10 mg total) by mouth every 6 (six) hours as needed. For nausea  30 tablet  1  . promethazine (PHENERGAN) 25 MG suppository Place 25 mg rectally every 6 (six) hours as needed. For nausea      . sucralfate (CARAFATE) 1 GM/10ML suspension Take 10 mLs (1 g total) by mouth 4 (four) times daily.  420 mL  0   No current facility-administered medications for this encounter.   Facility-Administered Medications Ordered in Other Encounters  Medication Dose Route Frequency Provider Last Rate Last Dose  . topical emolient (BIAFINE) emulsion   Topical Daily Billie Lade, MD       Labs:  Lab Results  Component Value Date   WBC 3.7* 03/30/2012   HGB 12.8* 03/30/2012   HCT 37.2* 03/30/2012  MCV 92.5 03/30/2012   PLT 170 03/30/2012   Lab Results  Component Value Date   CREATININE 1.0 03/30/2012   BUN 12.0 03/30/2012   NA 136 03/30/2012   K 3.9 03/30/2012   CL 103 03/30/2012   CO2 23 03/30/2012   Lab Results  Component Value Date   ALT 16 03/30/2012   AST 16 03/30/2012   BILITOT 0.69 03/30/2012    Physical Examination:  weight is 192 lb 9.6 oz (87.363 kg). His oral temperature is 97.7 F (36.5 C). His blood pressure is 104/77 and his pulse is 88. His respiration is 20 and oxygen saturation is 98%.    Wt Readings from Last 3 Encounters:  04/14/12 192 lb 9.6 oz (87.363 kg)  03/31/12 193 lb 1.6 oz (87.59 kg)  03/24/12 194 lb 9.6 oz (88.27 kg)    The oral cavity is free of any secondary infection. Lungs - Normal respiratory effort, chest expands symmetrically. Lungs are clear to auscultation, no crackles or wheezes.  Heart has regular rhythm and rate  Abdomen is soft and non tender with normal bowel sounds  Assessment:  Patient tolerating treatments well  Plan: Continue treatment per  original radiation prescription

## 2012-04-15 ENCOUNTER — Ambulatory Visit
Admission: RE | Admit: 2012-04-15 | Discharge: 2012-04-15 | Disposition: A | Discharge status: Home / Self Care | Payer: Medicaid Other | Source: Ambulatory Visit | Attending: Radiation Oncology | Admitting: Radiation Oncology

## 2012-04-15 ENCOUNTER — Other Ambulatory Visit: Payer: Self-pay | Admitting: *Deleted

## 2012-04-15 NOTE — Telephone Encounter (Signed)
Received prior authorization request for Oxycontin 10 mg from [facility].   Gave request to care management.

## 2012-04-16 ENCOUNTER — Ambulatory Visit: Payer: Medicaid Other

## 2012-04-17 ENCOUNTER — Encounter: Payer: Self-pay | Admitting: Internal Medicine

## 2012-04-17 ENCOUNTER — Ambulatory Visit
Admission: RE | Admit: 2012-04-17 | Discharge: 2012-04-17 | Disposition: A | Discharge status: Home / Self Care | Payer: Medicaid Other | Source: Ambulatory Visit | Attending: Radiation Oncology | Admitting: Radiation Oncology

## 2012-04-17 NOTE — Progress Notes (Signed)
[facility] approved oxycontin 10mg  60 tabs from 04/13/12-04/13/13 auth # [id number].

## 2012-04-20 ENCOUNTER — Ambulatory Visit
Admission: RE | Admit: 2012-04-20 | Discharge: 2012-04-20 | Disposition: A | Discharge status: Home / Self Care | Payer: Medicaid Other | Source: Ambulatory Visit | Attending: Radiation Oncology | Admitting: Radiation Oncology

## 2012-04-21 ENCOUNTER — Ambulatory Visit
Admission: RE | Admit: 2012-04-21 | Discharge: 2012-04-21 | Disposition: A | Discharge status: Home / Self Care | Payer: Medicaid Other | Source: Ambulatory Visit | Attending: Radiation Oncology | Admitting: Radiation Oncology

## 2012-04-21 ENCOUNTER — Ambulatory Visit: Payer: Medicaid Other

## 2012-04-21 ENCOUNTER — Encounter: Payer: Self-pay | Admitting: Radiation Oncology

## 2012-04-21 VITALS — BP 121/57 | HR 85 | Resp 18 | Wt 192.3 lb

## 2012-04-21 DIAGNOSIS — C3490 Malignant neoplasm of unspecified part of unspecified bronchus or lung: Secondary | ICD-10-CM

## 2012-04-21 MED ORDER — SILVER SULFADIAZINE 1 % EX CREA
TOPICAL_CREAM | Freq: Once | CUTANEOUS | Status: AC
  Administered 2012-04-21: 13:00:00 via TOPICAL

## 2012-04-21 MED ORDER — BIAFINE EX EMUL
Freq: Every day | CUTANEOUS | Status: DC
  Administered 2012-04-21: 12:00:00 via TOPICAL

## 2012-04-21 MED ORDER — SILVER SULFADIAZINE 1 % EX CREA: TOPICAL_CREAM | Freq: Two times a day (BID) | CUTANEOUS | Status: DC

## 2012-04-21 NOTE — Progress Notes (Signed)
Patient presents to the clinic today accompanied by his wife for a PUT with Dr. Roselind Messier. Patient completes treatment tomorrow. Patient alert and oriented to person, place, and time. No distress noted. Steady gait noted. Pleasant affect noted. Patient denies pain at this time. Patient denies painful or difficulty swallowing soft foods so long as he uses his carafate. Patient weight exactly the same this week and last week. Patient reports supplementing with ensure 4 cans each day. Patient reports a productive cough with clear sputum. Patient denies hemoptysis. Patient denies shortness of breath. Patient reports that constipation is improving with stool softeners. Patient denies nausea, vomiting, headache, or dizziness. Patient reports that now that he has been switched over to oxycodone extended release he is sleeping. Patient given appointment card for one month follow up. Patient's left upper back with hyperpigmentation and mild dry desquamation. Patient reports using biafine bid as directed. Provided patient additional tube of biafine and encouraged to use bid over the next two weeks. Reported all findings to Dr. Roselind Messier.

## 2012-04-21 NOTE — Progress Notes (Signed)
[facility]    Radiation Oncology [address] [place]     Maryln Gottron, M.D. [place], [place] [address]               Billie Lade, M.D., Ph.D. Phone: [phone]      Molli Hazard A. Kathrynn Running, M.D. Fax: [phone]      Radene Gunning, M.D., Ph.D.         Lurline Hare, M.D.         Grayland Jack, M.D Weekly Treatment Management Note  Name: Roger Mckinney     MRN: [id number]        CSN: [id number] Date: 04/21/2012      DOB: 1960-11-14  CC: Pcp Not In System         [name]    Status: Outpatient  Diagnosis: The encounter diagnosis was Lung cancer.  Current Dose: 61.2 Gy  Current Fraction: 34  Planned Dose: 63 Gy  Narrative: Roger Mckinney was seen today for weekly treatment management. The chart was checked and CBCT  were reviewed. He is having some esophageal symptoms but this is helped with Carafate and OxyContin.  He does have some fatigue. He denies any breathing problems at this time.  Penicillins  Current Outpatient Prescriptions  Medication Sig Dispense Refill  . baclofen (LIORESAL) 10 MG tablet TAKE 1 OR 2 TABLETS BY MOUTH EVERY 8 HOURS AS NEEDED FOR HICCUPS  60 tablet  2  . benzonatate (TESSALON) 200 MG capsule Take 200 mg by mouth 3 (three) times daily as needed. For cough      . dextromethorphan-guaiFENesin (MUCINEX DM) 30-600 MG per 12 hr tablet Take 1 tablet by mouth every 12 (twelve) hours.      Marland Kitchen emollient (BIAFINE) cream Apply 1 application topically daily. Apply to affected area on skin after daily rad txs      . folic acid (FOLVITE) 1 MG tablet TAKE 1 TABLET BY MOUTH ONCE DAILY  30 tablet  2  . LORazepam (ATIVAN) 0.5 MG tablet TAKE 1 TABLET BY MOUTH OR SUBLINGUALLY EVERY 8 TO 12 HOURS AS NEEDED FOR ANXIETY OR NAUSEA.  40 tablet  2  . oxyCODONE (OXYCONTIN) 10 MG 12 hr tablet Take 1 tablet (10 mg total) by mouth every 12 (twelve) hours.  60 tablet  0  . sucralfate (CARAFATE) 1 GM/10ML suspension Take 10 mLs (1 g total) by mouth 4 (four) times daily.   420 mL  0  . fluconazole (DIFLUCAN) 100 MG tablet Take 2 tablets (200 mg total) by mouth daily.  10 tablet  0  . HYDROcodone-homatropine (HYCODAN) 5-1.5 MG/5ML syrup Take 5 mLs by mouth every 6 (six) hours as needed.      . Oxycodone HCl 10 MG TABS Take 1 tablet by mouth every 6 hours as needed for pain  60 tablet  0  . prochlorperazine (COMPAZINE) 10 MG tablet Take 1 tablet (10 mg total) by mouth every 6 (six) hours as needed. For nausea  30 tablet  1  . promethazine (PHENERGAN) 25 MG suppository Place 25 mg rectally every 6 (six) hours as needed. For nausea       Current Facility-Administered Medications  Medication Dose Route Frequency Provider Last Rate Last Dose  . silver sulfADIAZINE (SILVADENE) 1 % cream   Topical BID Billie Lade, MD      . silver sulfADIAZINE (SILVADENE) 1 % cream   Topical Once Billie Lade, MD      . topical emolient (BIAFINE) emulsion  Topical Daily Billie Lade, MD       Facility-Administered Medications Ordered in Other Encounters  Medication Dose Route Frequency Provider Last Rate Last Dose  . topical emolient (BIAFINE) emulsion   Topical Daily Billie Lade, MD       Labs:  Lab Results  Component Value Date   WBC 3.7* 03/30/2012   HGB 12.8* 03/30/2012   HCT 37.2* 03/30/2012   MCV 92.5 03/30/2012   PLT 170 03/30/2012   Lab Results  Component Value Date   CREATININE 1.0 03/30/2012   BUN 12.0 03/30/2012   NA 136 03/30/2012   K 3.9 03/30/2012   CL 103 03/30/2012   CO2 23 03/30/2012   Lab Results  Component Value Date   ALT 16 03/30/2012   AST 16 03/30/2012   BILITOT 0.69 03/30/2012    Physical Examination:  weight is 192 lb 4.8 oz (87.227 kg). His blood pressure is 121/57 and his pulse is 85. His respiration is 18 and oxygen saturation is 98%.    Wt Readings from Last 3 Encounters:  04/21/12 192 lb 4.8 oz (87.227 kg)  04/14/12 192 lb 9.6 oz (87.363 kg)  03/31/12 193 lb 1.6 oz (87.59 kg)    The left back region shows some erythema and dry  desquamation without moist desquamation at this time. Lungs - Normal respiratory effort, chest expands symmetrically. Lungs are clear to auscultation, no crackles or wheezes.  Heart has regular rhythm and rate  Abdomen is soft and non tender with normal bowel sounds  Assessment:  Patient tolerating treatments well  Plan: Continue treatment per original radiation prescription .  Patient has been given Silvadene in the event he develops moist desquamation in the back region.

## 2012-04-22 ENCOUNTER — Ambulatory Visit: Payer: Medicaid Other

## 2012-04-24 ENCOUNTER — Ambulatory Visit: Payer: Medicaid Other

## 2012-04-27 ENCOUNTER — Encounter: Payer: Self-pay | Admitting: Radiation Oncology

## 2012-04-27 ENCOUNTER — Ambulatory Visit
Admission: RE | Admit: 2012-04-27 | Discharge: 2012-04-27 | Disposition: A | Discharge status: Home / Self Care | Payer: Medicaid Other | Source: Ambulatory Visit | Attending: Radiation Oncology | Admitting: Radiation Oncology

## 2012-04-27 NOTE — Progress Notes (Signed)
Patient presents to the clinic today accompanied by his wife for PUT with Dr. Roselind Messier following final treatment. Patient is alert and oriented to person, place, and time. No distress noted. Steady gait noted. Pleasant affect noted. Patient denies pain at this time. Patient reports taking a stool softener once every two day and that he had a form bowel movement this morning. Patient denies nausea and vomiting, Patient has gain 4 ounces since last week. Patient reports drinking 4 cans of ensure per day. Patient reports difficult and painful swallowing continue. Patient reports taking oxycodone extended and immediate release as directed. Patient denies shortness of breath. Patient denies hiccups. Patient reports a productive cough with clear sputum. Patient's follow up appointment with Dr. Roselind Messier is 05/18/12. Reported all findings to Dr. Roselind Messier.

## 2012-04-29 ENCOUNTER — Encounter: Payer: Self-pay | Admitting: Radiation Oncology

## 2012-04-29 NOTE — Progress Notes (Signed)
  Radiation Oncology         [phone] ________________________________  Name: Roger Mckinney MRN: [id number]  Date: 04/29/2012  DOB: 1960-07-16  End of Treatment Note  Diagnosis:   Stage III non-small cell lung cancer area.  the patient had a partial response to neoadjuvant chemotherapy and was not felt to be a good surgical candidate.    Indication for treatment:  Salvage along with radiosens. chemotherapy       Radiation treatment dates:   02/10/12-04/27/12  Site/dose:   Left central chest  63 Gy in 35 Fxs  Beams/energy:   3-D conformal multiple beams  Narrative: The patient tolerated radiation treatment relatively well.   He did have some fatigue as well as mod. Esophageal symptoms  Plan: The patient has completed radiation treatment. The patient will return to radiation oncology clinic for routine followup in one month. I advised them to call or return sooner if they have any questions or concerns related to their recovery or treatment.  -----------------------------------  Billie Lade, PhD, MD

## 2012-05-08 ENCOUNTER — Ambulatory Visit (HOSPITAL_COMMUNITY)
Admission: RE | Admit: 2012-05-08 | Discharge: 2012-05-08 | Disposition: A | Discharge status: Home / Self Care | Payer: Medicaid Other | Source: Ambulatory Visit | Attending: Physician Assistant | Admitting: Physician Assistant

## 2012-05-08 DIAGNOSIS — E279 Disorder of adrenal gland, unspecified: Secondary | ICD-10-CM | POA: Insufficient documentation

## 2012-05-08 DIAGNOSIS — C3490 Malignant neoplasm of unspecified part of unspecified bronchus or lung: Secondary | ICD-10-CM | POA: Insufficient documentation

## 2012-05-08 MED ORDER — IOHEXOL 300 MG/ML  SOLN
100.0000 mL | Freq: Once | INTRAMUSCULAR | Status: AC | PRN
  Administered 2012-05-08: 100 mL via INTRAVENOUS

## 2012-05-11 ENCOUNTER — Other Ambulatory Visit (HOSPITAL_BASED_OUTPATIENT_CLINIC_OR_DEPARTMENT_OTHER): Payer: Medicaid Other | Admitting: Lab

## 2012-05-11 ENCOUNTER — Ambulatory Visit (HOSPITAL_BASED_OUTPATIENT_CLINIC_OR_DEPARTMENT_OTHER): Payer: Medicaid Other | Admitting: Internal Medicine

## 2012-05-11 ENCOUNTER — Encounter: Payer: Self-pay | Admitting: Radiation Oncology

## 2012-05-11 ENCOUNTER — Other Ambulatory Visit: Payer: Self-pay | Admitting: Medical Oncology

## 2012-05-11 ENCOUNTER — Encounter: Payer: Self-pay | Admitting: Internal Medicine

## 2012-05-11 DIAGNOSIS — C3430 Malignant neoplasm of lower lobe, unspecified bronchus or lung: Secondary | ICD-10-CM

## 2012-05-11 DIAGNOSIS — R1310 Dysphagia, unspecified: Secondary | ICD-10-CM

## 2012-05-11 DIAGNOSIS — C3490 Malignant neoplasm of unspecified part of unspecified bronchus or lung: Secondary | ICD-10-CM

## 2012-05-11 LAB — COMPREHENSIVE METABOLIC PANEL (CC13)
ALT: 14 U/L (ref 0–55)
AST: 15 U/L (ref 5–34)
Albumin: 3.3 g/dL — ABNORMAL LOW (ref 3.5–5.0)
BUN: 12.9 mg/dL (ref 7.0–26.0)
Calcium: 10.1 mg/dL (ref 8.4–10.4)
Chloride: 106 mEq/L (ref 98–107)
Potassium: 3.9 mEq/L (ref 3.5–5.1)
Sodium: 139 mEq/L (ref 136–145)
Total Protein: 7.1 g/dL (ref 6.4–8.3)

## 2012-05-11 LAB — CBC WITH DIFFERENTIAL/PLATELET
Basophils Absolute: 0 10*3/uL (ref 0.0–0.1)
EOS%: 4.5 % (ref 0.0–7.0)
HGB: 12.9 g/dL — ABNORMAL LOW (ref 13.0–17.1)
MCH: 34.8 pg — ABNORMAL HIGH (ref 27.2–33.4)
NEUT#: 4.3 10*3/uL (ref 1.5–6.5)
RBC: 3.7 10*6/uL — ABNORMAL LOW (ref 4.20–5.82)
RDW: 19 % — ABNORMAL HIGH (ref 11.0–14.6)
lymph#: 0.8 10*3/uL — ABNORMAL LOW (ref 0.9–3.3)

## 2012-05-11 MED ORDER — OXYCODONE HCL 10 MG PO TABS: ORAL_TABLET | ORAL | Status: DC

## 2012-05-11 NOTE — Telephone Encounter (Signed)
rerquested refill -sent to Dr Arbutus Ped for authorization

## 2012-05-11 NOTE — Progress Notes (Signed)
  Radiation Oncology         [phone] ________________________________  Name: Roger Mckinney MRN: [id number]  Date: 05/11/2012  DOB: 01/15/1961  Simulation Verification Note- performed on 02/10/12  Status: outpatient  NARRATIVE: The patient was brought to the treatment unit and placed in the planned treatment position. The clinical setup was verified. Then port films were obtained and uploaded to the radiation oncology medical record software.  The treatment beams were carefully compared against the planned radiation fields. The position location and shape of the radiation fields was reviewed. They targeted volume of tissue appears to be appropriately covered by the radiation beams. Organs at risk appear to be excluded as planned.  Based on my personal review, I approved the simulation verification. The patient's treatment will proceed as planned.  -----------------------------------  Billie Lade, PhD, MD

## 2012-05-11 NOTE — Progress Notes (Signed)
[facility] [phone]   [phone]  OFFICE PROGRESS NOTE  DIAGNOSIS: Stage IIB/IIIA non-small cell lung cancer, adenocarcinoma with a large left perihilar mass and questionable mediastinal lymphadenopathy diagnosed in July of 2013   PRIOR THERAPY:  1) Neoadjuvant chemotherapy with cisplatin 75 mg/M2 and Alimta 500 mg/M2 given every 3 weeks. The patient is status post 3 cycles.  2) Concurrent chemoradiation with weekly carboplatin for AUC of 2 and paclitaxel 45 mg/M2, last dose was given 03/30/2012 with partial response.   CURRENT THERAPY: Observation.  INTERVAL HISTORY: Roger Mckinney 52 y.o. male returns to the clinic today for followup visit accompanied by his wife. The patient is feeling fine today with no specific complaints except for mild radiation induced esophagitis and dysphagia. He tolerated the previous course of concurrent chemoradiation fairly well. His last radiation was 2 weeks ago. He denied having any significant chest pain, shortness breath, cough or hemoptysis. He denied having any significant weight loss or night sweats. The patient had repeat CT scan of the chest performed recently and he is here today for evaluation and discussion of his scan results.  MEDICAL HISTORY: Past Medical History  Diagnosis Date  . PNA (pneumonia)   . Lung mass   . Mitral valve prolapse   . Asthma     as a child  . GERD (gastroesophageal reflux disease)     hx of   . HPV (human papilloma virus) infection     hx of  . Cancer 2013  . Dental disease 2013  September    lower teeth removed.   bone graft   . History of chemotherapy     carboplatin/taxol    ALLERGIES:  is allergic to penicillins.  MEDICATIONS:  Current Outpatient Prescriptions  Medication Sig Dispense Refill  . baclofen (LIORESAL) 10 MG tablet TAKE 1 OR 2 TABLETS BY MOUTH EVERY 8 HOURS AS NEEDED FOR HICCUPS  60 tablet  2  . benzonatate (TESSALON) 200 MG capsule Take 200 mg by  mouth 3 (three) times daily as needed. For cough      . dextromethorphan-guaiFENesin (MUCINEX DM) 30-600 MG per 12 hr tablet Take 1 tablet by mouth every 12 (twelve) hours.      Marland Kitchen emollient (BIAFINE) cream Apply 1 application topically daily. Apply to affected area on skin after daily rad txs      . fluconazole (DIFLUCAN) 100 MG tablet Take 2 tablets (200 mg total) by mouth daily.  10 tablet  0  . folic acid (FOLVITE) 1 MG tablet TAKE 1 TABLET BY MOUTH ONCE DAILY  30 tablet  2  . LORazepam (ATIVAN) 0.5 MG tablet TAKE 1 TABLET BY MOUTH OR SUBLINGUALLY EVERY 8 TO 12 HOURS AS NEEDED FOR ANXIETY OR NAUSEA.  40 tablet  2  . oxyCODONE (OXYCONTIN) 10 MG 12 hr tablet Take 1 tablet (10 mg total) by mouth every 12 (twelve) hours.  60 tablet  0  . silver sulfADIAZINE (SILVADENE) 1 % cream Apply topically daily.      . sucralfate (CARAFATE) 1 GM/10ML suspension Take 10 mLs (1 g total) by mouth 4 (four) times daily.  420 mL  0  . HYDROcodone-homatropine (HYCODAN) 5-1.5 MG/5ML syrup Take 5 mLs by mouth every 6 (six) hours as needed.      . Oxycodone HCl 10 MG TABS Take 1 tablet by mouth every 6 hours as needed for pain  60 tablet  0  . prochlorperazine (COMPAZINE) 10 MG tablet Take  1 tablet (10 mg total) by mouth every 6 (six) hours as needed. For nausea  30 tablet  1  . promethazine (PHENERGAN) 25 MG suppository Place 25 mg rectally every 6 (six) hours as needed. For nausea       No current facility-administered medications for this visit.   Facility-Administered Medications Ordered in Other Visits  Medication Dose Route Frequency Provider Last Rate Last Dose  . topical emolient (BIAFINE) emulsion   Topical Daily Billie Lade, MD        SURGICAL HISTORY:  Past Surgical History  Procedure Laterality Date  . Appendectomy    . Tonsillectomy    . Video bronchoscopy with endobronchial ultrasound  01/14/2012    Procedure: VIDEO BRONCHOSCOPY WITH ENDOBRONCHIAL ULTRASOUND;  Surgeon: Delight Ovens, MD;   Location: [facility] OR;  Service: Thoracic;  Laterality: N/A;  . Mediastinoscopy  01/14/2012    Procedure: MEDIASTINOSCOPY;  Surgeon: Delight Ovens, MD;  Location: [facility] OR;  Service: Thoracic;  Laterality: N/A;    REVIEW OF SYSTEMS:  A comprehensive review of systems was negative.   PHYSICAL EXAMINATION: General appearance: alert, cooperative and no distress Head: Normocephalic, without obvious abnormality, atraumatic Neck: no adenopathy Lymph nodes: Cervical, supraclavicular, and axillary nodes normal. Resp: clear to auscultation bilaterally Cardio: regular rate and rhythm, S1, S2 normal, no murmur, click, rub or gallop GI: soft, non-tender; bowel sounds normal; no masses,  no organomegaly Extremities: extremities normal, atraumatic, no cyanosis or edema Neurologic: Alert and oriented X 3, normal strength and tone. Normal symmetric reflexes. Normal coordination and gait  ECOG PERFORMANCE STATUS: 1 - Symptomatic but completely ambulatory  Blood pressure 125/85, pulse 102, temperature 97.9 F (36.6 C), temperature source Oral, resp. rate 20, height 5\' 10"  (1.778 m), weight 194 lb 9.6 oz (88.27 kg).  LABORATORY DATA: Lab Results  Component Value Date   WBC 5.9 05/11/2012   HGB 12.9* 05/11/2012   HCT 36.5* 05/11/2012   MCV 98.7* 05/11/2012   PLT 153 05/11/2012      Chemistry      Component Value Date/Time   NA 139 05/11/2012 0854   NA 138 01/13/2012 0849   K 3.9 05/11/2012 0854   K 4.8 01/13/2012 0849   CL 106 05/11/2012 0854   CL 104 01/13/2012 0849   CO2 24 05/11/2012 0854   CO2 26 01/13/2012 0849   BUN 12.9 05/11/2012 0854   BUN 5* 01/13/2012 0849   CREATININE 1.1 05/11/2012 0854   CREATININE 0.96 01/13/2012 0849      Component Value Date/Time   CALCIUM 10.1 05/11/2012 0854   CALCIUM 9.8 01/13/2012 0849   ALKPHOS 92 05/11/2012 0854   ALKPHOS 111 01/13/2012 0849   AST 15 05/11/2012 0854   AST 17 01/13/2012 0849   ALT 14 05/11/2012 0854   ALT 9 01/13/2012 0849   BILITOT 0.51 05/11/2012 0854   BILITOT 0.6  01/13/2012 0849       RADIOGRAPHIC STUDIES: Ct Chest W Contrast  05/08/2012  *RADIOLOGY REPORT*  Clinical Data: Restaging lung cancer  CT CHEST WITH CONTRAST  Technique:  Multidetector CT imaging of the chest was performed following the standard protocol during bolus administration of intravenous contrast.  Contrast: OMNIPAQUE IOHEXOL 300 MG/ML  SOLN  Comparison: CT 12/23/2011.  Findings: No pleural effusion identified.  Perihilar mass within the left lung measures 4.1 x 3.1 cm, image 29/series 5.  This is compared with 4.5 x 4.1 cm previously.  Tiny nodule in the left upper lobe is stable  compared with previous exam measuring 3 mm. Within the right upper lobe there is a tiny nodule which measures 3 mm, image 21.  This is also stable from previous exam.   There is a normal heart size.  There is no pericardial effusion. No mediastinal adenopathy.  Index subcarinal lymph node measures 7 mm, image 27.  Previously 8 mm.  Index AP window lymph node measures 8 mm, image 24.  Previously this measured the same.  Imaging through the upper abdomen demonstrates a small 10 mm nodule in the left adrenal gland, image 63/series 2. This may have been present on the previous exam but is more conspicuous on today's study due to the phase of contrast opacification.  The right adrenal gland appears normal.  There are several right posterior rib fractures which appear chronic.  No aggressive lytic or sclerotic bone lesions identified.  IMPRESSION:  1.  No acute findings. 2.  The left lung perihilar mass is slightly decreased in size from previous exam. 3.  Stable sub centimeter mediastinal lymph nodes. 4.  Nodule within the left adrenal gland is indeterminate. Attention on follow-up imaging advised.   Original Report Authenticated By: Signa Kell, M.D.     ASSESSMENT: This is a very pleasant 52 years old white male with history of stage IIb/IIIa non-small cell lung cancer, adenocarcinoma status post neoadjuvant systemic  chemotherapy with cisplatin and Alimta followed by concurrent chemoradiation with carboplatin and paclitaxel with partial response.  PLAN: I discussed the scan results with the patient and his wife. I recommended for him to continue on observation with repeat CT scan of the chest in 3 months. The patient was advised to call immediately if he has any concerning symptoms in the interval. For pain management he was given a refill of oxycodone to be was an as-needed basis.  All questions were answered. The patient knows to call the clinic with any problems, questions or concerns. We can certainly see the patient much sooner if necessary.  I spent 15 minutes counseling the patient face to face. The total time spent in the appointment was 25 minutes.

## 2012-05-11 NOTE — Telephone Encounter (Signed)
Printed

## 2012-05-11 NOTE — Patient Instructions (Addendum)
Your scan showed partial response in her disease. Followup in 3 months with repeat CT scan of the chest.

## 2012-05-13 ENCOUNTER — Telehealth: Payer: Self-pay | Admitting: Internal Medicine

## 2012-05-13 NOTE — Telephone Encounter (Signed)
S/w the pt's wife and she is aware of the June 2014 appts

## 2012-05-18 ENCOUNTER — Encounter: Payer: Self-pay | Admitting: Radiation Oncology

## 2012-05-18 ENCOUNTER — Other Ambulatory Visit: Payer: Self-pay | Admitting: Medical Oncology

## 2012-05-18 ENCOUNTER — Ambulatory Visit
Admission: RE | Admit: 2012-05-18 | Discharge: 2012-05-18 | Disposition: A | Discharge status: Home / Self Care | Payer: Medicaid Other | Source: Ambulatory Visit | Attending: Radiation Oncology | Admitting: Radiation Oncology

## 2012-05-18 MED ORDER — OXYCODONE HCL 10 MG PO TB12: 10.0000 mg | ORAL_TABLET | Freq: Two times a day (BID) | ORAL | Status: DC

## 2012-05-18 NOTE — Progress Notes (Signed)
Patient presents to the clinic today accompanied by his wife for follow up with Dr. Roselind Messier. Patient alert and oriented to person, place, and time. No distress noted. Steady gait noted. Pleasant affect noted. Patient reports throat and chest pain 2 on a scale of 0-10 despite taking oxycontin and oxycodone. Patient is concern about productive cough worse in the morning. Patient reports that the cough is productive with clear sputum. Patient denies nausea and vomiting. Patient reports that he has been fitted for his new teeth and is excited to get them in a month. Patient reports drinking approximately four ensure per day. Patient is requesting refill on carafate. Patient reports that he continues to have difficulty swallowing. Patient reports problems with constipation. Educated patient reference stool softener and laxative. Patient's treatment skin has returned to it normal color but, remains dry. Patient denies dry mouth or sore/ulcerations of the mouth. Reported all findings to Dr. Roselind Messier.

## 2012-05-18 NOTE — Telephone Encounter (Signed)
Requests refill on oxycontin-sen to [facility] for authorization and given to pt

## 2012-05-18 NOTE — Progress Notes (Signed)
Radiation Oncology         [phone] ________________________________  Name: Roger Mckinney MRN: [id number]  Date: 05/18/2012  DOB: [birth year]  Follow-Up Visit Note  CC: Pcp Not In System  Si Gaul, MD  Diagnosis:   Stage III non-small cell lung cancer  Interval Since Last Radiation:  3  weeks  Narrative:  The patient returns today for routine follow-up.  He continues to have fatigue at this time as well as a productive cough of clear sputum in the early morning. He denies any shortness of breath or hemoptysis. A CT scan of the chest was performed approximately 10 days after his completion of treatment. This showed a partial response to his therapy with no new problems. I would expect continued tumor shrinkage over the next several weeks.                              ALLERGIES:  is allergic to penicillins.  Meds: Current Outpatient Prescriptions  Medication Sig Dispense Refill  . benzonatate (TESSALON) 200 MG capsule Take 200 mg by mouth 3 (three) times daily as needed. For cough      . dextromethorphan-guaiFENesin (MUCINEX DM) 30-600 MG per 12 hr tablet Take 1 tablet by mouth every 12 (twelve) hours.      Marland Kitchen emollient (BIAFINE) cream Apply 1 application topically daily. Apply to affected area on skin after daily rad txs      . oxyCODONE (OXYCONTIN) 10 MG 12 hr tablet Take 1 tablet (10 mg total) by mouth every 12 (twelve) hours.  60 tablet  0  . Oxycodone HCl 10 MG TABS Take 1 tablet by mouth every 6 hours as needed for pain  60 tablet  0  . sucralfate (CARAFATE) 1 GM/10ML suspension Take 10 mLs (1 g total) by mouth 4 (four) times daily.  420 mL  0  . baclofen (LIORESAL) 10 MG tablet TAKE 1 OR 2 TABLETS BY MOUTH EVERY 8 HOURS AS NEEDED FOR HICCUPS  60 tablet  2  . fluconazole (DIFLUCAN) 100 MG tablet Take 2 tablets (200 mg total) by mouth daily.  10 tablet  0  . folic acid (FOLVITE) 1 MG tablet TAKE 1 TABLET BY MOUTH ONCE DAILY  30 tablet  2  . HYDROcodone-homatropine (HYCODAN)  5-1.5 MG/5ML syrup Take 5 mLs by mouth every 6 (six) hours as needed.      Marland Kitchen LORazepam (ATIVAN) 0.5 MG tablet TAKE 1 TABLET BY MOUTH OR SUBLINGUALLY EVERY 8 TO 12 HOURS AS NEEDED FOR ANXIETY OR NAUSEA.  40 tablet  2  . prochlorperazine (COMPAZINE) 10 MG tablet Take 1 tablet (10 mg total) by mouth every 6 (six) hours as needed. For nausea  30 tablet  1  . promethazine (PHENERGAN) 25 MG suppository Place 25 mg rectally every 6 (six) hours as needed. For nausea      . silver sulfADIAZINE (SILVADENE) 1 % cream Apply topically daily.       No current facility-administered medications for this encounter.   Facility-Administered Medications Ordered in Other Encounters  Medication Dose Route Frequency Auri Jahnke Last Rate Last Dose  . topical emolient (BIAFINE) emulsion   Topical Daily Billie Lade, MD        Physical Findings: The patient is in no acute distress. Patient is alert and oriented.  weight is 194 lb 4.8 oz (88.134 kg). His oral temperature is 98.2 F (36.8 C). His blood pressure is 118/80 and his  pulse is 86. His respiration is 18 and oxygen saturation is 98%. .  The oral cavity is moist without secondary infection. The supraclavicular and axillary areas are free of adenopathy. Examination of the lungs reveals them to be clear. The heart has regular rhythm and rate.  Lab Findings: Lab Results  Component Value Date   WBC 5.9 05/11/2012   HGB 12.9* 05/11/2012   HCT 36.5* 05/11/2012   MCV 98.7* 05/11/2012   PLT 153 05/11/2012    @LASTCHEM @  Radiographic Findings: Ct Chest W Contrast  05/08/2012  *RADIOLOGY REPORT*  Clinical Data: Restaging lung cancer  CT CHEST WITH CONTRAST  Technique:  Multidetector CT imaging of the chest was performed following the standard protocol during bolus administration of intravenous contrast.  Contrast: OMNIPAQUE IOHEXOL 300 MG/ML  SOLN  Comparison: CT 12/23/2011.  Findings: No pleural effusion identified.  Perihilar mass within the left lung measures 4.1 x  3.1 cm, image 29/series 5.  This is compared with 4.5 x 4.1 cm previously.  Tiny nodule in the left upper lobe is stable compared with previous exam measuring 3 mm. Within the right upper lobe there is a tiny nodule which measures 3 mm, image 21.  This is also stable from previous exam.   There is a normal heart size.  There is no pericardial effusion. No mediastinal adenopathy.  Index subcarinal lymph node measures 7 mm, image 27.  Previously 8 mm.  Index AP window lymph node measures 8 mm, image 24.  Previously this measured the same.  Imaging through the upper abdomen demonstrates a small 10 mm nodule in the left adrenal gland, image 63/series 2. This may have been present on the previous exam but is more conspicuous on today's study due to the phase of contrast opacification.  The right adrenal gland appears normal.  There are several right posterior rib fractures which appear chronic.  No aggressive lytic or sclerotic bone lesions identified.  IMPRESSION:  1.  No acute findings. 2.  The left lung perihilar mass is slightly decreased in size from previous exam. 3.  Stable sub centimeter mediastinal lymph nodes. 4.  Nodule within the left adrenal gland is indeterminate. Attention on follow-up imaging advised.   Original Report Authenticated By: Signa Kell, M.D.     Impression:  The patient is recovering from the effects of radiation.  Positive response to therapy as above  Plan:  Routine followup in 6 months. Patient will be seen by medical oncology in 3 months.  _____________________________________  -----------------------------------  Billie Lade, PhD, MD

## 2012-05-26 ENCOUNTER — Telehealth: Payer: Self-pay | Admitting: *Deleted

## 2012-05-26 NOTE — Telephone Encounter (Signed)
Pt's wife [name] called requesting letter to be written for her.  She is going to [employer] and would like a letter stating she is pt's sole caregiver.  Letter written, signed and mailed to pt's wife.  SLJ

## 2012-06-08 ENCOUNTER — Other Ambulatory Visit: Payer: Self-pay | Admitting: Medical Oncology

## 2012-06-08 MED ORDER — OXYCODONE HCL 10 MG PO TABS: ORAL_TABLET | ORAL | Status: DC

## 2012-06-08 NOTE — Telephone Encounter (Signed)
rx locked in injection room. 

## 2012-06-14 ENCOUNTER — Emergency Department (HOSPITAL_COMMUNITY)
Admission: EM | Admit: 2012-06-14 | Discharge: 2012-06-15 | Disposition: A | Discharge status: Home / Self Care | Payer: Self-pay | Attending: Emergency Medicine | Admitting: Emergency Medicine

## 2012-06-14 ENCOUNTER — Emergency Department (HOSPITAL_COMMUNITY): Payer: Self-pay

## 2012-06-14 ENCOUNTER — Encounter (HOSPITAL_COMMUNITY): Payer: Self-pay | Admitting: Family Medicine

## 2012-06-14 DIAGNOSIS — Z8701 Personal history of pneumonia (recurrent): Secondary | ICD-10-CM | POA: Insufficient documentation

## 2012-06-14 DIAGNOSIS — Z8719 Personal history of other diseases of the digestive system: Secondary | ICD-10-CM | POA: Insufficient documentation

## 2012-06-14 DIAGNOSIS — Z87891 Personal history of nicotine dependence: Secondary | ICD-10-CM | POA: Insufficient documentation

## 2012-06-14 DIAGNOSIS — C3490 Malignant neoplasm of unspecified part of unspecified bronchus or lung: Secondary | ICD-10-CM | POA: Insufficient documentation

## 2012-06-14 DIAGNOSIS — Z8709 Personal history of other diseases of the respiratory system: Secondary | ICD-10-CM | POA: Insufficient documentation

## 2012-06-14 DIAGNOSIS — Z8679 Personal history of other diseases of the circulatory system: Secondary | ICD-10-CM | POA: Insufficient documentation

## 2012-06-14 DIAGNOSIS — C7970 Secondary malignant neoplasm of unspecified adrenal gland: Secondary | ICD-10-CM | POA: Insufficient documentation

## 2012-06-14 DIAGNOSIS — C7972 Secondary malignant neoplasm of left adrenal gland: Secondary | ICD-10-CM

## 2012-06-14 DIAGNOSIS — Z8619 Personal history of other infectious and parasitic diseases: Secondary | ICD-10-CM | POA: Insufficient documentation

## 2012-06-14 LAB — CBC
Hemoglobin: 12.8 g/dL — ABNORMAL LOW (ref 13.0–17.0)
MCH: 32.7 pg (ref 26.0–34.0)
MCHC: 33.8 g/dL (ref 30.0–36.0)
Platelets: 213 10*3/uL (ref 150–400)
RDW: 13.6 % (ref 11.5–15.5)

## 2012-06-14 LAB — BASIC METABOLIC PANEL
Calcium: 9.6 mg/dL (ref 8.4–10.5)
GFR calc Af Amer: >90 mL/min (ref >90)
GFR calc non Af Amer: >90 mL/min (ref >90)
Potassium: 4.2 mEq/L (ref 3.5–5.1)
Sodium: 135 mEq/L (ref 135–145)

## 2012-06-14 LAB — POCT I-STAT TROPONIN I: Troponin i, poc: 0 ng/mL (ref 0.00–0.08)

## 2012-06-14 LAB — PRO B NATRIURETIC PEPTIDE: Pro B Natriuretic peptide (BNP): 78.3 pg/mL (ref 0–125)

## 2012-06-14 MED ORDER — HYDROMORPHONE HCL PF 1 MG/ML IJ SOLN
1.0000 mg | Freq: Once | INTRAMUSCULAR | Status: AC
  Administered 2012-06-14: 1 mg via INTRAVENOUS
  Filled 2012-06-14: qty 1

## 2012-06-14 MED ORDER — ONDANSETRON HCL 4 MG/2ML IJ SOLN
4.0000 mg | Freq: Once | INTRAMUSCULAR | Status: AC
Start: 2012-06-14 — End: 2012-06-14
  Administered 2012-06-14: 4 mg via INTRAVENOUS
  Filled 2012-06-14: qty 2

## 2012-06-14 MED ORDER — IOHEXOL 300 MG/ML  SOLN
100.0000 mL | Freq: Once | INTRAMUSCULAR | Status: AC | PRN
  Administered 2012-06-14: 100 mL via INTRAVENOUS

## 2012-06-14 MED ORDER — HYDROMORPHONE HCL PF 2 MG/ML IJ SOLN
2.0000 mg | Freq: Once | INTRAMUSCULAR | Status: AC
  Administered 2012-06-14: 2 mg via INTRAVENOUS
  Filled 2012-06-14: qty 1

## 2012-06-14 NOTE — ED Notes (Signed)
Patient states that he started coughing earlier and since he had coughing spell he has been experiencing chest pain. Patient has taken Oxycontin and Oxycodone without relief of symptoms. Patient has a history of lung cancer. Patient also reports he has a history of broken rib due to excessive coughing.

## 2012-06-14 NOTE — ED Provider Notes (Addendum)
History     CSN: [id number]  Arrival date & time 06/14/12  1939   First MD Initiated Contact with Patient 06/14/12 2023      Chief Complaint  Patient presents with  . Chest Pain     HPI Patient states that he started coughing earlier and since he had coughing spell he has been experiencing chest pain.  Patient states this cough and pain has been present for several weeks now.  It is gotten to where his pain medicine will not help.  Patient also has some complaints of pain to the left knee.  He denies any leg swelling.  Patient has taken Oxycontin and Oxycodone without relief of symptoms. Patient has a history of lung cancer. Patient also reports he has a history of broken rib due to excessive coughing  Past Medical History  Diagnosis Date  . PNA (pneumonia)   . Lung mass   . Mitral valve prolapse   . Asthma     as a child  . GERD (gastroesophageal reflux disease)     hx of   . HPV (human papilloma virus) infection     hx of  . Cancer 2013  . Dental disease 2013  September    lower teeth removed.   bone graft   . History of chemotherapy     carboplatin/taxol    Past Surgical History  Procedure Laterality Date  . Appendectomy    . Tonsillectomy    . Video bronchoscopy with endobronchial ultrasound  01/14/2012    Procedure: VIDEO BRONCHOSCOPY WITH ENDOBRONCHIAL ULTRASOUND;  Surgeon: Delight Ovens, MD;  Location: [facility] OR;  Service: Thoracic;  Laterality: N/A;  . Mediastinoscopy  01/14/2012    Procedure: MEDIASTINOSCOPY;  Surgeon: Delight Ovens, MD;  Location: [facility] OR;  Service: Thoracic;  Laterality: N/A;    Family History  Problem Relation Age of Onset  . Diabetes Mother   . Breast cancer Sister   . Breast cancer Sister   . Lung cancer Father     was a smoker  . Colon cancer Paternal Grandmother   . Lung cancer Paternal Grandmother     never a smoker  . Lupus Sister     History  Substance Use Topics  . Smoking status: Former Smoker -- 1.00 packs/day for 30  years    Types: Cigarettes    Quit date: 09/20/2011  . Smokeless tobacco: Never Used  . Alcohol Use: No      Review of Systems  Unable to perform ROS: Acuity of condition    Allergies  Hydrocodone-homatropine and Penicillins  Home Medications   Current Outpatient Rx  Name  Route  Sig  Dispense  Refill  . benzonatate (TESSALON) 200 MG capsule   Oral   Take 200 mg by mouth 3 (three) times daily as needed. For cough         . emollient (BIAFINE) cream   Topical   Apply 1 application topically daily. Apply to affected area on skin after daily rad txs         . oxyCODONE (OXYCONTIN) 10 MG 12 hr tablet   Oral   Take 1 tablet (10 mg total) by mouth every 12 (twelve) hours.   60 tablet   0   . Oxycodone HCl 10 MG TABS      Take 1 tablet by mouth every 6 hours as needed for pain   60 tablet   0   . prochlorperazine (COMPAZINE) 10 MG tablet  Oral   Take 1 tablet (10 mg total) by mouth every 6 (six) hours as needed. For nausea   30 tablet   1   . silver sulfADIAZINE (SILVADENE) 1 % cream   Topical   Apply 1 application topically daily as needed. Radiation burns           BP 121/71  Pulse 83  Temp(Src) 98.6 F (37 C) (Oral)  Resp 19  Ht 5\' 10"  (1.778 m)  Wt 200 lb 3.2 oz (90.81 kg)  BMI 28.73 kg/m2  SpO2 97%  Physical Exam  Nursing note and vitals reviewed. Constitutional: He is oriented to person, place, and time. He appears well-developed and well-nourished. He appears distressed (Has significant left posterior chest pain worse with cough).  HENT:  Head: Normocephalic and atraumatic.  Eyes: Pupils are equal, round, and reactive to light.  Neck: Normal range of motion.  Cardiovascular: Normal rate and intact distal pulses.   Pulmonary/Chest: No respiratory distress. He has rales (Mild).    Patient's area of complaint for pain  Abdominal: Normal appearance. He exhibits no distension.  Musculoskeletal: Normal range of motion.  Neurological: He is  alert and oriented to person, place, and time. No cranial nerve deficit.  Skin: Skin is warm and dry. No rash noted.  Psychiatric: He has a normal mood and affect. His behavior is normal.    ED Course  Procedures (including critical care time)  Date: 06/14/2012  Rate: 89  Rhythm: normal sinus rhythm  QRS Axis: normal  Intervals: normal  ST/T Wave abnormalities: normal  Conduction Disutrbances: Right bundle branch block  Narrative Interpretation: No significant changes when compared to previous EKG   Meds ordered this encounter  Medications  . HYDROmorphone (DILAUDID) injection 2 mg    Sig:   . ondansetron (ZOFRAN) injection 4 mg    Sig:   . HYDROmorphone (DILAUDID) injection 1 mg    Sig:      Labs Reviewed  CBC - Abnormal; Notable for the following:    RBC 3.91 (*)    Hemoglobin 12.8 (*)    HCT 37.9 (*)    All other components within normal limits  BASIC METABOLIC PANEL - Abnormal; Notable for the following:    Glucose, Bld 105 (*)    All other components within normal limits  PRO B NATRIURETIC PEPTIDE  POCT I-STAT TROPONIN I   Dg Ribs Unilateral Left  06/14/2012     IMPRESSION:  1.  Possible slight focal expansion of the left posterolateral ninth rib, which could reflect a metastatic lesion, or possibly sequelae of remote injury, though not present on the CT from 05/08/2012.  No definite rib fractures seen. 2.  Vascular congestion again noted; left perihilar mass again seen.   Original Report Authenticated By: Tonia Ghent, M.D.    Ct Chest W Contrast  06/14/2012  *RADIOLOGY REPORT*  Clinical Data: Left lower rib pain.  Lung cancer.  CT CHEST WITH CONTRAST  Technique:    IMPRESSION:   1.  Increased size of the left perihilar mass compatible with progression of disease. 2.  Additional airspace opacities surrounding this may represent postobstructive disease versus radiation change.  3. Increased pleural disease and small left effusion are also worrisome for  progression of disease. 4.  New ill-defined opacities within the lingula are worrisome for additional metastases.  A typical infection is also considered.  5.  No focal abnormalities of the left ribs. 6.  Nonhealed right eighth and ninth rib fractures. 7.  Increasing size of a left adrenal lesion and likely metastasis.    Original Report Authenticated By: Marin Roberts, M.D.    Dg Chest Port 1 View  06/14/2012  *RADIOLOGY REPORT*  Clinical Data: Chest pain.  PORTABLE CHEST - 1 VIEW  Comparison: CT chest 05/08/2012.    IMPRESSION:  1.  New pulmonary vascular congestion. 2.  Persistent left perihilar mass. 3.  Remote right-sided rib fractures.   Original Report Authenticated By: Marin Roberts, M.D.    Dg Knee Complete 4 Views Left  06/14/2012  *RADIOLOGY REPORT*  Clinical Data: Left knee pain and swelling.  LEFT KNEE - COMPLETE 4+ VIEW  Comparison: None.  Findings: There is no evidence of fracture or dislocation.  The joint spaces are preserved.  Minimal osteophyte formation is noted at the tibial spine; the patellofemoral joint is grossly unremarkable in appearance.  A small sclerotic focus at the proximal tibia has a benign appearance.  Trace knee joint fluid remains within normal limits.  The visualized soft tissues are normal in appearance.  IMPRESSION: No evidence of fracture or dislocation.   Original Report Authenticated By: Tonia Ghent, M.D.      1. 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  After discussing the findings with the patient and family and giving them option of being admitted or going home, they want to go home with pain management and will follow up with Dr. Gwenyth Bouillon tomorrow        Nelia Shi, MD 06/15/12 0002  Nelia Shi, MD 06/15/12 2234

## 2012-06-14 NOTE — ED Notes (Signed)
Pt has hx of broken rib due to this same cough. Pt denies spontaneous pneumothorax.

## 2012-06-15 ENCOUNTER — Telehealth: Payer: Self-pay | Admitting: *Deleted

## 2012-06-15 MED ORDER — FENTANYL 25 MCG/HR TD PT72
25.0000 ug | MEDICATED_PATCH | TRANSDERMAL | Status: DC
  Administered 2012-06-15: 25 ug via TRANSDERMAL

## 2012-06-15 MED ORDER — HYDROMORPHONE HCL 4 MG PO TABS: 4.0000 mg | ORAL_TABLET | ORAL | Status: DC | PRN

## 2012-06-15 MED ORDER — GI COCKTAIL ~~LOC~~
30.0000 mL | Freq: Once | ORAL | Status: AC
  Administered 2012-06-15: 30 mL via ORAL
  Filled 2012-06-15: qty 30

## 2012-06-15 MED ORDER — HYDROMORPHONE HCL PF 1 MG/ML IJ SOLN
1.0000 mg | Freq: Once | INTRAMUSCULAR | Status: AC
  Administered 2012-06-15: 1 mg via INTRAVENOUS
  Filled 2012-06-15: qty 1

## 2012-06-15 NOTE — Telephone Encounter (Signed)
Pt's wife called stating that pt was in the ED, he had a scan done and pt needs to be seen ASAP because the tumor has gotten bigger.  Per Dr Donnald Garre, scan was reviewed and pt does not need to be urgently seen but can be seen in the next week.  Appt given to pt's wife for 4/14.  SLJ

## 2012-06-17 ENCOUNTER — Telehealth: Payer: Self-pay | Admitting: *Deleted

## 2012-06-17 NOTE — Telephone Encounter (Signed)
Pt's wife called wanting to know what the plan is regarding pt having CT scan changes.  Per Dr Donnald Garre, pt has f/u to discuss how to proceed on Monday, 4/14.  Informed Mrs. Pritchard that Dr Donnald Garre will discuss any questions/concerns she has at the next f/u appt.  She verbalized understanding.  SLJ

## 2012-06-22 ENCOUNTER — Ambulatory Visit (HOSPITAL_BASED_OUTPATIENT_CLINIC_OR_DEPARTMENT_OTHER): Payer: Medicaid Other | Admitting: Internal Medicine

## 2012-06-22 ENCOUNTER — Other Ambulatory Visit: Payer: Self-pay | Admitting: Medical Oncology

## 2012-06-22 ENCOUNTER — Encounter: Payer: Self-pay | Admitting: Internal Medicine

## 2012-06-22 ENCOUNTER — Telehealth: Payer: Self-pay | Admitting: Internal Medicine

## 2012-06-22 DIAGNOSIS — R059 Cough, unspecified: Secondary | ICD-10-CM

## 2012-06-22 DIAGNOSIS — R05 Cough: Secondary | ICD-10-CM

## 2012-06-22 DIAGNOSIS — R918 Other nonspecific abnormal finding of lung field: Secondary | ICD-10-CM

## 2012-06-22 DIAGNOSIS — C3430 Malignant neoplasm of lower lobe, unspecified bronchus or lung: Secondary | ICD-10-CM

## 2012-06-22 DIAGNOSIS — R52 Pain, unspecified: Secondary | ICD-10-CM

## 2012-06-22 MED ORDER — DOXYCYCLINE HYCLATE 100 MG PO TABS: 100.0000 mg | ORAL_TABLET | Freq: Two times a day (BID) | ORAL | Status: DC

## 2012-06-22 MED ORDER — HYDROMORPHONE HCL 4 MG PO TABS: 4.0000 mg | ORAL_TABLET | ORAL | Status: DC | PRN

## 2012-06-22 MED ORDER — OXYCODONE HCL 10 MG PO TB12: 10.0000 mg | ORAL_TABLET | Freq: Two times a day (BID) | ORAL | Status: DC

## 2012-06-22 NOTE — Progress Notes (Signed)
[facility] Telephone:([phone]) [phone]   Fax:([phone]  OFFICE PROGRESS NOTE  DIAGNOSIS: Stage IIB/IIIA non-small cell lung cancer, adenocarcinoma with a large left perihilar mass and questionable mediastinal lymphadenopathy diagnosed in July of 2013   PRIOR THERAPY:  1) Neoadjuvant chemotherapy with cisplatin 75 mg/M2 and Alimta 500 mg/M2 given every 3 weeks. The patient is status post 3 cycles.  2) Concurrent chemoradiation with weekly carboplatin for AUC of 2 and paclitaxel 45 mg/M2, last dose was given 03/30/2012 with partial response.   CURRENT THERAPY: Observation.  INTERVAL HISTORY: Roger Mckinney 52 y.o. male returns to the clinic today for followup visit accompanied his wife. The patient still complaining of dry cough but no significant chest pain, shortness breath or hemoptysis. He denied having any significant weight loss or night sweats. He was seen recently at the emergency Department complaining of chest pain and during his evaluation and repeat CT scan of the chest was performed and it showed increased in the size of the left perihilar mass compatible with progression of disease. There is also increased pleural disease and a small left effusion in addition to innumerable ill-defined opacities within the lingula worrisome for additional metastasis. The scan also showed increasing size of left adrenal lesion likely metastasis. The patient requested followup visit for evaluation and discussion of his scan results. He has no other significant complaints today.  MEDICAL HISTORY: Past Medical History  Diagnosis Date  . PNA (pneumonia)   . Lung mass   . Mitral valve prolapse   . Asthma     as a child  . GERD (gastroesophageal reflux disease)     hx of   . HPV (human papilloma virus) infection     hx of  . Cancer 2013  . Dental disease 2013  September    lower teeth removed.   bone graft   . History of chemotherapy     carboplatin/taxol    ALLERGIES:  is  allergic to hydrocodone-homatropine and penicillins.  MEDICATIONS:  Current Outpatient Prescriptions  Medication Sig Dispense Refill  . benzonatate (TESSALON) 200 MG capsule Take 200 mg by mouth 3 (three) times daily as needed. For cough      . emollient (BIAFINE) cream Apply 1 application topically daily. Apply to affected area on skin after daily rad txs      . HYDROmorphone (DILAUDID) 4 MG tablet Take 1 tablet (4 mg total) by mouth every 4 (four) hours as needed for pain.  30 tablet  0  . oxyCODONE (OXYCONTIN) 10 MG 12 hr tablet Take 1 tablet (10 mg total) by mouth every 12 (twelve) hours.  60 tablet  0  . Oxycodone HCl 10 MG TABS Take 1 tablet by mouth every 6 hours as needed for pain  60 tablet  0  . prochlorperazine (COMPAZINE) 10 MG tablet Take 1 tablet (10 mg total) by mouth every 6 (six) hours as needed. For nausea  30 tablet  1  . silver sulfADIAZINE (SILVADENE) 1 % cream Apply 1 application topically daily as needed. Radiation burns       No current facility-administered medications for this visit.   Facility-Administered Medications Ordered in Other Visits  Medication Dose Route Frequency Provider Last Rate Last Dose  . topical emolient (BIAFINE) emulsion   Topical Daily Billie Lade, MD        SURGICAL HISTORY:  Past Surgical History  Procedure Laterality Date  . Appendectomy    . Tonsillectomy    .  Video bronchoscopy with endobronchial ultrasound  01/14/2012    Procedure: VIDEO BRONCHOSCOPY WITH ENDOBRONCHIAL ULTRASOUND;  Surgeon: Delight Ovens, MD;  Location: [facility] OR;  Service: Thoracic;  Laterality: N/A;  . Mediastinoscopy  01/14/2012    Procedure: MEDIASTINOSCOPY;  Surgeon: Delight Ovens, MD;  Location: [facility] OR;  Service: Thoracic;  Laterality: N/A;    REVIEW OF SYSTEMS:  A comprehensive review of systems was negative except for: Respiratory: positive for cough   PHYSICAL EXAMINATION: General appearance: alert, cooperative, fatigued and no distress Head:  Normocephalic, without obvious abnormality, atraumatic Neck: no adenopathy Lymph nodes: Cervical, supraclavicular, and axillary nodes normal. Resp: clear to auscultation bilaterally Cardio: regular rate and rhythm, S1, S2 normal, no murmur, click, rub or gallop GI: soft, non-tender; bowel sounds normal; no masses,  no organomegaly Extremities: extremities normal, atraumatic, no cyanosis or edema Neurologic: Alert and oriented X 3, normal strength and tone. Normal symmetric reflexes. Normal coordination and gait  ECOG PERFORMANCE STATUS: 1 - Symptomatic but completely ambulatory  Blood pressure 115/76, pulse 92, temperature 98 F (36.7 C), temperature source Oral, resp. rate 18, height 5\' 10"  (1.778 m), weight 199 lb 12.8 oz (90.629 kg).  LABORATORY DATA: Lab Results  Component Value Date   WBC 7.8 06/14/2012   HGB 12.8* 06/14/2012   HCT 37.9* 06/14/2012   MCV 96.9 06/14/2012   PLT 213 06/14/2012      Chemistry      Component Value Date/Time   NA 135 06/14/2012 2012   NA 139 05/11/2012 0854   K 4.2 06/14/2012 2012   K 3.9 05/11/2012 0854   CL 99 06/14/2012 2012   CL 106 05/11/2012 0854   CO2 24 06/14/2012 2012   CO2 24 05/11/2012 0854   BUN 15 06/14/2012 2012   BUN 12.9 05/11/2012 0854   CREATININE 0.95 06/14/2012 2012   CREATININE 1.1 05/11/2012 0854      Component Value Date/Time   CALCIUM 9.6 06/14/2012 2012   CALCIUM 10.1 05/11/2012 0854   ALKPHOS 92 05/11/2012 0854   ALKPHOS 111 01/13/2012 0849   AST 15 05/11/2012 0854   AST 17 01/13/2012 0849   ALT 14 05/11/2012 0854   ALT 9 01/13/2012 0849   BILITOT 0.51 05/11/2012 0854   BILITOT 0.6 01/13/2012 0849       RADIOGRAPHIC STUDIES: Dg Ribs Unilateral Left  06/14/2012  *RADIOLOGY REPORT*  Clinical Data: Left-sided rib pain and cough.  History of lung cancer, on chemotherapy and radiation therapy.  LEFT RIBS - 2 VIEW  Comparison: Chest radiograph performed earlier today at 08:22 p.m.  Findings: There may be slight focal expansion of the left posterolateral  ninth rib, which could reflect a metastatic lesion, or possibly remote injury, not present on the CT from 05/08/2012.  Vascular congestion is again noted; the patient's left perihilar mass is again seen.  No pleural effusion or pneumothorax is identified at the left lung.  The mediastinum is grossly unremarkable in appearance.  IMPRESSION:  1.  Possible slight focal expansion of the left posterolateral ninth rib, which could reflect a metastatic lesion, or possibly sequelae of remote injury, though not present on the CT from 05/08/2012.  No definite rib fractures seen. 2.  Vascular congestion again noted; left perihilar mass again seen.   Original Report Authenticated By: Tonia Ghent, M.D.    Ct Chest W Contrast  06/14/2012  *RADIOLOGY REPORT*  Clinical Data: Left lower rib pain.  Lung cancer.  CT CHEST WITH CONTRAST  Technique:  Multidetector CT  imaging of the chest was performed following the standard protocol during bolus administration of intravenous contrast.  Contrast: OMNIPAQUE IOHEXOL 300 MG/ML  SOLN  Comparison: One-view chest and left rib radiographs 06/14/2012.  Findings: Left perihilar mass is increased in size, now measuring 4.0 x 5.0 centimeters.  Left pleural thickening and effusion has increased.  Additional surrounding airspace disease is evident. There is progression of ill-defined opacity in the lingula, measuring 4.0 x 2.2 cm on the axial images.  Areas of dependent atelectasis are more prominent bilaterally.  Remote nonhealed right eighth and ninth rib fractures are again seen.  No significant osseous abnormalities are present on the left.  No significant mediastinal or axillary adenopathy is present.  The thoracic inlet is within normal limits.  A left adrenal mass has increased in size, now measuring 17 x 21 mm.  Limited imaging of the upper abdomen is otherwise unremarkable.  IMPRESSION:  1.  Increased size of the left perihilar mass compatible with progression of disease. 2.   Additional airspace opacities surrounding this may represent postobstructive disease versus radiation change. 3. Increased pleural disease and small left effusion are also worrisome for progression of disease. 4.  New ill-defined opacities within the lingula are worrisome for additional metastases.  A typical infection is also considered. 5.  No focal abnormalities of the left ribs. 6.  Nonhealed right eighth and ninth rib fractures. 7.  Increasing size of a left adrenal lesion and likely metastasis.   Original Report Authenticated By: Marin Roberts, M.D.    Dg Chest Port 1 View  06/14/2012  *RADIOLOGY REPORT*  Clinical Data: Chest pain.  PORTABLE CHEST - 1 VIEW  Comparison: CT chest 05/08/2012.  Two-view chest x-ray 01/13/2012.  Findings: A left perihilar mass is again noted.  Mild pulmonary vascular congestion is now present bilaterally.  Remote right-sided rib fractures are again seen.  IMPRESSION:  1.  New pulmonary vascular congestion. 2.  Persistent left perihilar mass. 3.  Remote right-sided rib fractures.   Original Report Authenticated By: Marin Roberts, M.D.    Dg Knee Complete 4 Views Left  06/14/2012  *RADIOLOGY REPORT*  Clinical Data: Left knee pain and swelling.  LEFT KNEE - COMPLETE 4+ VIEW  Comparison: None.  Findings: There is no evidence of fracture or dislocation.  The joint spaces are preserved.  Minimal osteophyte formation is noted at the tibial spine; the patellofemoral joint is grossly unremarkable in appearance.  A small sclerotic focus at the proximal tibia has a benign appearance.  Trace knee joint fluid remains within normal limits.  The visualized soft tissues are normal in appearance.  IMPRESSION: No evidence of fracture or dislocation.   Original Report Authenticated By: Tonia Ghent, M.D.     ASSESSMENT: This is a very pleasant 52 years old white male with stage IIIa non-small cell lung cancer status post neoadjuvant chemotherapy followed by a course of concurrent  chemoradiation. The patient has some evidence for disease progression on his recent scan compared to a scan performed a month before but I cannot rule out any other inflammatory process at this point especially with a short interval between the 2 scans.    PLAN: I have a lengthy discussion with the patient and his wife today about his condition. I recommended for the patient to have a PET scan further evaluation of his disease and confirm the presence of disease progression. In the meantime I started the patient on doxycycline 100 mg by mouth twice a day for the questionable  inflammatory process and persistent cough. For pain management he was given a refill of OxyContin 10 mg by mouth every 12 hours in addition to Dilaudid 4 mg by mouth every 4 hours as needed for pain. 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 I would see him back for followup visit in one week for evaluation and discussion of his scan results and further recommendation regarding his condition. He was advised to call immediately if he has any concerning symptoms in the interval  All questions were answered. The patient knows to call the clinic with any problems, questions or concerns. We can certainly see the patient much sooner if necessary.  I spent 15 minutes counseling the patient face to face. The total time spent in the appointment was 25 minutes.

## 2012-06-22 NOTE — Patient Instructions (Addendum)
CT scan of the chest showed question of new disease progression. I ordered a PET scan for evaluation of her disease. Followup visit in one week.

## 2012-06-22 NOTE — Telephone Encounter (Signed)
Refill for oxycontin and dilaudid. Dilaudid works better than Oxycodone IR

## 2012-06-22 NOTE — Telephone Encounter (Signed)
lmonvm for pt confirming appt for 4/23. [facility] will call w/appt for pet.

## 2012-06-23 ENCOUNTER — Telehealth: Payer: Self-pay | Admitting: Internal Medicine

## 2012-06-23 NOTE — Telephone Encounter (Signed)
S/W THE PT'S WIFE AND SHE IS AWARE OF THE APPT ON 07/01/2012@12 :NOON

## 2012-06-26 ENCOUNTER — Encounter (HOSPITAL_COMMUNITY)
Admission: RE | Admit: 2012-06-26 | Discharge: 2012-06-26 | Disposition: A | Discharge status: Home / Self Care | Payer: Self-pay | Source: Ambulatory Visit | Attending: Internal Medicine | Admitting: Internal Medicine

## 2012-06-26 DIAGNOSIS — J700 Acute pulmonary manifestations due to radiation: Secondary | ICD-10-CM | POA: Insufficient documentation

## 2012-06-26 DIAGNOSIS — C7970 Secondary malignant neoplasm of unspecified adrenal gland: Secondary | ICD-10-CM | POA: Insufficient documentation

## 2012-06-26 DIAGNOSIS — C7951 Secondary malignant neoplasm of bone: Secondary | ICD-10-CM | POA: Insufficient documentation

## 2012-06-26 DIAGNOSIS — Y842 Radiological procedure and radiotherapy as the cause of abnormal reaction of the patient, or of later complication, without mention of misadventure at the time of the procedure: Secondary | ICD-10-CM | POA: Insufficient documentation

## 2012-06-26 DIAGNOSIS — C786 Secondary malignant neoplasm of retroperitoneum and peritoneum: Secondary | ICD-10-CM | POA: Insufficient documentation

## 2012-06-26 DIAGNOSIS — C3490 Malignant neoplasm of unspecified part of unspecified bronchus or lung: Secondary | ICD-10-CM | POA: Insufficient documentation

## 2012-06-26 MED ORDER — FLUDEOXYGLUCOSE F - 18 (FDG) INJECTION
19.6000 | Freq: Once | INTRAVENOUS | Status: AC | PRN
  Administered 2012-06-26: 19.6 via INTRAVENOUS

## 2012-07-01 ENCOUNTER — Telehealth: Payer: Self-pay | Admitting: *Deleted

## 2012-07-01 ENCOUNTER — Telehealth: Payer: Self-pay | Admitting: Internal Medicine

## 2012-07-01 ENCOUNTER — Ambulatory Visit (HOSPITAL_BASED_OUTPATIENT_CLINIC_OR_DEPARTMENT_OTHER): Payer: Medicaid Other | Admitting: Internal Medicine

## 2012-07-01 ENCOUNTER — Encounter: Payer: Self-pay | Admitting: Internal Medicine

## 2012-07-01 DIAGNOSIS — C3490 Malignant neoplasm of unspecified part of unspecified bronchus or lung: Secondary | ICD-10-CM

## 2012-07-01 MED ORDER — DEXAMETHASONE 4 MG PO TABS: 4.0000 mg | ORAL_TABLET | Freq: Two times a day (BID) | ORAL | Status: DC

## 2012-07-01 MED ORDER — HYDROMORPHONE HCL 4 MG PO TABS: 4.0000 mg | ORAL_TABLET | ORAL | Status: DC | PRN

## 2012-07-01 MED ORDER — FLUCONAZOLE 200 MG PO TABS: 200.0000 mg | ORAL_TABLET | Freq: Every day | ORAL | Status: DC

## 2012-07-01 NOTE — Telephone Encounter (Signed)
Per staff message and POF I have scheduled appts. MD appt on 5/6 to late for 6 hr treatment, I have sent the scheduler to advise new appt time.  JMW

## 2012-07-01 NOTE — Telephone Encounter (Signed)
MD appt changed and I scheduled treatment.  JMW

## 2012-07-01 NOTE — Patient Instructions (Signed)
Unfortunately PET scan showed evidence for disease progression. We discussed systemic chemotherapy with carboplatin, paclitaxel and Avastin. First cycle scheduled for Jul 14, 2012. Followup visit at that time.

## 2012-07-01 NOTE — Telephone Encounter (Signed)
S/w the pt's wife and they have agreed to come in the day before for the lab and the md visit on 07/13/2012 and do the tx on 07/14/2012 only.

## 2012-07-01 NOTE — Progress Notes (Signed)
[facility] [phone]   [phone]  OFFICE PROGRESS NOTE  DIAGNOSIS: Stage IIB/IIIA non-small cell lung cancer, adenocarcinoma with a large left perihilar mass and questionable mediastinal lymphadenopathy diagnosed in July of 2013   PRIOR THERAPY:  1) Neoadjuvant chemotherapy with cisplatin 75 mg/M2 and Alimta 500 mg/M2 given every 3 weeks. The patient is status post 3 cycles.  2) Concurrent chemoradiation with weekly carboplatin for AUC of 2 and paclitaxel 45 mg/M2, last dose was given 03/30/2012 with partial response.   CURRENT THERAPY: Observation.  INTERVAL HISTORY: Roger Mckinney 52 y.o. male returns to the clinic today for followup visit accompanied by his wife. The patient is feeling fine today but continues to have dry cough with left-sided chest pain associated with the cough. He also has mild pain in the left hip area. He continues to have mild shortness breath with exertion but no significant hemoptysis. The patient denied having any significant weight loss or night sweats. He had a PET scan performed recently and he is here for evaluation and discussion of his scan results and recommendation regarding treatment of his condition.  MEDICAL HISTORY: Past Medical History  Diagnosis Date  . PNA (pneumonia)   . Lung mass   . Mitral valve prolapse   . Asthma     as a child  . GERD (gastroesophageal reflux disease)     hx of   . HPV (human papilloma virus) infection     hx of  . Cancer 2013  . Dental disease 2013  September    lower teeth removed.   bone graft   . History of chemotherapy     carboplatin/taxol    ALLERGIES:  is allergic to hydrocodone-homatropine and penicillins.  MEDICATIONS:  Current Outpatient Prescriptions  Medication Sig Dispense Refill  . benzonatate (TESSALON) 200 MG capsule Take 200 mg by mouth 3 (three) times daily as needed. For cough      . HYDROmorphone (DILAUDID) 4 MG tablet Take 1 tablet (4 mg total) by  mouth every 4 (four) hours as needed for pain.  30 tablet  0  . oxyCODONE (OXYCONTIN) 10 MG 12 hr tablet Take 1 tablet (10 mg total) by mouth every 12 (twelve) hours.  60 tablet  0  . doxycycline (VIBRA-TABS) 100 MG tablet Take 1 tablet (100 mg total) by mouth 2 (two) times daily.  28 tablet  0  . Oxycodone HCl 10 MG TABS Take 1 tablet by mouth every 6 hours as needed for pain  60 tablet  0   No current facility-administered medications for this visit.   Facility-Administered Medications Ordered in Other Visits  Medication Dose Route Frequency Provider Last Rate Last Dose  . topical emolient (BIAFINE) emulsion   Topical Daily Billie Lade, MD        SURGICAL HISTORY:  Past Surgical History  Procedure Laterality Date  . Appendectomy    . Tonsillectomy    . Video bronchoscopy with endobronchial ultrasound  01/14/2012    Procedure: VIDEO BRONCHOSCOPY WITH ENDOBRONCHIAL ULTRASOUND;  Surgeon: Delight Ovens, MD;  Location: [facility] OR;  Service: Thoracic;  Laterality: N/A;  . Mediastinoscopy  01/14/2012    Procedure: MEDIASTINOSCOPY;  Surgeon: Delight Ovens, MD;  Location: [facility] OR;  Service: Thoracic;  Laterality: N/A;    REVIEW OF SYSTEMS:  A comprehensive review of systems was negative except for: Constitutional: positive for fatigue Respiratory: positive for cough and dyspnea on exertion Musculoskeletal: positive for bone pain  PHYSICAL EXAMINATION: General appearance: alert, cooperative, fatigued and no distress Head: Normocephalic, without obvious abnormality, atraumatic Neck: no adenopathy Lymph nodes: Cervical, supraclavicular, and axillary nodes normal. Resp: clear to auscultation bilaterally Cardio: regular rate and rhythm, S1, S2 normal, no murmur, click, rub or gallop GI: soft, non-tender; bowel sounds normal; no masses,  no organomegaly Extremities: extremities normal, atraumatic, no cyanosis or edema Neurologic: Alert and oriented X 3, normal strength and tone. Normal  symmetric reflexes. Normal coordination and gait  ECOG PERFORMANCE STATUS: 1 - Symptomatic but completely ambulatory  Blood pressure 113/78, pulse 92, temperature 98 F (36.7 C), temperature source Oral, resp. rate 18, height 5\' 10"  (1.778 m), weight 202 lb 12.8 oz (91.989 kg).  LABORATORY DATA: Lab Results  Component Value Date   WBC 7.8 06/14/2012   HGB 12.8* 06/14/2012   HCT 37.9* 06/14/2012   MCV 96.9 06/14/2012   PLT 213 06/14/2012      Chemistry      Component Value Date/Time   NA 135 06/14/2012 2012   NA 139 05/11/2012 0854   K 4.2 06/14/2012 2012   K 3.9 05/11/2012 0854   CL 99 06/14/2012 2012   CL 106 05/11/2012 0854   CO2 24 06/14/2012 2012   CO2 24 05/11/2012 0854   BUN 15 06/14/2012 2012   BUN 12.9 05/11/2012 0854   CREATININE 0.95 06/14/2012 2012   CREATININE 1.1 05/11/2012 0854      Component Value Date/Time   CALCIUM 9.6 06/14/2012 2012   CALCIUM 10.1 05/11/2012 0854   ALKPHOS 92 05/11/2012 0854   ALKPHOS 111 01/13/2012 0849   AST 15 05/11/2012 0854   AST 17 01/13/2012 0849   ALT 14 05/11/2012 0854   ALT 9 01/13/2012 0849   BILITOT 0.51 05/11/2012 0854   BILITOT 0.6 01/13/2012 0849       RADIOGRAPHIC STUDIES: Dg Ribs Unilateral Left  06/14/2012  *RADIOLOGY REPORT*  Clinical Data: Left-sided rib pain and cough.  History of lung cancer, on chemotherapy and radiation therapy.  LEFT RIBS - 2 VIEW  Comparison: Chest radiograph performed earlier today at 08:22 p.m.  Findings: There may be slight focal expansion of the left posterolateral ninth rib, which could reflect a metastatic lesion, or possibly remote injury, not present on the CT from 05/08/2012.  Vascular congestion is again noted; the patient's left perihilar mass is again seen.  No pleural effusion or pneumothorax is identified at the left lung.  The mediastinum is grossly unremarkable in appearance.  IMPRESSION:  1.  Possible slight focal expansion of the left posterolateral ninth rib, which could reflect a metastatic lesion, or possibly sequelae  of remote injury, though not present on the CT from 05/08/2012.  No definite rib fractures seen. 2.  Vascular congestion again noted; left perihilar mass again seen.   Original Report Authenticated By: Tonia Ghent, M.D.    Ct Chest W Contrast  06/14/2012  *RADIOLOGY REPORT*  Clinical Data: Left lower rib pain.  Lung cancer.  CT CHEST WITH CONTRAST  Technique:  Multidetector CT imaging of the chest was performed following the standard protocol during bolus administration of intravenous contrast.  Contrast: OMNIPAQUE IOHEXOL 300 MG/ML  SOLN  Comparison: One-view chest and left rib radiographs 06/14/2012.  Findings: Left perihilar mass is increased in size, now measuring 4.0 x 5.0 centimeters.  Left pleural thickening and effusion has increased.  Additional surrounding airspace disease is evident. There is progression of ill-defined opacity in the lingula, measuring 4.0 x 2.2 cm on the  axial images.  Areas of dependent atelectasis are more prominent bilaterally.  Remote nonhealed right eighth and ninth rib fractures are again seen.  No significant osseous abnormalities are present on the left.  No significant mediastinal or axillary adenopathy is present.  The thoracic inlet is within normal limits.  A left adrenal mass has increased in size, now measuring 17 x 21 mm.  Limited imaging of the upper abdomen is otherwise unremarkable.  IMPRESSION:  1.  Increased size of the left perihilar mass compatible with progression of disease. 2.  Additional airspace opacities surrounding this may represent postobstructive disease versus radiation change. 3. Increased pleural disease and small left effusion are also worrisome for progression of disease. 4.  New ill-defined opacities within the lingula are worrisome for additional metastases.  A typical infection is also considered. 5.  No focal abnormalities of the left ribs. 6.  Nonhealed right eighth and ninth rib fractures. 7.  Increasing size of a left adrenal lesion  and likely metastasis.   Original Report Authenticated By: Marin Roberts, M.D.    Nm Pet Image Restag (ps) Skull Base To Thigh  06/26/2012  *RADIOLOGY REPORT*  Clinical Data: Subsequent treatment strategy for lung cancer.  NUCLEAR MEDICINE PET SKULL BASE TO THIGH  Fasting Blood Glucose:  108  Technique:  19.6 mCi F-18 FDG was injected intravenously. CT data was obtained and used for attenuation correction and anatomic localization only.  (This was not acquired as a diagnostic CT examination.) Additional exam technical data entered on technologist worksheet.  Comparison:  01/01/2012.  Findings:  Neck: No hypermetabolic lymph nodes in the neck. Persistent metabolically active left parotid gland lesion.  No change in size.  Chest:  The large left infrahilar mass is slightly smaller.  It measures 4.6 x 3.3 cm and previously measured 5.8 x 3.6 cm SUV max has decreased from 21.9 213.3.  No hypermetabolic mediastinal or hilar lymph nodes.  There is new surrounding radiation pneumonitis with FDG uptake.  Abdomen/Pelvis:  There is a new hypermetabolic left adrenal gland mass consistent with metastasis. There is also a new soft tissue mass near the inferior tip of the liver on image number 170 which shows FDG uptake consistent with an omental implant.  It measures a maximum of 15.4 mm and SUV Max is 13.8.  No definite liver metastasis.  Skeleton:  There is a area of increased FDG uptake in the left acetabulum laterally which is worrisome for a osseous metastasis. I do not see any obvious abnormality on the CT scan but a cortical mass is likely.  No other obvious bone lesions.  IMPRESSION:  1.  Slight interval decrease in size of the left lung mass and decrease in SUV max as described above. 2.  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 3.  New left adrenal gland metastasis. 4.  New omental metastatic lesion. 5.  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   Original Report Authenticated By: Rudie Meyer,  M.D.    Dg Chest Port 1 View  06/14/2012  *RADIOLOGY REPORT*  Clinical Data: Chest pain.  PORTABLE CHEST - 1 VIEW  Comparison: CT chest 05/08/2012.  Two-view chest x-ray 01/13/2012.  Findings: A left perihilar mass is again noted.  Mild pulmonary vascular congestion is now present bilaterally.  Remote right-sided rib fractures are again seen.  IMPRESSION:  1.  New pulmonary vascular congestion. 2.  Persistent left perihilar mass. 3.  Remote right-sided rib fractures.   Original Report Authenticated By: Marin Roberts, M.D.  Dg Knee Complete 4 Views Left  06/14/2012  *RADIOLOGY REPORT*  Clinical Data: Left knee pain and swelling.  LEFT KNEE - COMPLETE 4+ VIEW  Comparison: None.  Findings: There is no evidence of fracture or dislocation.  The joint spaces are preserved.  Minimal osteophyte formation is noted at the tibial spine; the patellofemoral joint is grossly unremarkable in appearance.  A small sclerotic focus at the proximal tibia has a benign appearance.  Trace knee joint fluid remains within normal limits.  The visualized soft tissues are normal in appearance.  IMPRESSION: No evidence of fracture or dislocation.   Original Report Authenticated By: Tonia Ghent, M.D.     ASSESSMENT: This is a very pleasant 52 years old white male now with metastatic non-small cell lung cancer, adenocarcinoma initially diagnosed as stage IIIa status post neoadjuvant chemotherapy followed by concurrent chemoradiation. The recent PET scan showed evidence for metastatic disease involving the left adrenal gland, new omental metastatic lesion as well as new metastatic bone lesion involving the left acetabulum.   PLAN: I have a lengthy discussion with the patient and his wife today about his condition. I recommended for the patient systemic chemotherapy with carboplatin for AUC of 5, paclitaxel 175 mg/M2 and Avastin 15 mg/dL with Neulasta support every 3 weeks. I discussed with the patient adverse effect of this  treatment including but not limited to alopecia, myelosuppression, nausea vomiting, peripheral neuropathy, liver or renal dysfunction. For the radiation induced pneumonitis and dry cough, I recommended for the patient to start Decadron 4 mg by mouth twice a day. He has some concern about oral thrush and I gave him prescription for Diflucan 200 mg by mouth daily to be started immediately if he develops any oral thrush. I also advise him to start the treatment with doxycycline 100 mg by mouth twice a day for 2 weeks. He is expected to start the first cycle of his systemic chemotherapy on 07/14/2012. He would come back for followup visit at that time. The patient was given a refill for Dilaudid today. He was advised to call immediately if he has any concerning symptoms in the interval.  All questions were answered. The patient knows to call the clinic with any problems, questions or concerns. We can certainly see the patient much sooner if necessary.  I spent 20 minutes counseling the patient face to face. The total time spent in the appointment was 30 minutes.

## 2012-07-06 ENCOUNTER — Ambulatory Visit: Payer: Self-pay | Admitting: Internal Medicine

## 2012-07-08 ENCOUNTER — Telehealth: Payer: Self-pay | Admitting: Medical Oncology

## 2012-07-08 MED ORDER — BENZONATATE 200 MG PO CAPS: 200.0000 mg | ORAL_CAPSULE | Freq: Three times a day (TID) | ORAL | Status: DC | PRN

## 2012-07-08 NOTE — Telephone Encounter (Signed)
Requests refill for tesslon -called to [facility] pt and pt.

## 2012-07-13 ENCOUNTER — Ambulatory Visit (HOSPITAL_BASED_OUTPATIENT_CLINIC_OR_DEPARTMENT_OTHER): Payer: Medicaid Other | Admitting: Internal Medicine

## 2012-07-13 ENCOUNTER — Other Ambulatory Visit: Payer: Self-pay | Admitting: *Deleted

## 2012-07-13 ENCOUNTER — Telehealth: Payer: Self-pay | Admitting: Internal Medicine

## 2012-07-13 ENCOUNTER — Other Ambulatory Visit (HOSPITAL_BASED_OUTPATIENT_CLINIC_OR_DEPARTMENT_OTHER): Payer: Medicaid Other

## 2012-07-13 ENCOUNTER — Encounter: Payer: Self-pay | Admitting: Internal Medicine

## 2012-07-13 DIAGNOSIS — C3430 Malignant neoplasm of lower lobe, unspecified bronchus or lung: Secondary | ICD-10-CM

## 2012-07-13 DIAGNOSIS — C7970 Secondary malignant neoplasm of unspecified adrenal gland: Secondary | ICD-10-CM

## 2012-07-13 DIAGNOSIS — C786 Secondary malignant neoplasm of retroperitoneum and peritoneum: Secondary | ICD-10-CM

## 2012-07-13 DIAGNOSIS — C7951 Secondary malignant neoplasm of bone: Secondary | ICD-10-CM

## 2012-07-13 DIAGNOSIS — C7952 Secondary malignant neoplasm of bone marrow: Secondary | ICD-10-CM

## 2012-07-13 LAB — CBC WITH DIFFERENTIAL/PLATELET
Basophils Absolute: 0 10*3/uL (ref 0.0–0.1)
EOS%: 0.8 % (ref 0.0–7.0)
Eosinophils Absolute: 0.1 10*3/uL (ref 0.0–0.5)
HCT: 46.1 % (ref 38.4–49.9)
HGB: 15.4 g/dL (ref 13.0–17.1)
MCH: 31.5 pg (ref 27.2–33.4)
MCV: 94.5 fL (ref 79.3–98.0)
MONO%: 7.7 % (ref 0.0–14.0)
NEUT#: 12.8 10*3/uL — ABNORMAL HIGH (ref 1.5–6.5)
NEUT%: 77.7 % — ABNORMAL HIGH (ref 39.0–75.0)
RDW: 15.2 % — ABNORMAL HIGH (ref 11.0–14.6)
lymph#: 2.2 10*3/uL (ref 0.9–3.3)

## 2012-07-13 LAB — COMPREHENSIVE METABOLIC PANEL (CC13)
Albumin: 3.1 g/dL — ABNORMAL LOW (ref 3.5–5.0)
BUN: 22.4 mg/dL (ref 7.0–26.0)
Calcium: 9.2 mg/dL (ref 8.4–10.4)
Chloride: 102 mEq/L (ref 98–107)
Creatinine: 1 mg/dL (ref 0.7–1.3)
Glucose: 160 mg/dl — ABNORMAL HIGH (ref 70–99)
Potassium: 4.5 mEq/L (ref 3.5–5.1)

## 2012-07-13 MED ORDER — ALPRAZOLAM 0.25 MG PO TABS: 0.2500 mg | ORAL_TABLET | Freq: Three times a day (TID) | ORAL | Status: DC | PRN

## 2012-07-13 MED ORDER — PROCHLORPERAZINE MALEATE 10 MG PO TABS: 10.0000 mg | ORAL_TABLET | Freq: Four times a day (QID) | ORAL | Status: DC | PRN

## 2012-07-13 NOTE — Patient Instructions (Signed)
We had a lengthy discussion today about your prognosis and treatment options. Start the first cycle of chemotherapy with carboplatin, paclitaxel and Avastin tomorrow. Followup visit in one week

## 2012-07-13 NOTE — Progress Notes (Signed)
[facility] [phone]   [phone]  OFFICE PROGRESS NOTE  No PCP Per Patient [address] [place] [address]  DIAGNOSIS: Metastatic non-small cell lung cancer, initially diagnosed as Stage IIB/IIIA, adenocarcinoma with a large left perihilar mass and questionable mediastinal lymphadenopathy diagnosed in July of 2013   PRIOR THERAPY:  1) Neoadjuvant chemotherapy with cisplatin 75 mg/M2 and Alimta 500 mg/M2 given every 3 weeks. The patient is status post 3 cycles.  2) Concurrent chemoradiation with weekly carboplatin for AUC of 2 and paclitaxel 45 mg/M2, last dose was given 03/30/2012 with partial response.   CURRENT THERAPY: Systemic chemotherapy with carboplatin for AUC of 5, paclitaxel 175 mg/M2 and Avastin 15 mg/kg with Neulasta support every 3 weeks. The patient was started for a cycle today.  INTERVAL HISTORY: Roger Mckinney 52 y.o. male returns to the clinic today for followup visit accompanied by his wife. The patient has several complaints today and has been very tangential with his thoughts which he attributed to the steroid treatment. He is feeling fine on steroids and he would like to continue on it but he has a lot of anxiety issues. He is still concerned about starting chemotherapy tomorrow. He is a question about his prognosis and goals of care. He has significant improvement in his cough after starting Decadron but he discontinued the treatment with doxycycline secondary to intolerance. He still on OxyContin 10 mg by mouth every 12 hours in addition to Hycodan 10 mg by mouth every 6 hours. He also has prescription for Diflucan but he did not use it. He denied having any significant weight loss or night sweats. He denied having any chest pain but continues to have shortness of breath with exertion and dry cough. No hemoptysis.  MEDICAL HISTORY: Past Medical History  Diagnosis Date  . PNA (pneumonia)   . Lung mass   . Mitral valve  prolapse   . Asthma     as a child  . GERD (gastroesophageal reflux disease)     hx of   . HPV (human papilloma virus) infection     hx of  . Cancer 2013  . Dental disease 2013  September    lower teeth removed.   bone graft   . History of chemotherapy     carboplatin/taxol    ALLERGIES:  is allergic to hydrocodone-homatropine and penicillins.  MEDICATIONS:  Current Outpatient Prescriptions  Medication Sig Dispense Refill  . benzonatate (TESSALON) 200 MG capsule Take 1 capsule (200 mg total) by mouth 3 (three) times daily as needed. For cough  20 capsule  0  . dexamethasone (DECADRON) 4 MG tablet Take 1 tablet (4 mg total) by mouth 2 (two) times daily with a meal.  60 tablet  0  . doxycycline (VIBRA-TABS) 100 MG tablet Take 1 tablet (100 mg total) by mouth 2 (two) times daily.  28 tablet  0  . fluconazole (DIFLUCAN) 200 MG tablet Take 1 tablet (200 mg total) by mouth daily.  10 tablet  0  . HYDROmorphone (DILAUDID) 4 MG tablet Take 1 tablet (4 mg total) by mouth every 4 (four) hours as needed for pain.  30 tablet  0  . oxyCODONE (OXYCONTIN) 10 MG 12 hr tablet Take 1 tablet (10 mg total) by mouth every 12 (twelve) hours.  60 tablet  0  . Oxycodone HCl 10 MG TABS Take 1 tablet by mouth every 6 hours as needed for pain  60 tablet  0   No current facility-administered medications for this visit.   Facility-Administered Medications Ordered in Other Visits  Medication Dose Route Frequency Provider Last Rate Last Dose  . topical emolient (BIAFINE) emulsion   Topical Daily Billie Lade, MD        SURGICAL HISTORY:  Past Surgical History  Procedure Laterality Date  . Appendectomy    . Tonsillectomy    . Video bronchoscopy with endobronchial ultrasound  01/14/2012    Procedure: VIDEO BRONCHOSCOPY WITH ENDOBRONCHIAL ULTRASOUND;  Surgeon: Delight Ovens, MD;  Location: [facility] OR;  Service: Thoracic;  Laterality: N/A;  . Mediastinoscopy  01/14/2012    Procedure: MEDIASTINOSCOPY;  Surgeon:  Delight Ovens, MD;  Location: [facility] OR;  Service: Thoracic;  Laterality: N/A;    REVIEW OF SYSTEMS:  A comprehensive review of systems was negative except for: Constitutional: positive for fatigue Respiratory: positive for cough, dyspnea on exertion and wheezing Behavioral/Psych: positive for anxiety and sleep disturbance   PHYSICAL EXAMINATION: General appearance: alert, cooperative, fatigued and no distress Head: Normocephalic, without obvious abnormality, atraumatic Neck: no adenopathy Lymph nodes: Cervical, supraclavicular, and axillary nodes normal. Resp: wheezes bilaterally Cardio: regular rate and rhythm, S1, S2 normal, no murmur, click, rub or gallop GI: soft, non-tender; bowel sounds normal; no masses,  no organomegaly Extremities: extremities normal, atraumatic, no cyanosis or edema Neurologic: Alert and oriented X 3, normal strength and tone. Normal symmetric reflexes. Normal coordination and gait  ECOG PERFORMANCE STATUS: 1 - Symptomatic but completely ambulatory  Blood pressure 149/91, pulse 92, temperature 98.1 F (36.7 C), temperature source Oral, resp. rate 20, height 5\' 10"  (1.778 m), weight 203 lb 6.4 oz (92.262 kg).  LABORATORY DATA: Lab Results  Component Value Date   WBC 16.4* 07/13/2012   HGB 15.4 07/13/2012   HCT 46.1 07/13/2012   MCV 94.5 07/13/2012   PLT 204 07/13/2012      Chemistry      Component Value Date/Time   NA 135 06/14/2012 2012   NA 139 05/11/2012 0854   K 4.2 06/14/2012 2012   K 3.9 05/11/2012 0854   CL 99 06/14/2012 2012   CL 106 05/11/2012 0854   CO2 24 06/14/2012 2012   CO2 24 05/11/2012 0854   BUN 15 06/14/2012 2012   BUN 12.9 05/11/2012 0854   CREATININE 0.95 06/14/2012 2012   CREATININE 1.1 05/11/2012 0854      Component Value Date/Time   CALCIUM 9.6 06/14/2012 2012   CALCIUM 10.1 05/11/2012 0854   ALKPHOS 92 05/11/2012 0854   ALKPHOS 111 01/13/2012 0849   AST 15 05/11/2012 0854   AST 17 01/13/2012 0849   ALT 14 05/11/2012 0854   ALT 9 01/13/2012 0849   BILITOT  0.51 05/11/2012 0854   BILITOT 0.6 01/13/2012 0849       RADIOGRAPHIC STUDIES: Dg Ribs Unilateral Left  06/14/2012  *RADIOLOGY REPORT*  Clinical Data: Left-sided rib pain and cough.  History of lung cancer, on chemotherapy and radiation therapy.  LEFT RIBS - 2 VIEW  Comparison: Chest radiograph performed earlier today at 08:22 p.m.  Findings: There may be slight focal expansion of the left posterolateral ninth rib, which could reflect a metastatic lesion, or possibly remote injury, not present on the CT from 05/08/2012.  Vascular congestion is again noted; the patient's left perihilar mass is again seen.  No pleural effusion or pneumothorax is identified at the left lung.  The mediastinum is grossly unremarkable in appearance.  IMPRESSION:  1.  Possible slight focal  expansion of the left posterolateral ninth rib, which could reflect a metastatic lesion, or possibly sequelae of remote injury, though not present on the CT from 05/08/2012.  No definite rib fractures seen. 2.  Vascular congestion again noted; left perihilar mass again seen.   Original Report Authenticated By: Tonia Ghent, M.D.    Ct Chest W Contrast  06/14/2012  *RADIOLOGY REPORT*  Clinical Data: Left lower rib pain.  Lung cancer.  CT CHEST WITH CONTRAST  Technique:  Multidetector CT imaging of the chest was performed following the standard protocol during bolus administration of intravenous contrast.  Contrast: OMNIPAQUE IOHEXOL 300 MG/ML  SOLN  Comparison: One-view chest and left rib radiographs 06/14/2012.  Findings: Left perihilar mass is increased in size, now measuring 4.0 x 5.0 centimeters.  Left pleural thickening and effusion has increased.  Additional surrounding airspace disease is evident. There is progression of ill-defined opacity in the lingula, measuring 4.0 x 2.2 cm on the axial images.  Areas of dependent atelectasis are more prominent bilaterally.  Remote nonhealed right eighth and ninth rib fractures are again seen.  No  significant osseous abnormalities are present on the left.  No significant mediastinal or axillary adenopathy is present.  The thoracic inlet is within normal limits.  A left adrenal mass has increased in size, now measuring 17 x 21 mm.  Limited imaging of the upper abdomen is otherwise unremarkable.  IMPRESSION:  1.  Increased size of the left perihilar mass compatible with progression of disease. 2.  Additional airspace opacities surrounding this may represent postobstructive disease versus radiation change. 3. Increased pleural disease and small left effusion are also worrisome for progression of disease. 4.  New ill-defined opacities within the lingula are worrisome for additional metastases.  A typical infection is also considered. 5.  No focal abnormalities of the left ribs. 6.  Nonhealed right eighth and ninth rib fractures. 7.  Increasing size of a left adrenal lesion and likely metastasis.   Original Report Authenticated By: Marin Roberts, M.D.    Nm Pet Image Restag (ps) Skull Base To Thigh  06/26/2012  *RADIOLOGY REPORT*  Clinical Data: Subsequent treatment strategy for lung cancer.  NUCLEAR MEDICINE PET SKULL BASE TO THIGH  Fasting Blood Glucose:  108  Technique:  19.6 mCi F-18 FDG was injected intravenously. CT data was obtained and used for attenuation correction and anatomic localization only.  (This was not acquired as a diagnostic CT examination.) Additional exam technical data entered on technologist worksheet.  Comparison:  01/01/2012.  Findings:  Neck: No hypermetabolic lymph nodes in the neck. Persistent metabolically active left parotid gland lesion.  No change in size.  Chest:  The large left infrahilar mass is slightly smaller.  It measures 4.6 x 3.3 cm and previously measured 5.8 x 3.6 cm SUV max has decreased from 21.9 213.3.  No hypermetabolic mediastinal or hilar lymph nodes.  There is new surrounding radiation pneumonitis with FDG uptake.  Abdomen/Pelvis:  There is a new  hypermetabolic left adrenal gland mass consistent with metastasis. There is also a new soft tissue mass near the inferior tip of the liver on image number 170 which shows FDG uptake consistent with an omental implant.  It measures a maximum of 15.4 mm and SUV Max is 13.8.  No definite liver metastasis.  Skeleton:  There is a area of increased FDG uptake in the left acetabulum laterally which is worrisome for a osseous metastasis. I do not see any obvious abnormality on the CT scan  but a cortical mass is likely.  No other obvious bone lesions.  IMPRESSION:  1.  Slight interval decrease in size of the left lung mass and decrease in SUV max as described above. 2.  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 3.  New left adrenal gland metastasis. 4.  New omental metastatic lesion. 5.  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   Original Report Authenticated By: Rudie Meyer, M.D.    Dg Chest Port 1 View  06/14/2012  *RADIOLOGY REPORT*  Clinical Data: Chest pain.  PORTABLE CHEST - 1 VIEW  Comparison: CT chest 05/08/2012.  Two-view chest x-ray 01/13/2012.  Findings: A left perihilar mass is again noted.  Mild pulmonary vascular congestion is now present bilaterally.  Remote right-sided rib fractures are again seen.  IMPRESSION:  1.  New pulmonary vascular congestion. 2.  Persistent left perihilar mass. 3.  Remote right-sided rib fractures.   Original Report Authenticated By: Marin Roberts, M.D.    Dg Knee Complete 4 Views Left  06/14/2012  *RADIOLOGY REPORT*  Clinical Data: Left knee pain and swelling.  LEFT KNEE - COMPLETE 4+ VIEW  Comparison: None.  Findings: There is no evidence of fracture or dislocation.  The joint spaces are preserved.  Minimal osteophyte formation is noted at the tibial spine; the patellofemoral joint is grossly unremarkable in appearance.  A small sclerotic focus at the proximal tibia has a benign appearance.  Trace knee joint fluid remains within normal limits.  The  visualized soft tissues are normal in appearance.  IMPRESSION: No evidence of fracture or dislocation.   Original Report Authenticated By: Tonia Ghent, M.D.     ASSESSMENT: This is a very pleasant 52 years old white male with metastatic non-small cell lung cancer, adenocarcinoma initially diagnosed as stage IIIa status post neoadjuvant chemotherapy with carboplatin and Alimta followed by concurrent chemoradiation but unfortunately the patient has evidence for metastatic disease in the left adrenal gland as well as omental metastatic lesion and bone lesions involving the left acetabulum.  PLAN: I have a lengthy discussion with the patient and his wife today again about his disease stage, prognosis and treatment options. I reminded the patient that the upcoming chemotherapy was carboplatin, paclitaxel and Avastin is mainly for palliation of his disease and probably increasing his survival. I discussed with the patient again the adverse effect of this chemotherapy including alopecia, nausea vomiting, peripheral neuropathy, liver or in dysfunction as well as pulmonary hemorrhage, GI perforation and wound healing delay from Avastin. The patient is expected to start the first cycle of this treatment tomorrow. He has family coming to town in 3 weeks and he would like to delay the start of cycle #2 by 1 week.  He would come back for followup visit in one week to see my physician assistant for reevaluation and management any adverse effect of his chemotherapy. For pain management, I advise him to increase the dose of OxyContin to 20 mg by mouth every 12 hours in addition to Dilaudid 4 mg by mouth every 6 hours as needed. He was also advised to start tapering Decadron dose to 4 mg by mouth every morning and 2 mg by mouth every afternoon for 1 week followed by 2 mg by mouth twice a day for one week followed by 2 mg by mouth daily for 1 week, then 2 mg every other day for one week and then stop I would see the  patient back for followup visit in 4 weeks with the second  cycle of his chemotherapy. He was advised to call immediately if he has any concerning symptoms in the interval. All questions were answered. The patient knows to call the clinic with any problems, questions or concerns. We can certainly see the patient much sooner if necessary.  I spent 20 minutes counseling the patient face to face. The total time spent in the appointment was 30 minutes.

## 2012-07-14 ENCOUNTER — Ambulatory Visit: Payer: Self-pay | Admitting: Internal Medicine

## 2012-07-14 ENCOUNTER — Ambulatory Visit (HOSPITAL_BASED_OUTPATIENT_CLINIC_OR_DEPARTMENT_OTHER): Payer: PRIVATE HEALTH INSURANCE

## 2012-07-14 ENCOUNTER — Other Ambulatory Visit: Payer: Self-pay | Admitting: Lab

## 2012-07-14 DIAGNOSIS — Z5111 Encounter for antineoplastic chemotherapy: Secondary | ICD-10-CM

## 2012-07-14 DIAGNOSIS — Z5112 Encounter for antineoplastic immunotherapy: Secondary | ICD-10-CM

## 2012-07-14 DIAGNOSIS — C3430 Malignant neoplasm of lower lobe, unspecified bronchus or lung: Secondary | ICD-10-CM

## 2012-07-14 MED ORDER — FAMOTIDINE IN NACL 20-0.9 MG/50ML-% IV SOLN
20.0000 mg | Freq: Once | INTRAVENOUS | Status: AC
  Administered 2012-07-14: 20 mg via INTRAVENOUS

## 2012-07-14 MED ORDER — SODIUM CHLORIDE 0.9 % IV SOLN
15.0000 mg/kg | Freq: Once | INTRAVENOUS | Status: AC
  Administered 2012-07-14: 1375 mg via INTRAVENOUS
  Filled 2012-07-14: qty 55

## 2012-07-14 MED ORDER — CARBOPLATIN CHEMO INJECTION 600 MG/60ML
687.0000 mg | Freq: Once | INTRAVENOUS | Status: AC
  Administered 2012-07-14: 690 mg via INTRAVENOUS
  Filled 2012-07-14: qty 69

## 2012-07-14 MED ORDER — DEXAMETHASONE SODIUM PHOSPHATE 20 MG/5ML IJ SOLN
20.0000 mg | Freq: Once | INTRAMUSCULAR | Status: AC
  Administered 2012-07-14: 20 mg via INTRAVENOUS

## 2012-07-14 MED ORDER — PACLITAXEL CHEMO INJECTION 300 MG/50ML
175.0000 mg/m2 | Freq: Once | INTRAVENOUS | Status: AC
  Administered 2012-07-14: 372 mg via INTRAVENOUS
  Filled 2012-07-14: qty 62

## 2012-07-14 MED ORDER — PROCHLORPERAZINE MALEATE 10 MG PO TABS: 10.0000 mg | ORAL_TABLET | Freq: Four times a day (QID) | ORAL | Status: DC | PRN

## 2012-07-14 MED ORDER — SODIUM CHLORIDE 0.9 % IV SOLN
Freq: Once | INTRAVENOUS | Status: AC
  Administered 2012-07-14: 09:00:00 via INTRAVENOUS

## 2012-07-14 MED ORDER — DIPHENHYDRAMINE HCL 50 MG/ML IJ SOLN
50.0000 mg | Freq: Once | INTRAMUSCULAR | Status: AC
  Administered 2012-07-14: 50 mg via INTRAVENOUS

## 2012-07-14 MED ORDER — ONDANSETRON 16 MG/50ML IVPB (CHCC)
16.0000 mg | Freq: Once | INTRAVENOUS | Status: AC
  Administered 2012-07-14: 16 mg via INTRAVENOUS

## 2012-07-14 NOTE — Patient Instructions (Addendum)
[facility] Discharge Instructions for Patients Receiving Chemotherapy  Today you received the following chemotherapy agents ; Taxol, Carboplatin and Avastin.  To help prevent nausea and vomiting after your treatment, we encourage you to take your nausea medication as directed.   If you develop nausea and vomiting that is not controlled by your nausea medication, call the clinic. If it is after clinic hours your family physician or the after hours number for the clinic or go to the Emergency Department.   BELOW ARE SYMPTOMS THAT SHOULD BE REPORTED IMMEDIATELY:  *FEVER GREATER THAN 100.5 F  *CHILLS WITH OR WITHOUT FEVER  NAUSEA AND VOMITING THAT IS NOT CONTROLLED WITH YOUR NAUSEA MEDICATION  *UNUSUAL SHORTNESS OF BREATH  *UNUSUAL BRUISING OR BLEEDING  TENDERNESS IN MOUTH AND THROAT WITH OR WITHOUT PRESENCE OF ULCERS  *URINARY PROBLEMS  *BOWEL PROBLEMS  UNUSUAL RASH Items with * indicate a potential emergency and should be followed up as soon as possible.  One of the nurses will contact you 24 hours after your treatment. Please let the nurse know about any problems that you may have experienced. Feel free to call the clinic you have any questions or concerns. The clinic phone number is [phone].   I have been informed and understand all the instructions given to me. I know to contact the clinic, my physician, or go to the Emergency Department if any problems should occur. I do not have any questions at this time, but understand that I may call the clinic during office hours   should I have any questions or need assistance in obtaining follow up care.    __________________________________________  _____________  __________ Signature of Patient or Authorized Representative            Date                   Time    __________________________________________ Nurse's Signature

## 2012-07-15 ENCOUNTER — Telehealth: Payer: Self-pay | Admitting: *Deleted

## 2012-07-15 ENCOUNTER — Other Ambulatory Visit: Payer: Self-pay | Admitting: Certified Registered Nurse Anesthetist

## 2012-07-15 ENCOUNTER — Ambulatory Visit (HOSPITAL_BASED_OUTPATIENT_CLINIC_OR_DEPARTMENT_OTHER): Payer: Medicaid Other

## 2012-07-15 DIAGNOSIS — C3430 Malignant neoplasm of lower lobe, unspecified bronchus or lung: Secondary | ICD-10-CM

## 2012-07-15 MED ORDER — PEGFILGRASTIM INJECTION 6 MG/0.6ML
6.0000 mg | Freq: Once | SUBCUTANEOUS | Status: AC
  Administered 2012-07-15: 6 mg via SUBCUTANEOUS
  Filled 2012-07-15: qty 0.6

## 2012-07-15 NOTE — Telephone Encounter (Signed)
Rick here for Neulasta injection following 1st taxol/carbo/avastin chemo treatment.  States he is doing pretty good except for not sleeping.  Did get an order for Xanax which has helped him sleep.  He is having a difficult time staying awake at this time.  His wife is with him today.  He is drinking and eating without problems.  They know to call the office if he has any problems or questions.

## 2012-07-15 NOTE — Patient Instructions (Addendum)

## 2012-07-20 ENCOUNTER — Ambulatory Visit (HOSPITAL_BASED_OUTPATIENT_CLINIC_OR_DEPARTMENT_OTHER): Payer: Medicaid Other | Admitting: Physician Assistant

## 2012-07-20 ENCOUNTER — Encounter: Payer: Self-pay | Admitting: Physician Assistant

## 2012-07-20 ENCOUNTER — Telehealth: Payer: Self-pay | Admitting: Internal Medicine

## 2012-07-20 ENCOUNTER — Other Ambulatory Visit (HOSPITAL_BASED_OUTPATIENT_CLINIC_OR_DEPARTMENT_OTHER): Payer: Medicaid Other

## 2012-07-20 ENCOUNTER — Telehealth: Payer: Self-pay | Admitting: *Deleted

## 2012-07-20 DIAGNOSIS — C7952 Secondary malignant neoplasm of bone marrow: Secondary | ICD-10-CM

## 2012-07-20 DIAGNOSIS — C3430 Malignant neoplasm of lower lobe, unspecified bronchus or lung: Secondary | ICD-10-CM

## 2012-07-20 DIAGNOSIS — C7970 Secondary malignant neoplasm of unspecified adrenal gland: Secondary | ICD-10-CM

## 2012-07-20 DIAGNOSIS — C7951 Secondary malignant neoplasm of bone: Secondary | ICD-10-CM

## 2012-07-20 DIAGNOSIS — R0602 Shortness of breath: Secondary | ICD-10-CM

## 2012-07-20 LAB — COMPREHENSIVE METABOLIC PANEL (CC13)
AST: 19 U/L (ref 5–34)
Albumin: 2.9 g/dL — ABNORMAL LOW (ref 3.5–5.0)
BUN: 21.4 mg/dL (ref 7.0–26.0)
CO2: 22 mEq/L (ref 22–29)
Calcium: 8.8 mg/dL (ref 8.4–10.4)
Chloride: 103 mEq/L (ref 98–107)
Glucose: 102 mg/dl — ABNORMAL HIGH (ref 70–99)
Potassium: 4.6 mEq/L (ref 3.5–5.1)

## 2012-07-20 LAB — CBC WITH DIFFERENTIAL/PLATELET
Basophils Absolute: 0 10*3/uL (ref 0.0–0.1)
EOS%: 3.6 % (ref 0.0–7.0)
Eosinophils Absolute: 0.2 10*3/uL (ref 0.0–0.5)
HCT: 43.9 % (ref 38.4–49.9)
HGB: 15 g/dL (ref 13.0–17.1)
MONO#: 0.4 10*3/uL (ref 0.1–0.9)
NEUT#: 3.3 10*3/uL (ref 1.5–6.5)
NEUT%: 66.4 % (ref 39.0–75.0)
RDW: 15.3 % — ABNORMAL HIGH (ref 11.0–14.6)
lymph#: 1.1 10*3/uL (ref 0.9–3.3)

## 2012-07-20 MED ORDER — OXYCODONE HCL 20 MG PO TB12: 20.0000 mg | ORAL_TABLET | Freq: Two times a day (BID) | ORAL | Status: DC

## 2012-07-20 MED ORDER — BENZONATATE 200 MG PO CAPS: 200.0000 mg | ORAL_CAPSULE | Freq: Three times a day (TID) | ORAL | Status: DC | PRN

## 2012-07-20 MED ORDER — HYDROMORPHONE HCL 4 MG PO TABS: 4.0000 mg | ORAL_TABLET | ORAL | Status: DC | PRN

## 2012-07-20 NOTE — Patient Instructions (Addendum)
Your OxyContin has been increased to 20 mg tablets. Take one tablet by mouth every 12 hours for pain management Continue weekly labs as scheduled Followup with Dr. Arbutus Ped in 3 weeks prior to your next scheduled cycle of chemotherapy

## 2012-07-20 NOTE — Progress Notes (Signed)
[facility] [phone]   Fax:([phone]  OFFICE PROGRESS NOTE  No PCP Per Patient [address] [place] [address]  DIAGNOSIS: Metastatic non-small cell lung cancer, initially diagnosed as Stage IIB/IIIA, adenocarcinoma with a large left perihilar mass and questionable mediastinal lymphadenopathy diagnosed in July of 2013   PRIOR THERAPY:  1) Neoadjuvant chemotherapy with cisplatin 75 mg/M2 and Alimta 500 mg/M2 given every 3 weeks. The patient is status post 3 cycles.  2) Concurrent chemoradiation with weekly carboplatin for AUC of 2 and paclitaxel 45 mg/M2, last dose was given 03/30/2012 with partial response.   CURRENT THERAPY: Systemic chemotherapy with carboplatin for AUC of 5, paclitaxel 175 mg/M2 and Avastin 15 mg/kg with Neulasta support every 3 weeks. Status post 1 cycle  INTERVAL HISTORY: Roger Mckinney 52 y.o. male returns to the clinic today for followup visit accompanied by his wife. Patient reports tolerating his first cycle of systemic chemotherapy with carboplatin, paclitaxel and Avastin relatively well. He did have some side effects but states that they were not as severe as on his previous chemotherapy. He did have some bad heartburn but this was well managed with over-the-counter Pepcid. He generally feels weak and is having difficulty with insomnia. He has tapered and discontinued his steroids in the insomnia is gotten some better. He did have some bony aches from the Neulasta injection particularly about the ankles with some mild soft tissue swelling. He denied any bleeding or bruising. His appetite is good. He is eating an average of 2 meals per day as well as ingesting 4-6 Ensure supplements daily. He requests a refill for his Dilaudid, Tessalon Perles as well as a new prescription for the increased dose of OxyContin at 20 mg by mouth every 12 hours. He had thrush but has not completed his course of Diflucan. He did admit to taking it  for a few days and began to see resolution of the thrush of discontinued. He denied having any significant weight loss or night sweats. He denied having any chest pain but continues to have shortness of breath with exertion and dry cough. No hemoptysis.  MEDICAL HISTORY: Past Medical History  Diagnosis Date  . PNA (pneumonia)   . Lung mass   . Mitral valve prolapse   . Asthma     as a child  . GERD (gastroesophageal reflux disease)     hx of   . HPV (human papilloma virus) infection     hx of  . Cancer 2013  . Dental disease 2013  September    lower teeth removed.   bone graft   . History of chemotherapy     carboplatin/taxol    ALLERGIES:  is allergic to hydrocodone-homatropine and penicillins.  MEDICATIONS:  Current Outpatient Prescriptions  Medication Sig Dispense Refill  . ALPRAZolam (XANAX) 0.25 MG tablet Take 1 tablet (0.25 mg total) by mouth 3 (three) times daily as needed for sleep.  60 tablet  0  . benzonatate (TESSALON) 200 MG capsule Take 1 capsule (200 mg total) by mouth 3 (three) times daily as needed. For cough  30 capsule  1  . dexamethasone (DECADRON) 4 MG tablet Take 1 tablet (4 mg total) by mouth 2 (two) times daily with a meal.  60 tablet  0  . doxycycline (VIBRA-TABS) 100 MG tablet Take 1 tablet (100 mg total) by mouth 2 (two) times daily.  28 tablet  0  . fluconazole (DIFLUCAN) 200 MG tablet Take  1 tablet (200 mg total) by mouth daily.  10 tablet  0  . HYDROmorphone (DILAUDID) 4 MG tablet Take 1 tablet (4 mg total) by mouth every 4 (four) hours as needed for pain.  30 tablet  0  . oxyCODONE (OXYCONTIN) 20 MG 12 hr tablet Take 1 tablet (20 mg total) by mouth every 12 (twelve) hours.  60 tablet  0  . Oxycodone HCl 10 MG TABS Take 1 tablet by mouth every 6 hours as needed for pain  60 tablet  0  . prochlorperazine (COMPAZINE) 10 MG tablet Take 1 tablet (10 mg total) by mouth every 6 (six) hours as needed.  30 tablet  2   No current facility-administered  medications for this visit.   Facility-Administered Medications Ordered in Other Visits  Medication Dose Route Frequency Provider Last Rate Last Dose  . topical emolient (BIAFINE) emulsion   Topical Daily Billie Lade, MD        SURGICAL HISTORY:  Past Surgical History  Procedure Laterality Date  . Appendectomy    . Tonsillectomy    . Video bronchoscopy with endobronchial ultrasound  01/14/2012    Procedure: VIDEO BRONCHOSCOPY WITH ENDOBRONCHIAL ULTRASOUND;  Surgeon: Delight Ovens, MD;  Location: [facility] OR;  Service: Thoracic;  Laterality: N/A;  . Mediastinoscopy  01/14/2012    Procedure: MEDIASTINOSCOPY;  Surgeon: Delight Ovens, MD;  Location: [facility] OR;  Service: Thoracic;  Laterality: N/A;    REVIEW OF SYSTEMS:  A comprehensive review of systems was negative except for: Constitutional: positive for fatigue Respiratory: positive for cough and dyspnea on exertion Musculoskeletal: positive for bone pain Behavioral/Psych: positive for anxiety and sleep disturbance   PHYSICAL EXAMINATION: General appearance: alert, cooperative, fatigued and no distress Head: Normocephalic, without obvious abnormality, atraumatic Neck: no adenopathy Lymph nodes: Cervical, supraclavicular, and axillary nodes normal. Resp: wheezes bilaterally Cardio: regular rate and rhythm, S1, S2 normal, no murmur, click, rub or gallop GI: soft, non-tender; bowel sounds normal; no masses,  no organomegaly Extremities: extremities normal, atraumatic, no cyanosis or edema Neurologic: Alert and oriented X 3, normal strength and tone. Normal symmetric reflexes. Normal coordination and gait Mouth: Reveals thrush  ECOG PERFORMANCE STATUS: 1 - Symptomatic but completely ambulatory  Blood pressure 125/80, pulse 102, temperature 97.5 F (36.4 C), temperature source Oral, resp. rate 19, height 5\' 10"  (1.778 m), weight 201 lb (91.173 kg).  LABORATORY DATA: Lab Results  Component Value Date   WBC 5.0 07/20/2012   HGB 15.0  07/20/2012   HCT 43.9 07/20/2012   MCV 95.0 07/20/2012   PLT 76* 07/20/2012      Chemistry      Component Value Date/Time   NA 134* 07/20/2012 0924   NA 135 06/14/2012 2012   K 4.6 07/20/2012 0924   K 4.2 06/14/2012 2012   CL 103 07/20/2012 0924   CL 99 06/14/2012 2012   CO2 22 07/20/2012 0924   CO2 24 06/14/2012 2012   BUN 21.4 07/20/2012 0924   BUN 15 06/14/2012 2012   CREATININE 0.8 07/20/2012 0924   CREATININE 0.95 06/14/2012 2012      Component Value Date/Time   CALCIUM 8.8 07/20/2012 0924   CALCIUM 9.6 06/14/2012 2012   ALKPHOS 125 07/20/2012 0924   ALKPHOS 111 01/13/2012 0849   AST 19 07/20/2012 0924   AST 17 01/13/2012 0849   ALT 30 07/20/2012 0924   ALT 9 01/13/2012 0849   BILITOT 0.82 07/20/2012 0924   BILITOT 0.6 01/13/2012 0849  RADIOGRAPHIC STUDIES: Dg Ribs Unilateral Left  06/14/2012  *RADIOLOGY REPORT*  Clinical Data: Left-sided rib pain and cough.  History of lung cancer, on chemotherapy and radiation therapy.  LEFT RIBS - 2 VIEW  Comparison: Chest radiograph performed earlier today at 08:22 p.m.  Findings: There may be slight focal expansion of the left posterolateral ninth rib, which could reflect a metastatic lesion, or possibly remote injury, not present on the CT from 05/08/2012.  Vascular congestion is again noted; the patient's left perihilar mass is again seen.  No pleural effusion or pneumothorax is identified at the left lung.  The mediastinum is grossly unremarkable in appearance.  IMPRESSION:  1.  Possible slight focal expansion of the left posterolateral ninth rib, which could reflect a metastatic lesion, or possibly sequelae of remote injury, though not present on the CT from 05/08/2012.  No definite rib fractures seen. 2.  Vascular congestion again noted; left perihilar mass again seen.   Original Report Authenticated By: Tonia Ghent, M.D.    Ct Chest W Contrast  06/14/2012  *RADIOLOGY REPORT*  Clinical Data: Left lower rib pain.  Lung cancer.  CT CHEST WITH CONTRAST   Technique:  Multidetector CT imaging of the chest was performed following the standard protocol during bolus administration of intravenous contrast.  Contrast: OMNIPAQUE IOHEXOL 300 MG/ML  SOLN  Comparison: One-view chest and left rib radiographs 06/14/2012.  Findings: Left perihilar mass is increased in size, now measuring 4.0 x 5.0 centimeters.  Left pleural thickening and effusion has increased.  Additional surrounding airspace disease is evident. There is progression of ill-defined opacity in the lingula, measuring 4.0 x 2.2 cm on the axial images.  Areas of dependent atelectasis are more prominent bilaterally.  Remote nonhealed right eighth and ninth rib fractures are again seen.  No significant osseous abnormalities are present on the left.  No significant mediastinal or axillary adenopathy is present.  The thoracic inlet is within normal limits.  A left adrenal mass has increased in size, now measuring 17 x 21 mm.  Limited imaging of the upper abdomen is otherwise unremarkable.  IMPRESSION:  1.  Increased size of the left perihilar mass compatible with progression of disease. 2.  Additional airspace opacities surrounding this may represent postobstructive disease versus radiation change. 3. Increased pleural disease and small left effusion are also worrisome for progression of disease. 4.  New ill-defined opacities within the lingula are worrisome for additional metastases.  A typical infection is also considered. 5.  No focal abnormalities of the left ribs. 6.  Nonhealed right eighth and ninth rib fractures. 7.  Increasing size of a left adrenal lesion and likely metastasis.   Original Report Authenticated By: Marin Roberts, M.D.    Nm Pet Image Restag (ps) Skull Base To Thigh  06/26/2012  *RADIOLOGY REPORT*  Clinical Data: Subsequent treatment strategy for lung cancer.  NUCLEAR MEDICINE PET SKULL BASE TO THIGH  Fasting Blood Glucose:  108  Technique:  19.6 mCi F-18 FDG was injected  intravenously. CT data was obtained and used for attenuation correction and anatomic localization only.  (This was not acquired as a diagnostic CT examination.) Additional exam technical data entered on technologist worksheet.  Comparison:  01/01/2012.  Findings:  Neck: No hypermetabolic lymph nodes in the neck. Persistent metabolically active left parotid gland lesion.  No change in size.  Chest:  The large left infrahilar mass is slightly smaller.  It measures 4.6 x 3.3 cm and previously measured 5.8 x 3.6 cm SUV max  has decreased from 21.9 213.3.  No hypermetabolic mediastinal or hilar lymph nodes.  There is new surrounding radiation pneumonitis with FDG uptake.  Abdomen/Pelvis:  There is a new hypermetabolic left adrenal gland mass consistent with metastasis. There is also a new soft tissue mass near the inferior tip of the liver on image number 170 which shows FDG uptake consistent with an omental implant.  It measures a maximum of 15.4 mm and SUV Max is 13.8.  No definite liver metastasis.  Skeleton:  There is a area of increased FDG uptake in the left acetabulum laterally which is worrisome for a osseous metastasis. I do not see any obvious abnormality on the CT scan but a cortical mass is likely.  No other obvious bone lesions.  IMPRESSION:  1.  Slight interval decrease in size of the left lung mass and decrease in SUV max as described above. 2.  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 3.  New left adrenal gland metastasis. 4.  New omental metastatic lesion. 5.  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   Original Report Authenticated By: Rudie Meyer, M.D.    Dg Chest Port 1 View  06/14/2012  *RADIOLOGY REPORT*  Clinical Data: Chest pain.  PORTABLE CHEST - 1 VIEW  Comparison: CT chest 05/08/2012.  Two-view chest x-ray 01/13/2012.  Findings: A left perihilar mass is again noted.  Mild pulmonary vascular congestion is now present bilaterally.  Remote right-sided rib fractures are again seen.   IMPRESSION:  1.  New pulmonary vascular congestion. 2.  Persistent left perihilar mass. 3.  Remote right-sided rib fractures.   Original Report Authenticated By: Marin Roberts, M.D.    Dg Knee Complete 4 Views Left  06/14/2012  *RADIOLOGY REPORT*  Clinical Data: Left knee pain and swelling.  LEFT KNEE - COMPLETE 4+ VIEW  Comparison: None.  Findings: There is no evidence of fracture or dislocation.  The joint spaces are preserved.  Minimal osteophyte formation is noted at the tibial spine; the patellofemoral joint is grossly unremarkable in appearance.  A small sclerotic focus at the proximal tibia has a benign appearance.  Trace knee joint fluid remains within normal limits.  The visualized soft tissues are normal in appearance.  IMPRESSION: No evidence of fracture or dislocation.   Original Report Authenticated By: Tonia Ghent, M.D.     ASSESSMENT/PLAN: This is a very pleasant 52 years old white male with metastatic non-small cell lung cancer, adenocarcinoma initially diagnosed as stage IIIa status post neoadjuvant chemotherapy with carboplatin and Alimta followed by concurrent chemoradiation but unfortunately the patient has evidence for metastatic disease in the left adrenal gland as well as omental metastatic lesion and bone lesions involving the left acetabulum. Patient is currently receiving chemotherapy in the form of carboplatin for an AUC of 5, paclitaxel 175 mg per meter squared and Avastin at 15 mg per kilogram with Neulasta support given every 3 weeks, status post 1 cycle. Patient was discussed with Dr. Arbutus Ped. Overall he's tolerated his first cycle of chemotherapy with carboplatin, paclitaxel and Avastin with Neulasta support relatively well. Patient is advised to complete M.D. 9 remaining tablets of Diflucan that he has to address his oral candidiasis. He is given a prescription for OxyContin 20 mg tablets, one by mouth every 12 hours, a total of 60 with no refill as well as a  prescription for Dilaudid 4 mg tablets, one tablet by mouth every 4 hours as needed for breakthrough pain, total 30 tablets with no refill. He'll followup  with Dr. Arbutus Ped in 3 weeks with repeat CBC differential and C. met prior to cycle #2 of his systemic chemotherapy with carboplatin, paclitaxel and Avastin with Neulasta support.  Zeva Leber E, PA-C   He was advised to call immediately if he has any concerning symptoms in the interval. All questions were answered. The patient knows to call the clinic with any problems, questions or concerns. We can certainly see the patient much sooner if necessary.  I spent 20 minutes counseling the patient face to face. The total time spent in the appointment was 30 minutes.

## 2012-07-20 NOTE — Telephone Encounter (Signed)
Per staff phone call and POF I have schedueld appts.  JMW  

## 2012-07-20 NOTE — Telephone Encounter (Signed)
Per POF I have rescheduled appts from 5/27 to 6/3

## 2012-07-21 ENCOUNTER — Other Ambulatory Visit: Payer: Self-pay

## 2012-07-22 ENCOUNTER — Encounter: Payer: Self-pay | Admitting: Internal Medicine

## 2012-07-22 ENCOUNTER — Telehealth: Payer: Self-pay | Admitting: Medical Oncology

## 2012-07-22 DIAGNOSIS — R05 Cough: Secondary | ICD-10-CM

## 2012-07-22 MED ORDER — OMEPRAZOLE 40 MG PO CPDR: 40.0000 mg | DELAYED_RELEASE_CAPSULE | Freq: Two times a day (BID) | ORAL | Status: DC

## 2012-07-22 NOTE — Telephone Encounter (Signed)
Called in omeprazole, wife notified

## 2012-07-22 NOTE — Progress Notes (Signed)
Left msg for Ms. Manson Passey (case worker) at [phone].  Need to get pt's deductible amount to get his Medicaid reinstated.

## 2012-07-28 ENCOUNTER — Other Ambulatory Visit (HOSPITAL_BASED_OUTPATIENT_CLINIC_OR_DEPARTMENT_OTHER): Payer: Medicaid Other | Admitting: Lab

## 2012-07-28 DIAGNOSIS — C3430 Malignant neoplasm of lower lobe, unspecified bronchus or lung: Secondary | ICD-10-CM

## 2012-07-28 LAB — CBC WITH DIFFERENTIAL/PLATELET
Basophils Absolute: 0 10*3/uL (ref 0.0–0.1)
EOS%: 0.8 % (ref 0.0–7.0)
HCT: 38.7 % (ref 38.4–49.9)
HGB: 13.2 g/dL (ref 13.0–17.1)
MCH: 31.8 pg (ref 27.2–33.4)
MCV: 93.4 fL (ref 79.3–98.0)
MONO%: 7.8 % (ref 0.0–14.0)
NEUT%: 76.1 % — ABNORMAL HIGH (ref 39.0–75.0)

## 2012-07-28 LAB — COMPREHENSIVE METABOLIC PANEL (CC13)
AST: 21 U/L (ref 5–34)
Alkaline Phosphatase: 103 U/L (ref 40–150)
BUN: 11.7 mg/dL (ref 7.0–26.0)
Calcium: 9.3 mg/dL (ref 8.4–10.4)
Creatinine: 1 mg/dL (ref 0.7–1.3)

## 2012-07-30 ENCOUNTER — Encounter: Payer: Self-pay | Admitting: Internal Medicine

## 2012-07-30 NOTE — Progress Notes (Signed)
Pt is approved for 45% financial assistance effective 07/30/12 - 01/30/13.  I will mail approval letter and green card today.

## 2012-08-04 ENCOUNTER — Ambulatory Visit: Payer: Self-pay

## 2012-08-04 ENCOUNTER — Other Ambulatory Visit: Payer: Self-pay | Admitting: Lab

## 2012-08-04 ENCOUNTER — Encounter: Payer: Self-pay | Admitting: Internal Medicine

## 2012-08-04 NOTE — Progress Notes (Signed)
Left another msg for Ms. Manson Passey (case worker) at [phone]. Need to get pt's deductible amount to get his Medicaid reinstated.

## 2012-08-11 ENCOUNTER — Encounter: Payer: Self-pay | Admitting: Internal Medicine

## 2012-08-11 ENCOUNTER — Other Ambulatory Visit (HOSPITAL_BASED_OUTPATIENT_CLINIC_OR_DEPARTMENT_OTHER): Payer: Medicaid Other | Admitting: Lab

## 2012-08-11 ENCOUNTER — Encounter: Payer: Self-pay | Admitting: *Deleted

## 2012-08-11 ENCOUNTER — Ambulatory Visit (HOSPITAL_BASED_OUTPATIENT_CLINIC_OR_DEPARTMENT_OTHER): Payer: Medicaid Other | Admitting: Internal Medicine

## 2012-08-11 ENCOUNTER — Ambulatory Visit (HOSPITAL_BASED_OUTPATIENT_CLINIC_OR_DEPARTMENT_OTHER): Payer: Medicaid Other

## 2012-08-11 ENCOUNTER — Telehealth: Payer: Self-pay | Admitting: Internal Medicine

## 2012-08-11 DIAGNOSIS — C3430 Malignant neoplasm of lower lobe, unspecified bronchus or lung: Secondary | ICD-10-CM

## 2012-08-11 DIAGNOSIS — Z5111 Encounter for antineoplastic chemotherapy: Secondary | ICD-10-CM

## 2012-08-11 DIAGNOSIS — R109 Unspecified abdominal pain: Secondary | ICD-10-CM

## 2012-08-11 DIAGNOSIS — Z5112 Encounter for antineoplastic immunotherapy: Secondary | ICD-10-CM

## 2012-08-11 LAB — COMPREHENSIVE METABOLIC PANEL (CC13)
Albumin: 3.1 g/dL — ABNORMAL LOW (ref 3.5–5.0)
Alkaline Phosphatase: 93 U/L (ref 40–150)
BUN: 13 mg/dL (ref 7.0–26.0)
CO2: 25 mEq/L (ref 22–29)
Calcium: 9.6 mg/dL (ref 8.4–10.4)
Glucose: 103 mg/dl — ABNORMAL HIGH (ref 70–99)
Potassium: 4.4 mEq/L (ref 3.5–5.1)

## 2012-08-11 LAB — CBC WITH DIFFERENTIAL/PLATELET
Basophils Absolute: 0 10*3/uL (ref 0.0–0.1)
EOS%: 2.3 % (ref 0.0–7.0)
Eosinophils Absolute: 0.2 10*3/uL (ref 0.0–0.5)
HCT: 37.8 % — ABNORMAL LOW (ref 38.4–49.9)
HGB: 12.6 g/dL — ABNORMAL LOW (ref 13.0–17.1)
MCH: 30.9 pg (ref 27.2–33.4)
MCV: 92.6 fL (ref 79.3–98.0)
NEUT%: 73.2 % (ref 39.0–75.0)
lymph#: 1 10*3/uL (ref 0.9–3.3)

## 2012-08-11 MED ORDER — PACLITAXEL CHEMO INJECTION 300 MG/50ML
175.0000 mg/m2 | Freq: Once | INTRAVENOUS | Status: AC
  Administered 2012-08-11: 372 mg via INTRAVENOUS
  Filled 2012-08-11: qty 62

## 2012-08-11 MED ORDER — SODIUM CHLORIDE 0.9 % IV SOLN
687.0000 mg | Freq: Once | INTRAVENOUS | Status: AC
  Administered 2012-08-11: 690 mg via INTRAVENOUS
  Filled 2012-08-11: qty 69

## 2012-08-11 MED ORDER — FAMOTIDINE IN NACL 20-0.9 MG/50ML-% IV SOLN
20.0000 mg | Freq: Once | INTRAVENOUS | Status: AC
Start: 2012-08-11 — End: 2012-08-11
  Administered 2012-08-11: 20 mg via INTRAVENOUS

## 2012-08-11 MED ORDER — DIPHENHYDRAMINE HCL 50 MG/ML IJ SOLN
50.0000 mg | Freq: Once | INTRAMUSCULAR | Status: AC
  Administered 2012-08-11: 50 mg via INTRAVENOUS

## 2012-08-11 MED ORDER — SODIUM CHLORIDE 0.9 % IV SOLN
15.0000 mg/kg | Freq: Once | INTRAVENOUS | Status: AC
  Administered 2012-08-11: 1375 mg via INTRAVENOUS
  Filled 2012-08-11: qty 55

## 2012-08-11 MED ORDER — SODIUM CHLORIDE 0.9 % IV SOLN
Freq: Once | INTRAVENOUS | Status: AC
  Administered 2012-08-11: 10:00:00 via INTRAVENOUS

## 2012-08-11 MED ORDER — DEXAMETHASONE SODIUM PHOSPHATE 20 MG/5ML IJ SOLN
20.0000 mg | Freq: Once | INTRAMUSCULAR | Status: AC
  Administered 2012-08-11: 20 mg via INTRAVENOUS

## 2012-08-11 MED ORDER — ONDANSETRON 16 MG/50ML IVPB (CHCC)
16.0000 mg | Freq: Once | INTRAVENOUS | Status: AC
Start: 2012-08-11 — End: 2012-08-11
  Administered 2012-08-11: 16 mg via INTRAVENOUS

## 2012-08-11 NOTE — Progress Notes (Signed)
[facility] Telephone:([phone]) [phone]   Fax:(336) [phone]  OFFICE PROGRESS NOTE  No PCP Per Patient [address] [place] [address]  DIAGNOSIS: Metastatic non-small cell lung cancer, initially diagnosed as Stage IIB/IIIA, adenocarcinoma with a large left perihilar mass and questionable mediastinal lymphadenopathy diagnosed in July of 2013   PRIOR THERAPY:  1) Neoadjuvant chemotherapy with cisplatin 75 mg/M2 and Alimta 500 mg/M2 given every 3 weeks. The patient is status post 3 cycles.  2) Concurrent chemoradiation with weekly carboplatin for AUC of 2 and paclitaxel 45 mg/M2, last dose was given 03/30/2012 with partial response.   CURRENT THERAPY: Systemic chemotherapy with carboplatin for AUC of 5, paclitaxel 175 mg/M2 and Avastin 15 mg/kg with Neulasta support every 3 weeks. Status post 1 cycle.   INTERVAL HISTORY: Roger Mckinney 52 y.o. male returns to the clinic today for followup visit accompanied his wife. The patient tolerated the first cycle of his systemic chemotherapy fairly well and better than that I expected especially with his bad experience with the previous chemotherapy regimens. He denied having any significant nausea or vomiting. He denied having any chest pain, shortness breath, cough or hemoptysis. He continues to have occasional abdominal pain. He has no diarrhea or constipation. The patient is here today to start cycle #2 of chemotherapy.  MEDICAL HISTORY: Past Medical History  Diagnosis Date  . PNA (pneumonia)   . Lung mass   . Mitral valve prolapse   . Asthma     as a child  . GERD (gastroesophageal reflux disease)     hx of   . HPV (human papilloma virus) infection     hx of  . Cancer 2013  . Dental disease 2013  September    lower teeth removed.   bone graft   . History of chemotherapy     carboplatin/taxol    ALLERGIES:  is allergic to hydrocodone-homatropine and penicillins.  MEDICATIONS:  Current Outpatient Prescriptions    Medication Sig Dispense Refill  . ALPRAZolam (XANAX) 0.25 MG tablet Take 1 tablet (0.25 mg total) by mouth 3 (three) times daily as needed for sleep.  60 tablet  0  . benzonatate (TESSALON) 200 MG capsule Take 1 capsule (200 mg total) by mouth 3 (three) times daily as needed. For cough  30 capsule  1  . dexamethasone (DECADRON) 4 MG tablet Take 1 tablet (4 mg total) by mouth 2 (two) times daily with a meal.  60 tablet  0  . doxycycline (VIBRA-TABS) 100 MG tablet Take 1 tablet (100 mg total) by mouth 2 (two) times daily.  28 tablet  0  . fluconazole (DIFLUCAN) 200 MG tablet Take 1 tablet (200 mg total) by mouth daily.  10 tablet  0  . HYDROmorphone (DILAUDID) 4 MG tablet Take 1 tablet (4 mg total) by mouth every 4 (four) hours as needed for pain.  30 tablet  0  . omeprazole (PRILOSEC) 40 MG capsule Take 1 capsule (40 mg total) by mouth 2 (two) times daily.  60 capsule  0  . oxyCODONE (OXYCONTIN) 20 MG 12 hr tablet Take 1 tablet (20 mg total) by mouth every 12 (twelve) hours.  60 tablet  0  . Oxycodone HCl 10 MG TABS Take 1 tablet by mouth every 6 hours as needed for pain  60 tablet  0  . prochlorperazine (COMPAZINE) 10 MG tablet Take 1 tablet (10 mg total) by mouth every 6 (six) hours as needed.  30 tablet  2   No current facility-administered medications for this visit.   Facility-Administered Medications Ordered in Other Visits  Medication Dose Route Frequency Provider Last Rate Last Dose  . topical emolient (BIAFINE) emulsion   Topical Daily Billie Lade, MD        SURGICAL HISTORY:  Past Surgical History  Procedure Laterality Date  . Appendectomy    . Tonsillectomy    . Video bronchoscopy with endobronchial ultrasound  01/14/2012    Procedure: VIDEO BRONCHOSCOPY WITH ENDOBRONCHIAL ULTRASOUND;  Surgeon: Delight Ovens, MD;  Location: [facility] OR;  Service: Thoracic;  Laterality: N/A;  . Mediastinoscopy  01/14/2012    Procedure: MEDIASTINOSCOPY;  Surgeon: Delight Ovens, MD;  Location:  [facility] OR;  Service: Thoracic;  Laterality: N/A;    REVIEW OF SYSTEMS:  A comprehensive review of systems was negative.   PHYSICAL EXAMINATION: General appearance: alert, cooperative and no distress Head: Normocephalic, without obvious abnormality, atraumatic Neck: no adenopathy Lymph nodes: Cervical, supraclavicular, and axillary nodes normal. Resp: clear to auscultation bilaterally Cardio: regular rate and rhythm, S1, S2 normal, no murmur, click, rub or gallop GI: soft, non-tender; bowel sounds normal; no masses,  no organomegaly Extremities: extremities normal, atraumatic, no cyanosis or edema Neurologic: Alert and oriented X 3, normal strength and tone. Normal symmetric reflexes. Normal coordination and gait  ECOG PERFORMANCE STATUS: 1 - Symptomatic but completely ambulatory  Blood pressure 110/74, pulse 88, temperature 97.4 F (36.3 C), temperature source Oral, resp. rate 18, height 5\' 10"  (1.778 m), weight 203 lb 9.6 oz (92.352 kg).  LABORATORY DATA: Lab Results  Component Value Date   WBC 7.6 07/28/2012   HGB 13.2 07/28/2012   HCT 38.7 07/28/2012   MCV 93.4 07/28/2012   PLT 133* 07/28/2012      Chemistry      Component Value Date/Time   NA 135* 07/28/2012 1249   NA 135 06/14/2012 2012   K 4.1 07/28/2012 1249   K 4.2 06/14/2012 2012   CL 101 07/28/2012 1249   CL 99 06/14/2012 2012   CO2 24 07/28/2012 1249   CO2 24 06/14/2012 2012   BUN 11.7 07/28/2012 1249   BUN 15 06/14/2012 2012   CREATININE 1.0 07/28/2012 1249   CREATININE 0.95 06/14/2012 2012      Component Value Date/Time   CALCIUM 9.3 07/28/2012 1249   CALCIUM 9.6 06/14/2012 2012   ALKPHOS 103 07/28/2012 1249   ALKPHOS 111 01/13/2012 0849   AST 21 07/28/2012 1249   AST 17 01/13/2012 0849   ALT 28 07/28/2012 1249   ALT 9 01/13/2012 0849   BILITOT 0.43 07/28/2012 1249   BILITOT 0.6 01/13/2012 0849       RADIOGRAPHIC STUDIES: No results found.  ASSESSMENT: This is a very pleasant 52 years old white male with metastatic non-small  cell lung cancer, adenocarcinoma currently on systemic chemotherapy with carboplatin, paclitaxel and Avastin status post 1 cycle. The patient is tolerating his treatment fairly well   PLAN: I recommended for him to continue with cycle #2 today as scheduled. The patient would come back for followup visit in 3 weeks with the third cycle of his chemotherapy. For pain management, he will continue on OxyContin and Diluadid.  He was advised to call immediately if he has any concerning symptoms in the interval.  All questions were answered. The patient knows to call the clinic with any problems, questions or concerns. We can certainly see the patient much sooner if necessary.  I spent 15 minutes counseling the  patient face to face. The total time spent in the appointment was 25 minutes.

## 2012-08-11 NOTE — Patient Instructions (Signed)
Continue chemotherapy today as scheduled  Followup in 3 weeks. 

## 2012-08-11 NOTE — Patient Instructions (Addendum)
[facility] Discharge Instructions for Patients Receiving Chemotherapy  Today you received the following chemotherapy agents Taxol/Carboplatin/Avastin.  To help prevent nausea and vomiting after your treatment, we encourage you to take your nausea medication as directed.   If you develop nausea and vomiting that is not controlled by your nausea medication, call the clinic. If it is after clinic hours your family physician or the after hours number for the clinic or go to the Emergency Department.   BELOW ARE SYMPTOMS THAT SHOULD BE REPORTED IMMEDIATELY:  *FEVER GREATER THAN 100.5 F  *CHILLS WITH OR WITHOUT FEVER  NAUSEA AND VOMITING THAT IS NOT CONTROLLED WITH YOUR NAUSEA MEDICATION  *UNUSUAL SHORTNESS OF BREATH  *UNUSUAL BRUISING OR BLEEDING  TENDERNESS IN MOUTH AND THROAT WITH OR WITHOUT PRESENCE OF ULCERS  *URINARY PROBLEMS  *BOWEL PROBLEMS  UNUSUAL RASH Items with * indicate a potential emergency and should be followed up as soon as possible.

## 2012-08-11 NOTE — Telephone Encounter (Signed)
gv and printed appt schecd and avs for pt....pt ok and aware

## 2012-08-12 ENCOUNTER — Ambulatory Visit (HOSPITAL_BASED_OUTPATIENT_CLINIC_OR_DEPARTMENT_OTHER): Payer: Medicaid Other

## 2012-08-12 ENCOUNTER — Telehealth: Payer: Self-pay | Admitting: *Deleted

## 2012-08-12 DIAGNOSIS — C3430 Malignant neoplasm of lower lobe, unspecified bronchus or lung: Secondary | ICD-10-CM

## 2012-08-12 MED ORDER — PEGFILGRASTIM INJECTION 6 MG/0.6ML
6.0000 mg | Freq: Once | SUBCUTANEOUS | Status: AC
  Administered 2012-08-12: 6 mg via SUBCUTANEOUS
  Filled 2012-08-12: qty 0.6

## 2012-08-12 NOTE — Telephone Encounter (Signed)
Pt's wife called stating that she feels pt's appts are not correct.  Schedule reviewed and it appears that a CT scan that was originally schedule for 6/9 when he was on observation was not removed from his schedule and weekly labs have not been scheduled.  Left message with [name] stating that it does appear there are errors and to call back so this can be clarified.  Onc tx schedule filled out to correct errors.  SLJ

## 2012-08-13 ENCOUNTER — Telehealth: Payer: Self-pay | Admitting: *Deleted

## 2012-08-13 ENCOUNTER — Telehealth: Payer: Self-pay | Admitting: Internal Medicine

## 2012-08-13 NOTE — Telephone Encounter (Signed)
Per staff message and POF I have scheduled appts.  JMW  

## 2012-08-13 NOTE — Telephone Encounter (Signed)
s.w. pt wife and advised on weekly labs and tx....she will pick up updated sched at nxt visit

## 2012-08-14 ENCOUNTER — Other Ambulatory Visit: Payer: Self-pay | Admitting: Medical Oncology

## 2012-08-14 NOTE — Telephone Encounter (Signed)
I told pt and wife that we can do a refill on oxycontin on Tuesday. He does not like the way the dilaudid makes him feel so i told pt to use oxycodone 10 mg every 6 hours in between oxycontin 20 mg every 12 hours and stop dilaudid. He voices understanding.

## 2012-08-17 ENCOUNTER — Other Ambulatory Visit: Payer: Medicaid Other | Admitting: Lab

## 2012-08-17 ENCOUNTER — Ambulatory Visit (HOSPITAL_COMMUNITY): Payer: Medicaid Other

## 2012-08-17 ENCOUNTER — Encounter (HOSPITAL_COMMUNITY): Payer: Self-pay | Admitting: Emergency Medicine

## 2012-08-17 ENCOUNTER — Emergency Department (HOSPITAL_COMMUNITY): Payer: Self-pay

## 2012-08-17 ENCOUNTER — Ambulatory Visit: Payer: Medicaid Other | Admitting: Internal Medicine

## 2012-08-17 ENCOUNTER — Emergency Department (HOSPITAL_COMMUNITY)
Admission: EM | Admit: 2012-08-17 | Discharge: 2012-08-17 | Disposition: A | Discharge status: Home / Self Care | Payer: Self-pay | Attending: Emergency Medicine | Admitting: Emergency Medicine

## 2012-08-17 DIAGNOSIS — R059 Cough, unspecified: Secondary | ICD-10-CM | POA: Insufficient documentation

## 2012-08-17 DIAGNOSIS — Z8679 Personal history of other diseases of the circulatory system: Secondary | ICD-10-CM | POA: Insufficient documentation

## 2012-08-17 DIAGNOSIS — Z9221 Personal history of antineoplastic chemotherapy: Secondary | ICD-10-CM | POA: Insufficient documentation

## 2012-08-17 DIAGNOSIS — Z880 Allergy status to penicillin: Secondary | ICD-10-CM | POA: Insufficient documentation

## 2012-08-17 DIAGNOSIS — Y9389 Activity, other specified: Secondary | ICD-10-CM | POA: Insufficient documentation

## 2012-08-17 DIAGNOSIS — J45909 Unspecified asthma, uncomplicated: Secondary | ICD-10-CM | POA: Insufficient documentation

## 2012-08-17 DIAGNOSIS — Y929 Unspecified place or not applicable: Secondary | ICD-10-CM | POA: Insufficient documentation

## 2012-08-17 DIAGNOSIS — R05 Cough: Secondary | ICD-10-CM | POA: Insufficient documentation

## 2012-08-17 DIAGNOSIS — S2341XA Sprain of ribs, initial encounter: Secondary | ICD-10-CM | POA: Insufficient documentation

## 2012-08-17 DIAGNOSIS — Z862 Personal history of diseases of the blood and blood-forming organs and certain disorders involving the immune mechanism: Secondary | ICD-10-CM | POA: Insufficient documentation

## 2012-08-17 DIAGNOSIS — X500XXA Overexertion from strenuous movement or load, initial encounter: Secondary | ICD-10-CM | POA: Insufficient documentation

## 2012-08-17 DIAGNOSIS — Z8619 Personal history of other infectious and parasitic diseases: Secondary | ICD-10-CM | POA: Insufficient documentation

## 2012-08-17 DIAGNOSIS — Z8639 Personal history of other endocrine, nutritional and metabolic disease: Secondary | ICD-10-CM | POA: Insufficient documentation

## 2012-08-17 DIAGNOSIS — K219 Gastro-esophageal reflux disease without esophagitis: Secondary | ICD-10-CM | POA: Insufficient documentation

## 2012-08-17 DIAGNOSIS — Z87891 Personal history of nicotine dependence: Secondary | ICD-10-CM | POA: Insufficient documentation

## 2012-08-17 DIAGNOSIS — Z8701 Personal history of pneumonia (recurrent): Secondary | ICD-10-CM | POA: Insufficient documentation

## 2012-08-17 DIAGNOSIS — Z85118 Personal history of other malignant neoplasm of bronchus and lung: Secondary | ICD-10-CM | POA: Insufficient documentation

## 2012-08-17 MED ORDER — OXYCODONE HCL 5 MG PO TABS
10.0000 mg | ORAL_TABLET | Freq: Once | ORAL | Status: AC
  Administered 2012-08-17: 10 mg via ORAL
  Filled 2012-08-17: qty 2

## 2012-08-17 NOTE — ED Notes (Addendum)
Pt presenting to ed with c/o right side rib pain pt states he had a cough spell and thinks that he maybe pop a rib. Pt states he felt a pop

## 2012-08-17 NOTE — ED Provider Notes (Signed)
History     CSN: [id number]  Arrival date & time 08/17/12  1643   First MD Initiated Contact with Patient 08/17/12 1802      Chief Complaint  Patient presents with  . ribcage pain    (Consider location/radiation/quality/duration/timing/severity/associated sxs/prior treatment) HPI Comments: 52 year old male with a past medical history of lung cancer presents to the emergency department complaining of right-sided rib pain after having a coughing spell around 3 clock this afternoon. Patient states he has a chronic cough from lung cancer, no changes, and occasionally either pulled muscles or "pops" his rib. States this feels similar to when he "popped it" in the past. Denies difficulty breathing or shortness of breath. Pain described as "intense" rated 10 out of 10. He takes oxycodone every morning at 9:00 and again 12 hours later with 4 mg tablet of Dilaudid in the middle, however he was afraid to take any more pain medication than that because he did not know if they're be side effects. Oncologist is Dr. Gwenyth Bouillon, last chemotherapy was about one week ago, due for his next treatment in 2 weeks. Denies fever, chills, nausea or vomiting.  The history is provided by the patient, the spouse and a relative.    Past Medical History  Diagnosis Date  . PNA (pneumonia)   . Lung mass   . Mitral valve prolapse   . Asthma     as a child  . GERD (gastroesophageal reflux disease)     hx of   . HPV (human papilloma virus) infection     hx of  . Cancer 2013  . Dental disease 2013  September    lower teeth removed.   bone graft   . History of chemotherapy     carboplatin/taxol    Past Surgical History  Procedure Laterality Date  . Appendectomy    . Tonsillectomy    . Video bronchoscopy with endobronchial ultrasound  01/14/2012    Procedure: VIDEO BRONCHOSCOPY WITH ENDOBRONCHIAL ULTRASOUND;  Surgeon: Delight Ovens, MD;  Location: [facility] OR;  Service: Thoracic;  Laterality: N/A;  .  Mediastinoscopy  01/14/2012    Procedure: MEDIASTINOSCOPY;  Surgeon: Delight Ovens, MD;  Location: [facility] OR;  Service: Thoracic;  Laterality: N/A;    Family History  Problem Relation Age of Onset  . Diabetes Mother   . Breast cancer Sister   . Breast cancer Sister   . Lung cancer Father     was a smoker  . Colon cancer Paternal Grandmother   . Lung cancer Paternal Grandmother     never a smoker  . Lupus Sister     History  Substance Use Topics  . Smoking status: Former Smoker -- 1.00 packs/day for 30 years    Types: Cigarettes    Quit date: 09/20/2011  . Smokeless tobacco: Never Used  . Alcohol Use: No      Review of Systems  Constitutional: Negative for fever and chills.  Respiratory: Positive for cough. Negative for shortness of breath.   Cardiovascular: Negative for chest pain.  Gastrointestinal: Negative for nausea and vomiting.  Musculoskeletal:       Positive for right sided rib pain.  All other systems reviewed and are negative.    Allergies  Hydrocodone-homatropine and Penicillins  Home Medications   Current Outpatient Rx  Name  Route  Sig  Dispense  Refill  . benzonatate (TESSALON) 200 MG capsule   Oral   Take 1 capsule (200 mg total) by mouth 3 (three) times  daily as needed. For cough   30 capsule   1   . diphenhydrAMINE (BENADRYL) 25 MG tablet   Oral   Take 25 mg by mouth every 6 (six) hours as needed for itching.         Marland Kitchen HYDROmorphone (DILAUDID) 4 MG tablet   Oral   Take 1 tablet (4 mg total) by mouth every 4 (four) hours as needed for pain.   30 tablet   0   . loratadine (CLARITIN) 10 MG tablet   Oral   Take 10 mg by mouth daily.         Marland Kitchen omeprazole (PRILOSEC) 40 MG capsule   Oral   Take 1 capsule (40 mg total) by mouth 2 (two) times daily.   60 capsule   0     Take 40 mg bid for 1 week then take 40 mg daily   . oxyCODONE (OXYCONTIN) 20 MG 12 hr tablet   Oral   Take 1 tablet (20 mg total) by mouth every 12 (twelve)  hours.   60 tablet   0   . Oxycodone HCl 10 MG TABS      Take 1 tablet by mouth every 6 hours as needed for pain   60 tablet   0   . prochlorperazine (COMPAZINE) 10 MG tablet   Oral   Take 1 tablet (10 mg total) by mouth every 6 (six) hours as needed.   30 tablet   2     BP 139/76  Pulse 99  Temp(Src) 98.4 F (36.9 C) (Oral)  Resp 20  SpO2 99%  Physical Exam  Nursing note and vitals reviewed. Constitutional: He is oriented to person, place, and time. He appears well-developed and well-nourished.  Dry cough present.  HENT:  Head: Normocephalic and atraumatic.  Mouth/Throat: Oropharynx is clear and moist.  Eyes: Conjunctivae are normal.  Neck: Normal range of motion. Neck supple.  Cardiovascular: Normal rate, regular rhythm, normal heart sounds and intact distal pulses.   Pulmonary/Chest: Effort normal and breath sounds normal. No respiratory distress. He has no decreased breath sounds. He has no wheezes. He has no rales.      Tender to palpation of right posterior lower ribs. No step-off, overlying edema or ecchymosis.  Musculoskeletal: Normal range of motion. He exhibits no edema.  Neurological: He is alert and oriented to person, place, and time.  Skin: Skin is warm and dry. He is not diaphoretic.  Psychiatric: He has a normal mood and affect. His behavior is normal.    ED Course  Procedures (including critical care time)  Labs Reviewed - No data to display Dg Chest 2 View  08/17/2012   *RADIOLOGY REPORT*  Clinical Data: Cough and chest pain.  History of metastatic non- small cell lung carcinoma.  CHEST - 2 VIEW  Comparison: PET scan on 06/26/2012, CT of the chest on 06/14/2012 and chest radiograph from 06/14/2012.  Findings: Left-sided perihilar lung opacity likely relates to both the known underlying perihilar tumor and post radiation therapy changes in the lung. There is no evidence of pulmonary edema, pleural fluid, pneumothorax or additional masses.  Old and  not healed right-sided posterior rib fractures are again seen.  Heart size is within normal limits.  IMPRESSION: No definite acute findings.  Left perihilar changes likely relate to residual underlying tumor and radiation therapy changes.   Original Report Authenticated By: Irish Lack, M.D.     1. Sprain and strain of ribs, initial encounter  2. Chronic cough       MDM  52 y/o male with rib pain from chronic cough. No acute coughing symptoms present. Xray negative for new fracture. No step-off of bruising. He was afraid to take extra pain medication at home. I gave him oxycodone in the ED with great relief of both cough and rib pain. He will follow up with his oncologist. Return precautions discussed. Patient and family state understanding of plan and are agreeable.       Trevor Mace, PA-C 08/17/12 1933

## 2012-08-18 ENCOUNTER — Other Ambulatory Visit (HOSPITAL_BASED_OUTPATIENT_CLINIC_OR_DEPARTMENT_OTHER): Payer: PRIVATE HEALTH INSURANCE

## 2012-08-18 ENCOUNTER — Other Ambulatory Visit: Payer: Medicaid Other

## 2012-08-18 DIAGNOSIS — C3490 Malignant neoplasm of unspecified part of unspecified bronchus or lung: Secondary | ICD-10-CM

## 2012-08-18 LAB — COMPREHENSIVE METABOLIC PANEL (CC13)
Albumin: 3.1 g/dL — ABNORMAL LOW (ref 3.5–5.0)
BUN: 16.9 mg/dL (ref 7.0–26.0)
Calcium: 9.6 mg/dL (ref 8.4–10.4)
Chloride: 102 mEq/L (ref 98–107)
Glucose: 195 mg/dl — ABNORMAL HIGH (ref 70–99)
Potassium: 4.2 mEq/L (ref 3.5–5.1)

## 2012-08-18 LAB — CBC WITH DIFFERENTIAL/PLATELET
Basophils Absolute: 0.1 10*3/uL (ref 0.0–0.1)
Eosinophils Absolute: 0.1 10*3/uL (ref 0.0–0.5)
HGB: 11.8 g/dL — ABNORMAL LOW (ref 13.0–17.1)
MONO#: 0.5 10*3/uL (ref 0.1–0.9)
NEUT#: 4 10*3/uL (ref 1.5–6.5)
RDW: 16.2 % — ABNORMAL HIGH (ref 11.0–14.6)
lymph#: 0.9 10*3/uL (ref 0.9–3.3)

## 2012-08-19 ENCOUNTER — Encounter (HOSPITAL_COMMUNITY): Payer: Self-pay | Admitting: Emergency Medicine

## 2012-08-19 ENCOUNTER — Ambulatory Visit: Payer: Medicaid Other | Admitting: Internal Medicine

## 2012-08-19 ENCOUNTER — Emergency Department (HOSPITAL_COMMUNITY): Payer: Self-pay

## 2012-08-19 ENCOUNTER — Emergency Department (HOSPITAL_COMMUNITY)
Admission: EM | Admit: 2012-08-19 | Discharge: 2012-08-19 | Disposition: A | Discharge status: Home / Self Care | Payer: Self-pay | Attending: Emergency Medicine | Admitting: Emergency Medicine

## 2012-08-19 ENCOUNTER — Other Ambulatory Visit: Payer: Self-pay | Admitting: Medical Oncology

## 2012-08-19 DIAGNOSIS — M549 Dorsalgia, unspecified: Secondary | ICD-10-CM | POA: Insufficient documentation

## 2012-08-19 DIAGNOSIS — Z8709 Personal history of other diseases of the respiratory system: Secondary | ICD-10-CM | POA: Insufficient documentation

## 2012-08-19 DIAGNOSIS — R0789 Other chest pain: Secondary | ICD-10-CM | POA: Insufficient documentation

## 2012-08-19 DIAGNOSIS — C3490 Malignant neoplasm of unspecified part of unspecified bronchus or lung: Secondary | ICD-10-CM | POA: Insufficient documentation

## 2012-08-19 DIAGNOSIS — IMO0001 Reserved for inherently not codable concepts without codable children: Secondary | ICD-10-CM | POA: Insufficient documentation

## 2012-08-19 DIAGNOSIS — Z8719 Personal history of other diseases of the digestive system: Secondary | ICD-10-CM | POA: Insufficient documentation

## 2012-08-19 DIAGNOSIS — Z87891 Personal history of nicotine dependence: Secondary | ICD-10-CM | POA: Insufficient documentation

## 2012-08-19 DIAGNOSIS — R059 Cough, unspecified: Secondary | ICD-10-CM | POA: Insufficient documentation

## 2012-08-19 DIAGNOSIS — R0781 Pleurodynia: Secondary | ICD-10-CM

## 2012-08-19 DIAGNOSIS — Z880 Allergy status to penicillin: Secondary | ICD-10-CM | POA: Insufficient documentation

## 2012-08-19 DIAGNOSIS — Z9221 Personal history of antineoplastic chemotherapy: Secondary | ICD-10-CM | POA: Insufficient documentation

## 2012-08-19 DIAGNOSIS — K219 Gastro-esophageal reflux disease without esophagitis: Secondary | ICD-10-CM | POA: Insufficient documentation

## 2012-08-19 DIAGNOSIS — Z79899 Other long term (current) drug therapy: Secondary | ICD-10-CM | POA: Insufficient documentation

## 2012-08-19 DIAGNOSIS — Z8619 Personal history of other infectious and parasitic diseases: Secondary | ICD-10-CM | POA: Insufficient documentation

## 2012-08-19 DIAGNOSIS — Z859 Personal history of malignant neoplasm, unspecified: Secondary | ICD-10-CM | POA: Insufficient documentation

## 2012-08-19 DIAGNOSIS — Z8679 Personal history of other diseases of the circulatory system: Secondary | ICD-10-CM | POA: Insufficient documentation

## 2012-08-19 DIAGNOSIS — R05 Cough: Secondary | ICD-10-CM | POA: Insufficient documentation

## 2012-08-19 DIAGNOSIS — J45909 Unspecified asthma, uncomplicated: Secondary | ICD-10-CM | POA: Insufficient documentation

## 2012-08-19 MED ORDER — DIAZEPAM 5 MG PO TABS: 5.0000 mg | ORAL_TABLET | ORAL | Status: DC

## 2012-08-19 MED ORDER — HYDROMORPHONE HCL 4 MG PO TABS: 4.0000 mg | ORAL_TABLET | ORAL | Status: DC | PRN

## 2012-08-19 MED ORDER — BENZONATATE 200 MG PO CAPS: 200.0000 mg | ORAL_CAPSULE | Freq: Three times a day (TID) | ORAL | Status: DC | PRN

## 2012-08-19 MED ORDER — HYDROMORPHONE HCL PF 2 MG/ML IJ SOLN
2.0000 mg | Freq: Once | INTRAMUSCULAR | Status: AC
  Administered 2012-08-19: 2 mg via INTRAMUSCULAR
  Filled 2012-08-19: qty 1

## 2012-08-19 MED ORDER — DIAZEPAM 5 MG PO TABS
10.0000 mg | ORAL_TABLET | Freq: Once | ORAL | Status: AC
  Administered 2012-08-19: 10 mg via ORAL
  Filled 2012-08-19: qty 2

## 2012-08-19 MED ORDER — DIAZEPAM 5 MG PO TABS
5.0000 mg | ORAL_TABLET | Freq: Once | ORAL | Status: AC
Start: 2012-08-19 — End: 2012-08-19
  Administered 2012-08-19: 5 mg via ORAL
  Filled 2012-08-19: qty 1

## 2012-08-19 MED ORDER — OXYCODONE HCL 20 MG PO TB12: 20.0000 mg | ORAL_TABLET | Freq: Two times a day (BID) | ORAL | Status: DC

## 2012-08-19 NOTE — ED Notes (Signed)
Patient transported to X-ray 

## 2012-08-19 NOTE — ED Notes (Signed)
[id number]<DG> Expected date:<BR> Expected time:<BR> Means of arrival:<BR> Comments:<BR> ems

## 2012-08-19 NOTE — ED Provider Notes (Addendum)
Medical screening examination/treatment/procedure(s) were conducted as a shared visit with non-physician practitioner(s) and myself.  I personally evaluated the patient during the encounter  Pt with persistent chest wall pain, xrays neg, will tx pain--doubt acs or pe   10:29 PM Patient's pain is more controlled at this time. We'll followup the cancer Center tomorrow  Toy Baker, MD 08/19/12 2026  Toy Baker, MD 08/19/12 2229

## 2012-08-19 NOTE — Telephone Encounter (Signed)
Called in tesslon pearles refill

## 2012-08-19 NOTE — ED Provider Notes (Signed)
History     CSN: [id number]  Arrival date & time 08/19/12  7829   First MD Initiated Contact with Patient 08/19/12 1840      Chief Complaint  Patient presents with  . Back Pain  . Rib cage pain     (Consider location/radiation/quality/duration/timing/severity/associated sxs/prior treatment) HPI Pt is a 52yo male with hx of lung cancer presenting for 2nd time since 6/9 for right sided right pain that is constant dull ache, with intermittent sharp pain upon coughing, 10/10.  Pt states "it feels like something keeps popping."  Pt was evaluated in ED, CXR showed: No definite acute findings. Left perihilar changes likely relate to residual underlying tumor and radiation therapy changes.  Pt was advised to f/u with oncologist but today pt's wife states they did not think to f/u with oncologist because they did not know this pain was going to return.   Today's pain started around 1600 after pt coughed.  Denies trauma to the area.  Denies fever.     Past Medical History  Diagnosis Date  . PNA (pneumonia)   . Lung mass   . Mitral valve prolapse   . Asthma     as a child  . GERD (gastroesophageal reflux disease)     hx of   . HPV (human papilloma virus) infection     hx of  . Cancer 2013  . Dental disease 2013  September    lower teeth removed.   bone graft   . History of chemotherapy     carboplatin/taxol    Past Surgical History  Procedure Laterality Date  . Appendectomy    . Tonsillectomy    . Video bronchoscopy with endobronchial ultrasound  01/14/2012    Procedure: VIDEO BRONCHOSCOPY WITH ENDOBRONCHIAL ULTRASOUND;  Surgeon: Delight Ovens, MD;  Location: [facility] OR;  Service: Thoracic;  Laterality: N/A;  . Mediastinoscopy  01/14/2012    Procedure: MEDIASTINOSCOPY;  Surgeon: Delight Ovens, MD;  Location: [facility] OR;  Service: Thoracic;  Laterality: N/A;    Family History  Problem Relation Age of Onset  . Diabetes Mother   . Breast cancer Sister   . Breast cancer Sister   .  Lung cancer Father     was a smoker  . Colon cancer Paternal Grandmother   . Lung cancer Paternal Grandmother     never a smoker  . Lupus Sister     History  Substance Use Topics  . Smoking status: Former Smoker -- 1.00 packs/day for 30 years    Types: Cigarettes    Quit date: 09/20/2011  . Smokeless tobacco: Never Used  . Alcohol Use: No      Review of Systems  Constitutional: Negative for fever and chills.  Respiratory: Positive for cough. Negative for shortness of breath and wheezing.   Cardiovascular: Positive for chest pain ( right posterior and lateral ribs near ribs 5-8).  Musculoskeletal: Positive for myalgias and back pain ( right side).  All other systems reviewed and are negative.    Allergies  Hydrocodone-homatropine and Penicillins  Home Medications   Current Outpatient Rx  Name  Route  Sig  Dispense  Refill  . benzonatate (TESSALON) 200 MG capsule   Oral   Take 1 capsule (200 mg total) by mouth 3 (three) times daily as needed. For cough   30 capsule   1   . diphenhydrAMINE (BENADRYL) 25 MG tablet   Oral   Take 25 mg by mouth every 6 (six) hours as  needed for itching.         Marland Kitchen HYDROmorphone (DILAUDID) 4 MG tablet   Oral   Take 1 tablet (4 mg total) by mouth every 4 (four) hours as needed for pain.   30 tablet   0   . loratadine (CLARITIN) 10 MG tablet   Oral   Take 10 mg by mouth daily.         Marland Kitchen omeprazole (PRILOSEC) 40 MG capsule   Oral   Take 40 mg by mouth daily.         Marland Kitchen oxyCODONE (OXYCONTIN) 20 MG 12 hr tablet   Oral   Take 1 tablet (20 mg total) by mouth every 12 (twelve) hours.   60 tablet   0   . Oxycodone HCl 10 MG TABS   Oral   Take 10 mg by mouth every 6 (six) hours as needed (for breakthrough pain).         Marland Kitchen PRESCRIPTION MEDICATION   Intravenous   Inject into the vein every 21 ( twenty-one) days. Taxol, Paraplatin and Avastin infusion q3weeks.         . prochlorperazine (COMPAZINE) 10 MG tablet   Oral    Take 10 mg by mouth every 6 (six) hours as needed (for nausea).         . diazepam (VALIUM) 5 MG tablet   Oral   Take 1 tablet (5 mg total) by mouth every 4 (four) hours.   20 tablet   0     BP 133/86  Pulse 96  Temp(Src) 98.7 F (37.1 C) (Oral)  Resp 18  SpO2 96%  Physical Exam  Nursing note and vitals reviewed. Constitutional: He appears well-developed and well-nourished.  Pt lying on left side in exam bed, appears uncomfortable.   HENT:  Head: Normocephalic and atraumatic.  Eyes: Conjunctivae are normal. No scleral icterus.  Neck: Normal range of motion. Neck supple.  Cardiovascular: Normal rate, regular rhythm and normal heart sounds.   Pulmonary/Chest: Effort normal and breath sounds normal. No respiratory distress. He has no wheezes. He has no rales.   He exhibits tenderness ( TTP in right ribs 5-8).    Right chest wall tenderness. TTP over posterior ribs 5-8 w/o crepitus. No visible deformity or overlying erythema, rash, ecchymosis, or wound.  Musculoskeletal: Normal range of motion. He exhibits tenderness. He exhibits no edema.  Neurological: He is alert.  Skin: Skin is warm and dry. He is not diaphoretic.  Psychiatric: He has a normal mood and affect. His behavior is normal.    ED Course  Procedures (including critical care time)  Labs Reviewed - No data to display Dg Chest 2 View  08/19/2012   *RADIOLOGY REPORT*  Clinical Data: Cough and shortness breath.  History of metastatic non-small cell lung carcinoma.  CHEST - 2 VIEW  Comparison: 08/17/2012  Findings: Lung volumes are lower compared to the prior exam.  There remains left perihilar airspace opacity at the level of the known left-sided lung cancer and prior radiation therapy.  Opacity in the left perihilar lung may be slightly more prominent compared to the prior study and a component of acute infiltrate is not excluded. No pulmonary edema or pleural fluid is identified.  IMPRESSION: Lower lung volumes.   Slightly more prominent left perihilar lung opacity which is at the level of the previously treated left lung carcinoma.  A component of acute infiltrate cannot be excluded.   Original Report Authenticated By: Irish Lack, M.D.  1. Rib pain on right side   2. Lung cancer, right       MDM  Pt with lung cancer presented with severe right side pain that he has been having difficulty getting under control. Afraid to take too much pain medication at home.  Pt continuously c/o spasms in his ribs every time he took a breath but denies ever trying a muscle relaxer.  Valium was given to pt who reports moderate relief of spasms.  Dilaudid was given for pain which helped as well.  CXR: showed no acute changes since last CXR.  Pain control was achieved in ED.    Rx: valium.  F/u with cancer center tomorrow to address better pain control.. Return precautions given. Pt verbalized understanding and agreement with tx plan. Vitals: unremarkable. Discharged in stable condition.   Discussed pt with Dr. Freida Busman during ED encounter.        Junius Finner, PA-C 08/20/12 1443

## 2012-08-19 NOTE — Telephone Encounter (Signed)
rx signed and locked in injection room.

## 2012-08-19 NOTE — ED Notes (Signed)
PER EMS- pt picked up from home with c/o rib cage pain x1 week.  Pt was seen here last week for the same problem.  Pt denies injury.  Hx of lung CA.  Lung sounds clear.  Pt also c/o cough.  Pt reports cough caused increased pain.  Pt alert and oriented.

## 2012-08-20 ENCOUNTER — Encounter: Payer: Self-pay | Admitting: Internal Medicine

## 2012-08-20 NOTE — ED Provider Notes (Signed)
Medical screening examination/treatment/procedure(s) were performed by non-physician practitioner and as supervising physician I was immediately available for consultation/collaboration.  [place], MD 08/20/12 0053 

## 2012-08-20 NOTE — Progress Notes (Signed)
Pt's case worker Ms. Manson Passey still hasn't called me back so I called her supervisor Ms. Mariann Laster at [phone] to get the pt's deductible amount and her fax number to get his Medicaid reactivated.

## 2012-08-21 ENCOUNTER — Other Ambulatory Visit: Payer: Self-pay | Admitting: Medical Oncology

## 2012-08-21 ENCOUNTER — Telehealth: Payer: Self-pay | Admitting: Medical Oncology

## 2012-08-21 ENCOUNTER — Other Ambulatory Visit: Payer: Self-pay | Admitting: *Deleted

## 2012-08-21 NOTE — Telephone Encounter (Signed)
Erroneous

## 2012-08-21 NOTE — Telephone Encounter (Signed)
Wife reports pt is in agony in pain right lower chest. " I have to keep him drugged so he will sleep".  He took Oxycontin 20 , xanax and benadryl at 0500. Per Dr Arbutus Ped I told [name] that Dr Arbutus Ped said to double his oxycontin dose to 40 mg every 12 hours. I told [name] to  give him another oxycontin 20 mg now and to call me at 12 to give me an update.

## 2012-08-21 NOTE — Telephone Encounter (Signed)
[name] called back and said the double dose of Oxycontin seems to be  controlling  his pain. I told [name] to keep Roger Mckinney on a 6a-6p schedule for the Oxycontin 40 q 12 hour.

## 2012-08-23 NOTE — ED Provider Notes (Signed)
Medical screening examination/treatment/procedure(s) were performed by non-physician practitioner and as supervising physician I was immediately available for consultation/collaboration.  Jobin Montelongo T Emmani Lesueur, MD 08/23/12 1537 

## 2012-08-25 ENCOUNTER — Ambulatory Visit: Payer: Medicaid Other

## 2012-08-25 ENCOUNTER — Other Ambulatory Visit (HOSPITAL_BASED_OUTPATIENT_CLINIC_OR_DEPARTMENT_OTHER): Payer: PRIVATE HEALTH INSURANCE | Admitting: Lab

## 2012-08-25 ENCOUNTER — Other Ambulatory Visit: Payer: Medicaid Other | Admitting: Lab

## 2012-08-25 DIAGNOSIS — C3430 Malignant neoplasm of lower lobe, unspecified bronchus or lung: Secondary | ICD-10-CM

## 2012-08-25 LAB — CBC WITH DIFFERENTIAL/PLATELET
BASO%: 0.8 % (ref 0.0–2.0)
EOS%: 1.6 % (ref 0.0–7.0)
HCT: 32.4 % — ABNORMAL LOW (ref 38.4–49.9)
LYMPH%: 9.9 % — ABNORMAL LOW (ref 14.0–49.0)
MCH: 32.2 pg (ref 27.2–33.4)
MCHC: 35.7 g/dL (ref 32.0–36.0)
MONO#: 0.9 10*3/uL (ref 0.1–0.9)
NEUT%: 78.5 % — ABNORMAL HIGH (ref 39.0–75.0)
Platelets: 137 10*3/uL — ABNORMAL LOW (ref 140–400)
RBC: 3.59 10*6/uL — ABNORMAL LOW (ref 4.20–5.82)
WBC: 9.6 10*3/uL (ref 4.0–10.3)
lymph#: 1 10*3/uL (ref 0.9–3.3)

## 2012-08-25 LAB — COMPREHENSIVE METABOLIC PANEL (CC13)
ALT: 15 U/L (ref 0–55)
AST: 17 U/L (ref 5–34)
Alkaline Phosphatase: 105 U/L (ref 40–150)
BUN: 11 mg/dL (ref 7.0–26.0)
Creatinine: 1 mg/dL (ref 0.7–1.3)

## 2012-08-25 LAB — RESEARCH LABS

## 2012-08-26 ENCOUNTER — Emergency Department (HOSPITAL_COMMUNITY)
Admission: EM | Admit: 2012-08-26 | Discharge: 2012-08-27 | Disposition: A | Discharge status: Home / Self Care | Payer: Self-pay | Attending: Emergency Medicine | Admitting: Emergency Medicine

## 2012-08-26 ENCOUNTER — Encounter (HOSPITAL_COMMUNITY): Payer: Self-pay | Admitting: *Deleted

## 2012-08-26 ENCOUNTER — Telehealth: Payer: Self-pay | Admitting: Oncology

## 2012-08-26 ENCOUNTER — Emergency Department (HOSPITAL_COMMUNITY): Payer: Self-pay

## 2012-08-26 ENCOUNTER — Telehealth: Payer: Self-pay | Admitting: *Deleted

## 2012-08-26 DIAGNOSIS — R209 Unspecified disturbances of skin sensation: Secondary | ICD-10-CM | POA: Insufficient documentation

## 2012-08-26 DIAGNOSIS — K089 Disorder of teeth and supporting structures, unspecified: Secondary | ICD-10-CM | POA: Insufficient documentation

## 2012-08-26 DIAGNOSIS — R059 Cough, unspecified: Secondary | ICD-10-CM | POA: Insufficient documentation

## 2012-08-26 DIAGNOSIS — Z79899 Other long term (current) drug therapy: Secondary | ICD-10-CM | POA: Insufficient documentation

## 2012-08-26 DIAGNOSIS — L02619 Cutaneous abscess of unspecified foot: Secondary | ICD-10-CM | POA: Insufficient documentation

## 2012-08-26 DIAGNOSIS — L03115 Cellulitis of right lower limb: Secondary | ICD-10-CM

## 2012-08-26 DIAGNOSIS — Z8709 Personal history of other diseases of the respiratory system: Secondary | ICD-10-CM | POA: Insufficient documentation

## 2012-08-26 DIAGNOSIS — K219 Gastro-esophageal reflux disease without esophagitis: Secondary | ICD-10-CM | POA: Insufficient documentation

## 2012-08-26 DIAGNOSIS — R071 Chest pain on breathing: Secondary | ICD-10-CM | POA: Insufficient documentation

## 2012-08-26 DIAGNOSIS — R05 Cough: Secondary | ICD-10-CM | POA: Insufficient documentation

## 2012-08-26 DIAGNOSIS — C801 Malignant (primary) neoplasm, unspecified: Secondary | ICD-10-CM | POA: Insufficient documentation

## 2012-08-26 DIAGNOSIS — R51 Headache: Secondary | ICD-10-CM | POA: Insufficient documentation

## 2012-08-26 DIAGNOSIS — Z87891 Personal history of nicotine dependence: Secondary | ICD-10-CM | POA: Insufficient documentation

## 2012-08-26 DIAGNOSIS — Z8701 Personal history of pneumonia (recurrent): Secondary | ICD-10-CM | POA: Insufficient documentation

## 2012-08-26 DIAGNOSIS — Z8679 Personal history of other diseases of the circulatory system: Secondary | ICD-10-CM | POA: Insufficient documentation

## 2012-08-26 DIAGNOSIS — L539 Erythematous condition, unspecified: Secondary | ICD-10-CM | POA: Insufficient documentation

## 2012-08-26 DIAGNOSIS — R600 Localized edema: Secondary | ICD-10-CM

## 2012-08-26 DIAGNOSIS — Z8619 Personal history of other infectious and parasitic diseases: Secondary | ICD-10-CM | POA: Insufficient documentation

## 2012-08-26 MED ORDER — SULFAMETHOXAZOLE-TMP DS 800-160 MG PO TABS
1.0000 | ORAL_TABLET | Freq: Once | ORAL | Status: AC
  Administered 2012-08-27: 1 via ORAL
  Filled 2012-08-26: qty 1

## 2012-08-26 NOTE — ED Provider Notes (Signed)
History     CSN: [id number]  Arrival date & time 08/26/12  2233   First MD Initiated Contact with Patient 08/26/12 2310      Chief Complaint  Patient presents with  . Leg Swelling    (Consider location/radiation/quality/duration/timing/severity/associated sxs/prior treatment) HPI  52 year old male with history of non small cell carcinoma currently receiving chemotherapy presents complaining of right foot swelling and pain.  She states he has been coughing a lot for the past week. In the process he fracture his rib 9 days ago from persistent cough. He has been bed rest. Today he notice tingling and pain and swelling to his right foot. Pain is described as an achy sensation, worsening with walking. He also noticed swelling and redness to his foot. He denies any specific trauma. He however admits to long period of sitting at home and is concern of blood clots. he denies having prior blood clot. Endorse consisting cough but denies hemoptysis, or increased dyspnea on exertion. No complaint of fever, chills, headache. Continues to endorse pleuritic chest pain. Headache chest x-ray done a week ago that shows a slightly more prominent left perihilar lung opacity at the level of previously treated L lung carcinoma.  Cannot exclude acute infiltrate.  However, it was felt that pt does not have PNA, no abx given.    Past Medical History  Diagnosis Date  . PNA (pneumonia)   . Lung mass   . Mitral valve prolapse   . Asthma     as a child  . GERD (gastroesophageal reflux disease)     hx of   . HPV (human papilloma virus) infection     hx of  . Cancer 2013  . Dental disease 2013  September    lower teeth removed.   bone graft   . History of chemotherapy     carboplatin/taxol    Past Surgical History  Procedure Laterality Date  . Appendectomy    . Tonsillectomy    . Video bronchoscopy with endobronchial ultrasound  01/14/2012    Procedure: VIDEO BRONCHOSCOPY WITH ENDOBRONCHIAL ULTRASOUND;   Surgeon: Delight Ovens, MD;  Location: [facility] OR;  Service: Thoracic;  Laterality: N/A;  . Mediastinoscopy  01/14/2012    Procedure: MEDIASTINOSCOPY;  Surgeon: Delight Ovens, MD;  Location: [facility] OR;  Service: Thoracic;  Laterality: N/A;    Family History  Problem Relation Age of Onset  . Diabetes Mother   . Breast cancer Sister   . Breast cancer Sister   . Lung cancer Father     was a smoker  . Colon cancer Paternal Grandmother   . Lung cancer Paternal Grandmother     never a smoker  . Lupus Sister     History  Substance Use Topics  . Smoking status: Former Smoker -- 1.00 packs/day for 30 years    Types: Cigarettes    Quit date: 09/20/2011  . Smokeless tobacco: Never Used  . Alcohol Use: No      Review of Systems  All other systems reviewed and are negative.    Allergies  Hydrocodone-homatropine and Penicillins  Home Medications   Current Outpatient Rx  Name  Route  Sig  Dispense  Refill  . benzonatate (TESSALON) 200 MG capsule   Oral   Take 1 capsule (200 mg total) by mouth 3 (three) times daily as needed. For cough   30 capsule   1   . diazepam (VALIUM) 5 MG tablet   Oral   Take 1 tablet (  5 mg total) by mouth every 4 (four) hours.   20 tablet   0   . diphenhydrAMINE (BENADRYL) 25 MG tablet   Oral   Take 25 mg by mouth every 6 (six) hours as needed for itching.         Marland Kitchen HYDROmorphone (DILAUDID) 4 MG tablet   Oral   Take 1 tablet (4 mg total) by mouth every 4 (four) hours as needed for pain.   30 tablet   0   . loratadine (CLARITIN) 10 MG tablet   Oral   Take 10 mg by mouth daily.         Marland Kitchen omeprazole (PRILOSEC) 40 MG capsule   Oral   Take 40 mg by mouth daily.         Marland Kitchen oxyCODONE (OXYCONTIN) 20 MG 12 hr tablet   Oral   Take 1 tablet (20 mg total) by mouth every 12 (twelve) hours.   60 tablet   0   . Oxycodone HCl 10 MG TABS   Oral   Take 10 mg by mouth every 6 (six) hours as needed (for breakthrough pain).         Marland Kitchen  PRESCRIPTION MEDICATION   Intravenous   Inject into the vein every 21 ( twenty-one) days. Taxol, Paraplatin and Avastin infusion q3weeks.         . prochlorperazine (COMPAZINE) 10 MG tablet   Oral   Take 10 mg by mouth every 6 (six) hours as needed (for nausea).           BP 127/97  Pulse 110  Temp(Src) 99.4 F (37.4 C)  Resp 20  Ht 5\' 10"  (1.778 m)  Wt 203 lb (92.08 kg)  BMI 29.13 kg/m2  SpO2 96%  Physical Exam  Nursing note and vitals reviewed. Constitutional: He is oriented to person, place, and time. He appears well-developed and well-nourished. No distress.  HENT:  Head: Atraumatic.  Eyes: Conjunctivae are normal.  Pulmonary/Chest: He has no wheezes. He exhibits tenderness (Tenderness to right lower anterior ribs on palpation without crepitus noted.).  Persistent cough  Abdominal: There is no tenderness.  Musculoskeletal: He exhibits edema (R lower leg with 1+ pitting edema to dorsum of foot.  moderate blanchable erythema noted throughout dorsum of foot suggestsive of cellulitis.).  BLE: no palpable cords, negative Homan's sign.  Intact distal pulses.  Neurological: He is alert and oriented to person, place, and time.  Skin:  Erythema to dorsum of R foot  Psychiatric: He has a normal mood and affect.    ED Course  Procedures (including critical care time)  Patient presents complaining of left foot pain and swelling. With history of non-small cell cancer certainly patient is at increased risk of developing DVT. On examination patient has evidence of cellulitis to right foot. I suggest treating with abx.  However, pt sts he does not tolerates PCN and doxycycline.  Will treat with bactrim.  Will also give lovenox tonight and have pt scheduled for doppler study tomorrow to r/o DVT of R leg.    cxr improves from prior.  Care discussed with attending.    Labs Reviewed - No data to display Dg Chest 2 View  08/27/2012   *RADIOLOGY REPORT*  Clinical Data: Pleuritic chest  pain.  CHEST - 2 VIEW  Comparison: Chest x-ray 08/19/2012.  Findings: Postradiation changes in the left lung with masslike architectural distortion emanating from the treated mass in the superior segment of the left lower lobe.  Compared  to the recent prior examination, the extent of airspace consolidation in the perihilar aspect of the left lung appears decreased, suggesting resolving infection or inflammation.  No definite new sites of airspace consolidation are identified.  No pleural effusions.  Old fractures of the posterolateral aspect of the right seventh and eighth ribs with incomplete union redemonstrated.  No evidence of pulmonary edema.  Heart size is normal.  IMPRESSION: 1.  Overall, the appearance of the chest has improved compared to the recent prior study, as above.  The appearance of the left hemithorax is compatible with this time with chronic postradiation changes.   Original Report Authenticated By: Trudie Reed, M.D.     1. Cellulitis of right foot   2. Pedal edema       MDM  BP 127/97  Pulse 110  Temp(Src) 99.4 F (37.4 C)  Resp 20  Ht 5\' 10"  (1.778 m)  Wt 203 lb (92.08 kg)  BMI 29.13 kg/m2  SpO2 96%  I have reviewed nursing notes and vital signs. I personally reviewed the imaging tests through PACS system  I reviewed available ER/hospitalization records thought the EMR         Fayrene Helper, PA-C 08/27/12 0040

## 2012-08-26 NOTE — Telephone Encounter (Signed)
Medical Oncology on call  Wife calling as left foot swollen, which she has just noticed. No swelling in calf, no tenderness, slight swelling also right foot. He is in bed now, often sits on side of bed with left foot hanging off. They will elevate his feet now, will let us know if more problems tonight or if swelling is not improved with elevation by AM.  Ila Mcgill, MD

## 2012-08-26 NOTE — Telephone Encounter (Signed)
Pt's wife called stating that pt is c/o a sunburn type irritation on his head, back, and face.  It is sensitive to the touch.  No pustules, swelling or bumps noted.  He noticed it yesterday.  Per Dr Donnald Garre it is okay for him to take some benadryl and make sure she avoid the sun.  She verbalized understanding and is to call if there are any changes between now and when he comes in for f/u on 6/24.  SLJ

## 2012-08-26 NOTE — ED Notes (Addendum)
Per pt and spouse - pt noted to have rt foot swelling and errythema that began today, pt admits to current hx of small cell carcinoma and rt rib fracture dx on 08/17/2012 - pt is currently getting chemotherapy treatments. Pt admits to long periods of sitting at home. Pt and spouse concerned for blood clots.

## 2012-08-26 NOTE — ED Notes (Signed)
AVW:[id number]> Expected date:<BR> Expected time:<BR> Means of arrival:<BR> Comments:<BR> Tr 1

## 2012-08-27 ENCOUNTER — Ambulatory Visit (HOSPITAL_COMMUNITY)
Admission: RE | Admit: 2012-08-27 | Discharge: 2012-08-27 | Disposition: A | Discharge status: Home / Self Care | Payer: PRIVATE HEALTH INSURANCE | Source: Ambulatory Visit | Attending: Emergency Medicine | Admitting: Emergency Medicine

## 2012-08-27 DIAGNOSIS — C3490 Malignant neoplasm of unspecified part of unspecified bronchus or lung: Secondary | ICD-10-CM | POA: Insufficient documentation

## 2012-08-27 DIAGNOSIS — M7989 Other specified soft tissue disorders: Secondary | ICD-10-CM | POA: Insufficient documentation

## 2012-08-27 DIAGNOSIS — L02619 Cutaneous abscess of unspecified foot: Secondary | ICD-10-CM | POA: Insufficient documentation

## 2012-08-27 DIAGNOSIS — L03119 Cellulitis of unspecified part of limb: Secondary | ICD-10-CM | POA: Insufficient documentation

## 2012-08-27 DIAGNOSIS — L02419 Cutaneous abscess of limb, unspecified: Secondary | ICD-10-CM | POA: Insufficient documentation

## 2012-08-27 MED ORDER — ENOXAPARIN SODIUM 100 MG/ML ~~LOC~~ SOLN
1.0000 mg/kg | SUBCUTANEOUS | Status: AC
  Administered 2012-08-27: 90 mg via SUBCUTANEOUS
  Filled 2012-08-27: qty 1

## 2012-08-27 MED ORDER — SULFAMETHOXAZOLE-TRIMETHOPRIM 800-160 MG PO TABS: 1.0000 | ORAL_TABLET | Freq: Two times a day (BID) | ORAL | Status: DC

## 2012-08-27 NOTE — ED Notes (Signed)
Pt ambulating independently w/ steady gait on d/c in no acute distress, A&Ox4. D/c instructions reviewed w/ pt and family - pt and family deny any further questions or concerns at present. Pt provided with antibiotics from pharmacy d/t pt's wife reports financial hardship and can not afford prescription medication.

## 2012-08-27 NOTE — Progress Notes (Signed)
VASCULAR LAB PRELIMINARY  PRELIMINARY  PRELIMINARY  PRELIMINARY  Right lower extremity venous duplex completed.    Preliminary report:  Right:  No evidence of DVT, superficial thrombosis, or Baker's cyst.  Roger Mckinney, RVT 08/27/2012, 12:03 PM

## 2012-08-27 NOTE — ED Provider Notes (Signed)
Medical screening examination/treatment/procedure(s) were conducted as a shared visit with non-physician practitioner(s) and myself.  I personally evaluated the patient during the encounter.  Pt with acute right foot/ankle redness and swelling.  Ddx includes cellulitis or DVT.  Pt has h/o lung ca, ongoing tx.  Will treat for cellulitis, have patient return in am for doppler studies.  Will give lovenox x 1 until study can be completed.  Olivia Mackie, MD 08/27/12 [phone]

## 2012-08-28 ENCOUNTER — Other Ambulatory Visit: Payer: Self-pay | Admitting: *Deleted

## 2012-09-01 ENCOUNTER — Encounter: Payer: Self-pay | Admitting: *Deleted

## 2012-09-01 ENCOUNTER — Other Ambulatory Visit (HOSPITAL_BASED_OUTPATIENT_CLINIC_OR_DEPARTMENT_OTHER): Payer: PRIVATE HEALTH INSURANCE

## 2012-09-01 ENCOUNTER — Encounter: Payer: Self-pay | Admitting: Internal Medicine

## 2012-09-01 ENCOUNTER — Other Ambulatory Visit: Payer: Medicaid Other | Admitting: Lab

## 2012-09-01 ENCOUNTER — Ambulatory Visit: Payer: PRIVATE HEALTH INSURANCE

## 2012-09-01 ENCOUNTER — Telehealth: Payer: Self-pay | Admitting: *Deleted

## 2012-09-01 ENCOUNTER — Telehealth: Payer: Self-pay | Admitting: Internal Medicine

## 2012-09-01 ENCOUNTER — Other Ambulatory Visit: Payer: Self-pay | Admitting: Physician Assistant

## 2012-09-01 ENCOUNTER — Ambulatory Visit (HOSPITAL_BASED_OUTPATIENT_CLINIC_OR_DEPARTMENT_OTHER): Payer: PRIVATE HEALTH INSURANCE | Admitting: Internal Medicine

## 2012-09-01 VITALS — BP 110/78 | HR 99 | Temp 96.9°F | Resp 18 | Ht 70.0 in | Wt 202.0 lb

## 2012-09-01 DIAGNOSIS — R590 Localized enlarged lymph nodes: Secondary | ICD-10-CM

## 2012-09-01 DIAGNOSIS — C3491 Malignant neoplasm of unspecified part of right bronchus or lung: Secondary | ICD-10-CM

## 2012-09-01 DIAGNOSIS — C3430 Malignant neoplasm of lower lobe, unspecified bronchus or lung: Secondary | ICD-10-CM

## 2012-09-01 DIAGNOSIS — C3490 Malignant neoplasm of unspecified part of unspecified bronchus or lung: Secondary | ICD-10-CM

## 2012-09-01 LAB — CBC WITH DIFFERENTIAL/PLATELET
Basophils Absolute: 0.1 10*3/uL (ref 0.0–0.1)
EOS%: 1.5 % (ref 0.0–7.0)
HCT: 33 % — ABNORMAL LOW (ref 38.4–49.9)
HGB: 11.5 g/dL — ABNORMAL LOW (ref 13.0–17.1)
LYMPH%: 15.9 % (ref 14.0–49.0)
MCH: 31.6 pg (ref 27.2–33.4)
MCHC: 34.7 g/dL (ref 32.0–36.0)
MCV: 91 fL (ref 79.3–98.0)
MONO%: 11.7 % (ref 0.0–14.0)
NEUT%: 69.6 % (ref 39.0–75.0)
Platelets: 156 10*3/uL (ref 140–400)

## 2012-09-01 LAB — COMPREHENSIVE METABOLIC PANEL (CC13)
Albumin: 3 g/dL — ABNORMAL LOW (ref 3.5–5.0)
Alkaline Phosphatase: 90 U/L (ref 40–150)
BUN: 12 mg/dL (ref 7.0–26.0)
Creatinine: 1.1 mg/dL (ref 0.7–1.3)
Glucose: 133 mg/dl — ABNORMAL HIGH (ref 70–99)
Potassium: 4.2 mEq/L (ref 3.5–5.1)

## 2012-09-01 MED ORDER — MORPHINE SULFATE ER 60 MG PO TBCR: 60.0000 mg | EXTENDED_RELEASE_TABLET | Freq: Two times a day (BID) | ORAL | Status: DC

## 2012-09-01 MED ORDER — HYDROMORPHONE HCL 4 MG PO TABS: 4.0000 mg | ORAL_TABLET | ORAL | Status: AC | PRN

## 2012-09-01 MED ORDER — OXYCODONE HCL 40 MG PO TB12: 40.0000 mg | ORAL_TABLET | Freq: Two times a day (BID) | ORAL | Status: DC

## 2012-09-01 MED ORDER — BENZONATATE 200 MG PO CAPS: 200.0000 mg | ORAL_CAPSULE | Freq: Three times a day (TID) | ORAL | Status: DC | PRN

## 2012-09-01 NOTE — Patient Instructions (Signed)
We'll delay chemotherapy by one week because of current cellulitis. Followup visit in one month with repeat CT scan of the chest, abdomen and pelvis for restaging of her disease.

## 2012-09-01 NOTE — Progress Notes (Signed)
Darlena spoke to Ms. Fasheyitan and she said she would send Ms. Manson Passey and Ms. Mariann Laster an email requesting that they call me.  She did give a fax number so I faxed a bill from 08/11/12 for $[phone] to [phone].  Hopefully the case worker received it and will reactivate pt's Medicaid.

## 2012-09-01 NOTE — Telephone Encounter (Signed)
Per staff message and POF I have scheduled appts.  JMW  

## 2012-09-01 NOTE — Progress Notes (Signed)
[facility] [phone]   [phone]  OFFICE PROGRESS NOTE  No PCP Per Patient [address] [place] [address]  DIAGNOSIS: Metastatic non-small cell lung cancer, initially diagnosed as Stage IIB/IIIA, adenocarcinoma with a large left perihilar mass and questionable mediastinal lymphadenopathy diagnosed in July of 2013   PRIOR THERAPY:  1) Neoadjuvant chemotherapy with cisplatin 75 mg/M2 and Alimta 500 mg/M2 given every 3 weeks. The patient is status post 3 cycles.  2) Concurrent chemoradiation with weekly carboplatin for AUC of 2 and paclitaxel 45 mg/M2, last dose was given 03/30/2012 with partial response.   CURRENT THERAPY: Systemic chemotherapy with carboplatin for AUC of 5, paclitaxel 175 mg/M2 and Avastin 15 mg/kg with Neulasta support every 3 weeks. Status post 2 cycles.  INTERVAL HISTORY: Roger Mckinney 52 y.o. male returns to the clinic today for followup visit accompanied by his wife. The patient is feeling fine today with no specific complaints. He was recently seen at the emergency Department for evaluation of cellulitis of the right lower extremity and he was started on treatment with Septra few days ago. He is feeling much better but continues to have mild cellulitis in the right foot. The patient also broke a rib on the right side of his chest after cough. He denied having any significant shortness of breath, chest pain or hemoptysis. He has no weight loss or night sweats. He tolerated the last cycle of his systemic chemotherapy fairly well with no significant adverse effects. He has no nausea or vomiting. He has no fever or chills. He has no bleeding issues.  MEDICAL HISTORY: Past Medical History  Diagnosis Date  . PNA (pneumonia)   . Lung mass   . Mitral valve prolapse   . Asthma     as a child  . GERD (gastroesophageal reflux disease)     hx of   . HPV (human papilloma virus) infection     hx of  . Cancer 2013  . Dental  disease 2013  September    lower teeth removed.   bone graft   . History of chemotherapy     carboplatin/taxol    ALLERGIES:  is allergic to hydrocodone-homatropine and penicillins.  MEDICATIONS:  Current Outpatient Prescriptions  Medication Sig Dispense Refill  . benzonatate (TESSALON) 200 MG capsule Take 1 capsule (200 mg total) by mouth 3 (three) times daily as needed. For cough  30 capsule  1  . diphenhydrAMINE (BENADRYL) 25 MG tablet Take 25 mg by mouth every 6 (six) hours as needed for itching.      Marland Kitchen HYDROmorphone (DILAUDID) 4 MG tablet Take 1 tablet (4 mg total) by mouth every 4 (four) hours as needed for pain.  30 tablet  0  . loratadine (CLARITIN) 10 MG tablet Take 10 mg by mouth daily.      Marland Kitchen oxyCODONE (OXYCONTIN) 20 MG 12 hr tablet Take 40 mg by mouth every 12 (twelve) hours.      Marland Kitchen PRESCRIPTION MEDICATION Inject into the vein every 21 ( twenty-one) days. Taxol, Paraplatin and Avastin infusion q3weeks.      . prochlorperazine (COMPAZINE) 10 MG tablet Take 10 mg by mouth every 6 (six) hours as needed (for nausea).      . sulfamethoxazole-trimethoprim (SEPTRA DS) 800-160 MG per tablet Take 1 tablet by mouth every 12 (twelve) hours.  14 tablet  0   No current facility-administered medications for this visit.   Facility-Administered Medications Ordered in Other  Visits  Medication Dose Route Frequency Provider Last Rate Last Dose  . topical emolient (BIAFINE) emulsion   Topical Daily Billie Lade, MD        SURGICAL HISTORY:  Past Surgical History  Procedure Laterality Date  . Appendectomy    . Tonsillectomy    . Video bronchoscopy with endobronchial ultrasound  01/14/2012    Procedure: VIDEO BRONCHOSCOPY WITH ENDOBRONCHIAL ULTRASOUND;  Surgeon: Delight Ovens, MD;  Location: [facility] OR;  Service: Thoracic;  Laterality: N/A;  . Mediastinoscopy  01/14/2012    Procedure: MEDIASTINOSCOPY;  Surgeon: Delight Ovens, MD;  Location: [facility] OR;  Service: Thoracic;  Laterality: N/A;     REVIEW OF SYSTEMS:  A comprehensive review of systems was negative except for: Constitutional: positive for fatigue Respiratory: positive for cough Erythema and swelling of the right foot   PHYSICAL EXAMINATION: General appearance: alert, cooperative, fatigued and no distress Head: Normocephalic, without obvious abnormality, atraumatic Neck: no adenopathy Lymph nodes: Cervical, supraclavicular, and axillary nodes normal. Resp: clear to auscultation bilaterally Cardio: regular rate and rhythm, S1, S2 normal, no murmur, click, rub or gallop GI: soft, non-tender; bowel sounds normal; no masses,  no organomegaly Extremities: extremities normal, atraumatic, no cyanosis or edema Neurologic: Alert and oriented X 3, normal strength and tone. Normal symmetric reflexes. Normal coordination and gait  ECOG PERFORMANCE STATUS: 1 - Symptomatic but completely ambulatory  There were no vitals taken for this visit.  LABORATORY DATA: Lab Results  Component Value Date   WBC 5.7 09/01/2012   HGB 11.5* 09/01/2012   HCT 33.0* 09/01/2012   MCV 91.0 09/01/2012   PLT 156 09/01/2012      Chemistry      Component Value Date/Time   NA 134* 08/25/2012 1051   NA 135 06/14/2012 2012   K 4.2 08/25/2012 1051   K 4.2 06/14/2012 2012   CL 100 08/25/2012 1051   CL 99 06/14/2012 2012   CO2 24 08/25/2012 1051   CO2 24 06/14/2012 2012   BUN 11.0 08/25/2012 1051   BUN 15 06/14/2012 2012   CREATININE 1.0 08/25/2012 1051   CREATININE 0.95 06/14/2012 2012      Component Value Date/Time   CALCIUM 9.7 08/25/2012 1051   CALCIUM 9.6 06/14/2012 2012   ALKPHOS 105 08/25/2012 1051   ALKPHOS 111 01/13/2012 0849   AST 17 08/25/2012 1051   AST 17 01/13/2012 0849   ALT 15 08/25/2012 1051   ALT 9 01/13/2012 0849   BILITOT 0.71 08/25/2012 1051   BILITOT 0.6 01/13/2012 0849       RADIOGRAPHIC STUDIES: Dg Chest 2 View  08/27/2012   *RADIOLOGY REPORT*  Clinical Data: Pleuritic chest pain.  CHEST - 2 VIEW  Comparison: Chest x-ray 08/19/2012.   Findings: Postradiation changes in the left lung with masslike architectural distortion emanating from the treated mass in the superior segment of the left lower lobe.  Compared to the recent prior examination, the extent of airspace consolidation in the perihilar aspect of the left lung appears decreased, suggesting resolving infection or inflammation.  No definite new sites of airspace consolidation are identified.  No pleural effusions.  Old fractures of the posterolateral aspect of the right seventh and eighth ribs with incomplete union redemonstrated.  No evidence of pulmonary edema.  Heart size is normal.  IMPRESSION: 1.  Overall, the appearance of the chest has improved compared to the recent prior study, as above.  The appearance of the left hemithorax is compatible with this time with chronic  postradiation changes.   Original Report Authenticated By: Trudie Reed, M.D.   Dg Chest 2 View  08/19/2012   *RADIOLOGY REPORT*  Clinical Data: Cough and shortness breath.  History of metastatic non-small cell lung carcinoma.  CHEST - 2 VIEW  Comparison: 08/17/2012  Findings: Lung volumes are lower compared to the prior exam.  There remains left perihilar airspace opacity at the level of the known left-sided lung cancer and prior radiation therapy.  Opacity in the left perihilar lung may be slightly more prominent compared to the prior study and a component of acute infiltrate is not excluded. No pulmonary edema or pleural fluid is identified.  IMPRESSION: Lower lung volumes.  Slightly more prominent left perihilar lung opacity which is at the level of the previously treated left lung carcinoma.  A component of acute infiltrate cannot be excluded.   Original Report Authenticated By: Irish Lack, M.D.   Dg Chest 2 View  08/17/2012   *RADIOLOGY REPORT*  Clinical Data: Cough and chest pain.  History of metastatic non- small cell lung carcinoma.  CHEST - 2 VIEW  Comparison: PET scan on 06/26/2012, CT of the  chest on 06/14/2012 and chest radiograph from 06/14/2012.  Findings: Left-sided perihilar lung opacity likely relates to both the known underlying perihilar tumor and post radiation therapy changes in the lung. There is no evidence of pulmonary edema, pleural fluid, pneumothorax or additional masses.  Old and not healed right-sided posterior rib fractures are again seen.  Heart size is within normal limits.  IMPRESSION: No definite acute findings.  Left perihilar changes likely relate to residual underlying tumor and radiation therapy changes.   Original Report Authenticated By: Irish Lack, M.D.    ASSESSMENT AND PLAN: This is a very pleasant 52 years old white male with metastatic non-small cell lung cancer currently on systemic chemotherapy with carboplatin, paclitaxel and Avastin status post 2 cycles. He is rating his treatment fairly well, but he was recently diagnosed with cellulitis of the right foot. I will he the start of cycle #3 to next week until improvement in the cellulitis. The patient will continue treatment with Septra for now. For pain management, he was given a refill of OxyContin 40 mg by mouth every 12 hours in addition to Dilaudid for breakthrough pain. I would see the patient back for followup visit in one month with repeat CT scan of the chest, abdomen and pelvis for restaging of his disease. He was advised to call immediately if he has any concerning symptoms in the interval.  All questions were answered. The patient knows to call the clinic with any problems, questions or concerns. We can certainly see the patient much sooner if necessary.  I spent 20 minutes counseling the patient face to face. The total time spent in the appointment was 30 minutes.

## 2012-09-01 NOTE — Telephone Encounter (Signed)
gv and printed appt sched for pt...gv pt barium...emailed MW to add tx.

## 2012-09-01 NOTE — Progress Notes (Signed)
Ms. Roger Mckinney finally called me back and stated that pt's income has increased and is no longer eligible for Medicaid however the pt can reapply.  She will call the pt and make him aware of this.

## 2012-09-01 NOTE — Progress Notes (Signed)
09/01/12 @ 0845, BMS [id number], Visit 2:  Roger Mckinney, accompanied by his wife, into the [facility] for lab, MD and chemotherapy.  He had research labs drawn peripherally along with his labs for clinical assessment.  He then completed his questionnaires prior to seeing Dr. Arbutus Ped.

## 2012-09-02 ENCOUNTER — Ambulatory Visit: Payer: Medicaid Other

## 2012-09-08 ENCOUNTER — Other Ambulatory Visit: Payer: Medicaid Other

## 2012-09-09 ENCOUNTER — Other Ambulatory Visit (HOSPITAL_BASED_OUTPATIENT_CLINIC_OR_DEPARTMENT_OTHER): Payer: PRIVATE HEALTH INSURANCE | Admitting: Lab

## 2012-09-09 ENCOUNTER — Ambulatory Visit (HOSPITAL_BASED_OUTPATIENT_CLINIC_OR_DEPARTMENT_OTHER): Payer: PRIVATE HEALTH INSURANCE

## 2012-09-09 ENCOUNTER — Other Ambulatory Visit: Payer: Self-pay | Admitting: Internal Medicine

## 2012-09-09 DIAGNOSIS — Z5112 Encounter for antineoplastic immunotherapy: Secondary | ICD-10-CM

## 2012-09-09 DIAGNOSIS — C3430 Malignant neoplasm of lower lobe, unspecified bronchus or lung: Secondary | ICD-10-CM

## 2012-09-09 DIAGNOSIS — C3491 Malignant neoplasm of unspecified part of right bronchus or lung: Secondary | ICD-10-CM

## 2012-09-09 DIAGNOSIS — Z5111 Encounter for antineoplastic chemotherapy: Secondary | ICD-10-CM

## 2012-09-09 LAB — CBC WITH DIFFERENTIAL/PLATELET
Basophils Absolute: 0 10*3/uL (ref 0.0–0.1)
Eosinophils Absolute: 0.3 10*3/uL (ref 0.0–0.5)
HCT: 35.4 % — ABNORMAL LOW (ref 38.4–49.9)
HGB: 11.8 g/dL — ABNORMAL LOW (ref 13.0–17.1)
MCV: 93.7 fL (ref 79.3–98.0)
MONO%: 12.7 % (ref 0.0–14.0)
NEUT#: 4.1 10*3/uL (ref 1.5–6.5)
RDW: 18.3 % — ABNORMAL HIGH (ref 11.0–14.6)
lymph#: 0.9 10*3/uL (ref 0.9–3.3)

## 2012-09-09 LAB — COMPREHENSIVE METABOLIC PANEL (CC13)
ALT: 15 U/L (ref 0–55)
CO2: 25 mEq/L (ref 22–29)
Calcium: 9.8 mg/dL (ref 8.4–10.4)
Chloride: 102 mEq/L (ref 98–109)
Sodium: 137 mEq/L (ref 136–145)
Total Protein: 7.7 g/dL (ref 6.4–8.3)

## 2012-09-09 LAB — UA PROTEIN, DIPSTICK - CHCC: Protein, ur: <30 mg/dL

## 2012-09-09 MED ORDER — ONDANSETRON 16 MG/50ML IVPB (CHCC)
16.0000 mg | Freq: Once | INTRAVENOUS | Status: AC
  Administered 2012-09-09: 16 mg via INTRAVENOUS

## 2012-09-09 MED ORDER — FAMOTIDINE IN NACL 20-0.9 MG/50ML-% IV SOLN
20.0000 mg | Freq: Once | INTRAVENOUS | Status: AC
  Administered 2012-09-09: 20 mg via INTRAVENOUS

## 2012-09-09 MED ORDER — LORAZEPAM 2 MG/ML IJ SOLN
1.0000 mg | Freq: Once | INTRAMUSCULAR | Status: AC
  Administered 2012-09-09: 1 mg via INTRAVENOUS

## 2012-09-09 MED ORDER — PACLITAXEL CHEMO INJECTION 300 MG/50ML
175.0000 mg/m2 | Freq: Once | INTRAVENOUS | Status: AC
  Administered 2012-09-09: 372 mg via INTRAVENOUS
  Filled 2012-09-09: qty 62

## 2012-09-09 MED ORDER — SODIUM CHLORIDE 0.9 % IV SOLN
630.0000 mg | Freq: Once | INTRAVENOUS | Status: AC
  Administered 2012-09-09: 630 mg via INTRAVENOUS
  Filled 2012-09-09: qty 63

## 2012-09-09 MED ORDER — SODIUM CHLORIDE 0.9 % IV SOLN
15.0000 mg/kg | Freq: Once | INTRAVENOUS | Status: AC
  Administered 2012-09-09: 1375 mg via INTRAVENOUS
  Filled 2012-09-09: qty 55

## 2012-09-09 MED ORDER — SODIUM CHLORIDE 0.9 % IV SOLN
Freq: Once | INTRAVENOUS | Status: AC
  Administered 2012-09-09: 09:00:00 via INTRAVENOUS

## 2012-09-09 MED ORDER — DEXAMETHASONE SODIUM PHOSPHATE 20 MG/5ML IJ SOLN
20.0000 mg | Freq: Once | INTRAMUSCULAR | Status: AC
  Administered 2012-09-09: 20 mg via INTRAVENOUS

## 2012-09-09 MED ORDER — DIPHENHYDRAMINE HCL 50 MG/ML IJ SOLN
25.0000 mg | Freq: Once | INTRAMUSCULAR | Status: AC
  Administered 2012-09-09: 25 mg via INTRAVENOUS

## 2012-09-09 NOTE — Patient Instructions (Addendum)
[facility] Discharge Instructions for Patients Receiving Chemotherapy  Today you received the following chemotherapy agents Avastin/Taxol/Carboplatin.  To help prevent nausea and vomiting after your treatment, we encourage you to take your nausea medication as prescribed.   If you develop nausea and vomiting that is not controlled by your nausea medication, call the clinic.   BELOW ARE SYMPTOMS THAT SHOULD BE REPORTED IMMEDIATELY:  *FEVER GREATER THAN 100.5 F  *CHILLS WITH OR WITHOUT FEVER  NAUSEA AND VOMITING THAT IS NOT CONTROLLED WITH YOUR NAUSEA MEDICATION  *UNUSUAL SHORTNESS OF BREATH  *UNUSUAL BRUISING OR BLEEDING  TENDERNESS IN MOUTH AND THROAT WITH OR WITHOUT PRESENCE OF ULCERS  *URINARY PROBLEMS  *BOWEL PROBLEMS  UNUSUAL RASH Items with * indicate a potential emergency and should be followed up as soon as possible.  Feel free to call the clinic you have any questions or concerns. The clinic phone number is [phone].    

## 2012-09-09 NOTE — Progress Notes (Signed)
At end of treatment patient c/o severe nausea. Order obtained for Ativan 1 mg IV per Dr. Arbutus Ped. Patient stated relief after receiving Ativan.

## 2012-09-10 ENCOUNTER — Ambulatory Visit (HOSPITAL_BASED_OUTPATIENT_CLINIC_OR_DEPARTMENT_OTHER): Payer: PRIVATE HEALTH INSURANCE

## 2012-09-10 DIAGNOSIS — C3430 Malignant neoplasm of lower lobe, unspecified bronchus or lung: Secondary | ICD-10-CM

## 2012-09-10 DIAGNOSIS — Z5189 Encounter for other specified aftercare: Secondary | ICD-10-CM

## 2012-09-10 MED ORDER — PEGFILGRASTIM INJECTION 6 MG/0.6ML
6.0000 mg | Freq: Once | SUBCUTANEOUS | Status: AC
  Administered 2012-09-10: 6 mg via SUBCUTANEOUS
  Filled 2012-09-10: qty 0.6

## 2012-09-15 ENCOUNTER — Other Ambulatory Visit (HOSPITAL_BASED_OUTPATIENT_CLINIC_OR_DEPARTMENT_OTHER): Payer: PRIVATE HEALTH INSURANCE | Admitting: Lab

## 2012-09-15 ENCOUNTER — Encounter: Payer: Self-pay | Admitting: *Deleted

## 2012-09-15 DIAGNOSIS — C3430 Malignant neoplasm of lower lobe, unspecified bronchus or lung: Secondary | ICD-10-CM

## 2012-09-15 LAB — COMPREHENSIVE METABOLIC PANEL (CC13)
ALT: 16 U/L (ref 0–55)
AST: 19 U/L (ref 5–34)
Albumin: 3.1 g/dL — ABNORMAL LOW (ref 3.5–5.0)
Alkaline Phosphatase: 97 U/L (ref 40–150)
BUN: 16.3 mg/dL (ref 7.0–26.0)
Potassium: 4.2 mEq/L (ref 3.5–5.1)

## 2012-09-15 LAB — CBC WITH DIFFERENTIAL/PLATELET
BASO%: 1.4 % (ref 0.0–2.0)
Basophils Absolute: 0 10*3/uL (ref 0.0–0.1)
EOS%: 5.5 % (ref 0.0–7.0)
MCH: 31.3 pg (ref 27.2–33.4)
MCHC: 33.9 g/dL (ref 32.0–36.0)
MCV: 92.3 fL (ref 79.3–98.0)
MONO%: 5.7 % (ref 0.0–14.0)
RBC: 3.37 10*6/uL — ABNORMAL LOW (ref 4.20–5.82)
RDW: 18.4 % — ABNORMAL HIGH (ref 11.0–14.6)

## 2012-09-15 NOTE — Progress Notes (Signed)
09/14/12 Met briefly with patient when he was in the cancer center this morning for lab work. Reviewed his smoking history. He reports starting smoking when he was 52 years old. He reports stopping last fall after he was diagnosed. He also reports he smoked 1 ppd.

## 2012-09-22 ENCOUNTER — Other Ambulatory Visit: Payer: Self-pay | Admitting: *Deleted

## 2012-09-22 ENCOUNTER — Ambulatory Visit: Payer: Medicaid Other

## 2012-09-22 ENCOUNTER — Other Ambulatory Visit: Payer: Medicaid Other | Admitting: Lab

## 2012-09-22 ENCOUNTER — Other Ambulatory Visit: Payer: Medicaid Other

## 2012-09-23 ENCOUNTER — Telehealth: Payer: Self-pay | Admitting: Internal Medicine

## 2012-09-23 ENCOUNTER — Ambulatory Visit: Payer: Medicaid Other

## 2012-09-23 NOTE — Telephone Encounter (Signed)
Per 7/15 pof r/s 7/15 lb appt to 7/16. S/w wife and pt unable to come today. appt r/s for 7/17. Also ct scan had been on schedule for the day after lb/fu. Moved ct to 7/21 the day before lb/fu 7/22 and added tx and inj. S/w wife she is aware and was given new instructions for ct. S[place]w [place] Son in [place]. Wife will get new schedule tomorrow.

## 2012-09-24 ENCOUNTER — Other Ambulatory Visit (HOSPITAL_BASED_OUTPATIENT_CLINIC_OR_DEPARTMENT_OTHER): Payer: PRIVATE HEALTH INSURANCE

## 2012-09-24 DIAGNOSIS — C3490 Malignant neoplasm of unspecified part of unspecified bronchus or lung: Secondary | ICD-10-CM

## 2012-09-24 LAB — COMPREHENSIVE METABOLIC PANEL (CC13)
Albumin: 3 g/dL — ABNORMAL LOW (ref 3.5–5.0)
Alkaline Phosphatase: 107 U/L (ref 40–150)
BUN: 14.3 mg/dL (ref 7.0–26.0)
Calcium: 9.5 mg/dL (ref 8.4–10.4)
Chloride: 104 mEq/L (ref 98–109)
Glucose: 90 mg/dl (ref 70–140)
Potassium: 4.3 mEq/L (ref 3.5–5.1)
Sodium: 137 mEq/L (ref 136–145)
Total Protein: 7.5 g/dL (ref 6.4–8.3)

## 2012-09-24 LAB — CBC WITH DIFFERENTIAL/PLATELET
Basophils Absolute: 0 10*3/uL (ref 0.0–0.1)
EOS%: 1.3 % (ref 0.0–7.0)
HCT: 31.9 % — ABNORMAL LOW (ref 38.4–49.9)
HGB: 10.8 g/dL — ABNORMAL LOW (ref 13.0–17.1)
MCH: 31.4 pg (ref 27.2–33.4)
MCV: 93.1 fL (ref 79.3–98.0)
MONO%: 11.1 % (ref 0.0–14.0)
NEUT%: 73 % (ref 39.0–75.0)
RDW: 19.1 % — ABNORMAL HIGH (ref 11.0–14.6)

## 2012-09-28 ENCOUNTER — Ambulatory Visit (HOSPITAL_COMMUNITY)
Admission: RE | Admit: 2012-09-28 | Discharge: 2012-09-28 | Disposition: A | Discharge status: Home / Self Care | Payer: Medicaid Other | Source: Ambulatory Visit | Attending: Internal Medicine | Admitting: Internal Medicine

## 2012-09-28 ENCOUNTER — Encounter (HOSPITAL_COMMUNITY): Payer: Self-pay

## 2012-09-28 DIAGNOSIS — C7970 Secondary malignant neoplasm of unspecified adrenal gland: Secondary | ICD-10-CM | POA: Insufficient documentation

## 2012-09-28 DIAGNOSIS — C787 Secondary malignant neoplasm of liver and intrahepatic bile duct: Secondary | ICD-10-CM | POA: Insufficient documentation

## 2012-09-28 DIAGNOSIS — Z923 Personal history of irradiation: Secondary | ICD-10-CM | POA: Insufficient documentation

## 2012-09-28 DIAGNOSIS — Z9221 Personal history of antineoplastic chemotherapy: Secondary | ICD-10-CM | POA: Insufficient documentation

## 2012-09-28 DIAGNOSIS — C3491 Malignant neoplasm of unspecified part of right bronchus or lung: Secondary | ICD-10-CM

## 2012-09-28 DIAGNOSIS — C3490 Malignant neoplasm of unspecified part of unspecified bronchus or lung: Secondary | ICD-10-CM | POA: Insufficient documentation

## 2012-09-28 MED ORDER — IOHEXOL 300 MG/ML  SOLN
100.0000 mL | Freq: Once | INTRAMUSCULAR | Status: AC | PRN
  Administered 2012-09-28: 100 mL via INTRAVENOUS

## 2012-09-29 ENCOUNTER — Encounter: Payer: Self-pay | Admitting: Medical Oncology

## 2012-09-29 ENCOUNTER — Other Ambulatory Visit (HOSPITAL_BASED_OUTPATIENT_CLINIC_OR_DEPARTMENT_OTHER): Payer: PRIVATE HEALTH INSURANCE | Admitting: Lab

## 2012-09-29 ENCOUNTER — Ambulatory Visit: Payer: PRIVATE HEALTH INSURANCE

## 2012-09-29 ENCOUNTER — Encounter: Payer: Self-pay | Admitting: Internal Medicine

## 2012-09-29 ENCOUNTER — Ambulatory Visit (HOSPITAL_BASED_OUTPATIENT_CLINIC_OR_DEPARTMENT_OTHER): Payer: PRIVATE HEALTH INSURANCE | Admitting: Internal Medicine

## 2012-09-29 ENCOUNTER — Encounter: Payer: Self-pay | Admitting: *Deleted

## 2012-09-29 ENCOUNTER — Other Ambulatory Visit: Payer: Medicaid Other

## 2012-09-29 DIAGNOSIS — C3490 Malignant neoplasm of unspecified part of unspecified bronchus or lung: Secondary | ICD-10-CM

## 2012-09-29 DIAGNOSIS — C3491 Malignant neoplasm of unspecified part of right bronchus or lung: Secondary | ICD-10-CM

## 2012-09-29 LAB — CBC WITH DIFFERENTIAL/PLATELET
EOS%: 1.3 % (ref 0.0–7.0)
Eosinophils Absolute: 0.1 10*3/uL (ref 0.0–0.5)
MCH: 31.9 pg (ref 27.2–33.4)
MCV: 93.5 fL (ref 79.3–98.0)
MONO%: 11.6 % (ref 0.0–14.0)
NEUT#: 2.7 10*3/uL (ref 1.5–6.5)
RBC: 3.56 10*6/uL — ABNORMAL LOW (ref 4.20–5.82)
RDW: 20.1 % — ABNORMAL HIGH (ref 11.0–14.6)
lymph#: 0.7 10*3/uL — ABNORMAL LOW (ref 0.9–3.3)

## 2012-09-29 LAB — COMPREHENSIVE METABOLIC PANEL (CC13)
AST: 15 U/L (ref 5–34)
Albumin: 3.2 g/dL — ABNORMAL LOW (ref 3.5–5.0)
Alkaline Phosphatase: 106 U/L (ref 40–150)
Chloride: 102 mEq/L (ref 98–109)
Potassium: 4 mEq/L (ref 3.5–5.1)
Sodium: 138 mEq/L (ref 136–145)
Total Protein: 7.9 g/dL (ref 6.4–8.3)

## 2012-09-29 MED ORDER — OXYCODONE HCL 10 MG PO TABS: 10.0000 mg | ORAL_TABLET | Freq: Four times a day (QID) | ORAL | Status: DC | PRN

## 2012-09-29 MED ORDER — BENZONATATE 200 MG PO CAPS: 200.0000 mg | ORAL_CAPSULE | Freq: Three times a day (TID) | ORAL | Status: DC | PRN

## 2012-09-29 MED ORDER — MORPHINE SULFATE ER 60 MG PO TBCR: 60.0000 mg | EXTENDED_RELEASE_TABLET | Freq: Two times a day (BID) | ORAL | Status: DC

## 2012-09-29 NOTE — Progress Notes (Signed)
[facility] Telephone:([phone]   [phone]) [phone]  OFFICE PROGRESS NOTE  No PCP Per Patient [address] [place] [address]  DIAGNOSIS: Metastatic non-small cell lung cancer, initially diagnosed as Stage IIB/IIIA, adenocarcinoma with a large left perihilar mass and questionable mediastinal lymphadenopathy diagnosed in July of 2013   PRIOR THERAPY:  1) Neoadjuvant chemotherapy with cisplatin 75 mg/M2 and Alimta 500 mg/M2 given every 3 weeks. The patient is status post 3 cycles.  2) Concurrent chemoradiation with weekly carboplatin for AUC of 2 and paclitaxel 45 mg/M2, last dose was given 03/30/2012 with partial response.  3) Systemic chemotherapy with carboplatin for AUC of 5, paclitaxel 175 mg/M2 and Avastin 15 mg/kg with Neulasta support every 3 weeks. Status post 3 cycles, discontinued today secondary to disease progression.  CURRENT THERAPY: None.   INTERVAL HISTORY: Roger Mckinney 52 y.o. male returns to the clinic today for followup visit accompanied his wife. The patient continues to complain of fatigue and weakness as well as pain on the right side of his chest. He is currently on MS Contin 60 mg by mouth every 12 hours in addition to oxycodone 10 mg by mouth every 4-6 hours as needed for pain. He tolerated the last cycle of his systemic chemotherapy with carboplatin, paclitaxel and Avastin fairly well. He denied having any significant nausea or vomiting. He denied having any fever or chills. The patient had repeat CT scan of the chest, abdomen and pelvis performed recently and he is here for evaluation and discussion of his scan results.   MEDICAL HISTORY: Past Medical History  Diagnosis Date  . PNA (pneumonia)   . Lung mass   . Mitral valve prolapse   . Asthma     as a child  . GERD (gastroesophageal reflux disease)     hx of   . HPV (human papilloma virus) infection     hx of  . Cancer 2013  . Dental disease 2013  September    lower  teeth removed.   bone graft   . History of chemotherapy     carboplatin/taxol    ALLERGIES:  is allergic to hydrocodone-homatropine and penicillins.  MEDICATIONS:  Current Outpatient Prescriptions  Medication Sig Dispense Refill  . benzonatate (TESSALON) 200 MG capsule Take 1 capsule (200 mg total) by mouth 3 (three) times daily as needed. For cough  30 capsule  1  . diphenhydrAMINE (BENADRYL) 25 MG tablet Take 25 mg by mouth every 6 (six) hours as needed for itching.      . docusate sodium (COLACE) 100 MG capsule Take 100 mg by mouth 2 (two) times daily.      Marland Kitchen loratadine (CLARITIN) 10 MG tablet Take 10 mg by mouth daily.      Marland Kitchen morphine (MS CONTIN) 60 MG 12 hr tablet Take 1 tablet (60 mg total) by mouth 2 (two) times daily.  60 tablet  0  . omeprazole (PRILOSEC) 40 MG capsule Take 40 mg by mouth daily.      . Oxycodone HCl 10 MG TABS Take by mouth every 6 (six) hours as needed.      Marland Kitchen PRESCRIPTION MEDICATION Inject into the vein every 21 ( twenty-one) days. Taxol, Paraplatin and Avastin infusion q3weeks.      . prochlorperazine (COMPAZINE) 10 MG tablet Take 10 mg by mouth every 6 (six) hours as needed (for nausea).      Marland Kitchen HYDROmorphone (DILAUDID) 4 MG tablet Take 1 tablet (4  mg total) by mouth every 4 (four) hours as needed for pain.  30 tablet  0   No current facility-administered medications for this visit.   Facility-Administered Medications Ordered in Other Visits  Medication Dose Route Frequency Provider Last Rate Last Dose  . topical emolient (BIAFINE) emulsion   Topical Daily Billie Lade, MD        SURGICAL HISTORY:  Past Surgical History  Procedure Laterality Date  . Appendectomy    . Tonsillectomy    . Video bronchoscopy with endobronchial ultrasound  01/14/2012    Procedure: VIDEO BRONCHOSCOPY WITH ENDOBRONCHIAL ULTRASOUND;  Surgeon: Delight Ovens, MD;  Location: [facility] OR;  Service: Thoracic;  Laterality: N/A;  . Mediastinoscopy  01/14/2012    Procedure:  MEDIASTINOSCOPY;  Surgeon: Delight Ovens, MD;  Location: [facility] OR;  Service: Thoracic;  Laterality: N/A;    REVIEW OF SYSTEMS:  A comprehensive review of systems was negative except for: Constitutional: positive for fatigue Respiratory: positive for cough and wheezing Musculoskeletal: positive for bone pain and muscle weakness   PHYSICAL EXAMINATION: General appearance: alert, cooperative, fatigued and no distress Head: Normocephalic, without obvious abnormality, atraumatic Neck: no adenopathy Lymph nodes: Cervical, supraclavicular, and axillary nodes normal. Resp: clear to auscultation bilaterally Cardio: regular rate and rhythm, S1, S2 normal, no murmur, click, rub or gallop GI: soft, non-tender; bowel sounds normal; no masses,  no organomegaly Extremities: extremities normal, atraumatic, no cyanosis or edema Neurologic: Alert and oriented X 3, normal strength and tone. Normal symmetric reflexes. Normal coordination and gait  ECOG PERFORMANCE STATUS: 2 - Symptomatic, <50% confined to bed  Blood pressure 127/81, pulse 102, temperature 98.3 F (36.8 C), temperature source Oral, resp. rate 18, height 5\' 10"  (1.778 m), weight 197 lb 14.4 oz (89.767 kg).  LABORATORY DATA: Lab Results  Component Value Date   WBC 4.0 09/29/2012   HGB 11.3* 09/29/2012   HCT 33.2* 09/29/2012   MCV 93.5 09/29/2012   PLT 168 09/29/2012      Chemistry      Component Value Date/Time   NA 138 09/29/2012 0917   NA 135 06/14/2012 2012   K 4.0 09/29/2012 0917   K 4.2 06/14/2012 2012   CL 102 09/01/2012 0820   CL 99 06/14/2012 2012   CO2 25 09/29/2012 0917   CO2 24 06/14/2012 2012   BUN 13.2 09/29/2012 0917   BUN 15 06/14/2012 2012   CREATININE 1.1 09/29/2012 0917   CREATININE 0.95 06/14/2012 2012      Component Value Date/Time   CALCIUM 10.1 09/29/2012 0917   CALCIUM 9.6 06/14/2012 2012   ALKPHOS 106 09/29/2012 0917   ALKPHOS 111 01/13/2012 0849   AST 15 09/29/2012 0917   AST 17 01/13/2012 0849   ALT 11 09/29/2012 0917    ALT 9 01/13/2012 0849   BILITOT 0.68 09/29/2012 0917   BILITOT 0.6 01/13/2012 0849       RADIOGRAPHIC STUDIES: Ct Chest W Contrast  09/28/2012   *RADIOLOGY REPORT*  Clinical Data:  Lung cancer diagnosed 2013.  Chemotherapy and radiation therapy complete.  CT CHEST, ABDOMEN AND PELVIS WITH CONTRAST  Technique:  Multidetector CT imaging of the chest, abdomen and pelvis was performed following the standard protocol during bolus administration of intravenous contrast.  Contrast: OMNIPAQUE IOHEXOL 300 MG/ML  SOLN  Comparison:  PET CT scan 06/26/2012, CT thorax 06/14/2012, PET CT 01/01/2012    CT CHEST  Findings:  No axillary or supra and fibular lymphadenopathy.  No mediastinal or hilar lymphadenopathy.  No pericardial fluid.  No mediastinal lymphadenopathy.  No retrocrural adenopathy.  No retroperitoneal or periportal lymphadenopathy.  No free fluid in the pelvis.  The bladder and prostate gland are normal.  No pelvic lymphadenopathy.  Review of  bone windows demonstrates no aggressive osseous lesions.  There is a cavitary lesion in the superior segment of the left lower lobe measuring 3.6 x 2.7 cm which compares to 3.8 x 4.8 cm on prior.  Lesion now has central cavitation.  Radiation change within the left upper lobe and lower lobe unchanged from prior.  There is small 6 mm left upper lobe nodule (image 23) which is not significantly changed.  Fine interstitial pattern in the right lower lobe also likely relates to radiation.  However there are thoracotomy defects at the same level.  IMPRESSION:  1.  Some contraction of the left lower lobe mass which now has central cavitation. 2.  Radiation change in the right and left lung.    CT ABDOMEN AND PELVIS  Findings:  There are several new hypodensities within the right hepatic lobe consistent with metastasis.  Approximate five lesions ranging in size from 10-19 mm.  Exemplary lesions include 16 and 11 mm lesion on image 69 and a 15 mm lesion on image 53.   The gallbladder, pancreas, spleen, and the right adrenal gland normal.  There is enlargement of the left adrenal gland similar to comparison PET CT measuring 30 mm compared to 27 mm.  The stomach, small bowel, and colon unremarkable.  No retroperitoneal periportal lymphadenopathy.  The bladder prostate gland normal.  No pelvic lymphadenopathy. Review of  bone windows demonstrates no aggressive osseous lesions.  IMPRESSION:  1.  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 2.  Stable mildly enlarged left adrenal metastasis.   Original Report Authenticated By: Genevive Bi, M.D.    ASSESSMENT AND PLAN: This is a very pleasant 52 years old white male with metastatic non-small cell lung cancer most recently treated with 3 cycles of systemic chemotherapy with carboplatin, paclitaxel and Avastin but unfortunately continues to have evidence for disease progression especially in the liver. He has improvement in his disease in the lung. I have a lengthy discussion with the patient and his wife today about his current disease status and treatment options. I gave the patient several options for his treatment including treatment with Tarceva, docetaxel versus palliative care and hospice. I discussed adverse effect of this treatment with the patient. He would like to take some time off to think about his options and he would call me with his decision. For pain management the patient was given refills of MS Contin as well as oxycodone. For cough he was given a refill of Tessalon Perles. The patient is not given a followup appointment but he would come in the next 2 weeks with his final decision regarding whether to proceed with treatment to consider palliative care and hospice. He was advised to call immediately if he has any concerning symptoms in the interval  All questions were answered. The patient knows to call the clinic with any problems, questions or concerns. We can certainly see the patient much  sooner if necessary.  I spent 20 minutes counseling the patient face to face. The total time spent in the appointment was 30 minutes.

## 2012-09-29 NOTE — Patient Instructions (Signed)
Unfortunately your scan showed evidence for disease progression especially in the liver. We discussed treatment options including oral Tarceva, intravenous docetaxel or palliative care. He requested few weeks break off treatment and to think about her options.

## 2012-09-29 NOTE — Progress Notes (Signed)
09/29/12 Mr. Roes is in the cancer center today for lab work and /MD appointment. PRO were completed by him independently before his MD appointment.

## 2012-09-30 ENCOUNTER — Ambulatory Visit: Payer: PRIVATE HEALTH INSURANCE

## 2012-09-30 ENCOUNTER — Other Ambulatory Visit: Payer: Self-pay | Admitting: Medical Oncology

## 2012-09-30 ENCOUNTER — Ambulatory Visit (HOSPITAL_COMMUNITY): Payer: PRIVATE HEALTH INSURANCE

## 2012-09-30 NOTE — Telephone Encounter (Signed)
Wife requests refill for valium 5 mg every 4 hours prn . Dr Garen Grams prescribed #20 on 6/11. [name] said it helps him rest in between pain med. Will Dr Arbutus Ped authorize refill? Question sent to dr Arbutus Ped.

## 2012-10-01 ENCOUNTER — Telehealth: Payer: Self-pay | Admitting: Medical Oncology

## 2012-10-02 NOTE — Telephone Encounter (Signed)
Better to get it from Dr. Garen Grams but I can order Ativan instead. ----- Message ----- From: Charma Igo, RN Sent: 09/30/2012 4:40 PM To: Si Gaul, MD Wife requests refill for valium 5 mg every 4 hours prn . Dr Garen Grams prescribed #20 on 6/11. [name] said it helps him rest in between pain med. Will Dr Arbutus Ped authorize refill?  I left message about this on Cynthia's cell phone.

## 2012-10-06 ENCOUNTER — Other Ambulatory Visit: Payer: Medicaid Other | Admitting: Lab

## 2012-10-06 ENCOUNTER — Telehealth: Payer: Self-pay | Admitting: *Deleted

## 2012-10-06 NOTE — Telephone Encounter (Signed)
Hospice called in for pt.  Pt's wife called wanting to see if Dr Donnald Garre could see pt regarding what appears to be cellulitis in his foot.  Informed her that hospice will come and evaluate him at the home and oversee his medical needs.  She verbalized understanding.  SLJ

## 2012-10-07 ENCOUNTER — Telehealth: Payer: Self-pay | Admitting: *Deleted

## 2012-10-07 DIAGNOSIS — C3490 Malignant neoplasm of unspecified part of unspecified bronchus or lung: Secondary | ICD-10-CM

## 2012-10-07 MED ORDER — POLYETHYLENE GLYCOL 3350 POWD: 17.0000 g | Freq: Every day | Status: AC

## 2012-10-07 NOTE — Telephone Encounter (Signed)
Larita Fife with Hospice is calling to clarify that patient understands that with Hospice he will not be receiving further treatment.  Discussed with Kathlee Nations RN:  Yes, patient understands this and is fine with this.   Called Lynn back.  They are going out at 10:30 am this morning and she appreciated the prompt call back.

## 2012-10-07 NOTE — Telephone Encounter (Signed)
Verbal order received and read back from [employer] PA-C for Hospice to mark and continue to monitor the demarcation/discoloration and to call if it progresses.  Patient needs to take Miralax daily.  Called [place] with these orders.  [facility] from [facility]

## 2012-10-07 NOTE — Telephone Encounter (Signed)
Patient admitted today to [facility]osp[facility].  Roger Mckinney reports he has been evaluated for evidence of cellulitis.  There is a faint area of mild erythema to his rt. Lateral foot.  Was on Septra for 7 to 10 days for cellulitis six weeks ago that cleared.  This area just looks a different color.  Does Dr. Arbutus Ped want him to start an antibiotic or does he want the nurse to continue to go out to assess?  Secondly, his last BM was four days ago.  Takes colace three times a day.  Could we get senna-S and perhaps M.O.M.  Takes MS Contin and Oxycodone.  Currently using the [facility] so any orders need to be called to [facility]. [phone].  Will notify providers

## 2012-10-07 NOTE — Addendum Note (Signed)
Addended by: Augusto Garbe on: 10/07/2012 01:26 PM   Modules accepted: Orders

## 2012-10-13 ENCOUNTER — Other Ambulatory Visit: Payer: Medicaid Other | Admitting: Lab

## 2012-10-14 ENCOUNTER — Ambulatory Visit: Payer: Medicaid Other

## 2012-10-16 ENCOUNTER — Telehealth: Payer: Self-pay | Admitting: *Deleted

## 2012-10-16 NOTE — Telephone Encounter (Signed)
Hospice called stating that pt is still wanting to pursue radiation and chemotherapy and is a full code.  Hospice would only be considered a consultation at this point unless he determines he does not want anymore treatments.  Called and spoke to pt's wife.  She states that pt was wanting to take time away from treatment to see how he felt and decide what he wants to do.  Asked [name] to keep [place] posted and if needed he can have a f/u with Dr Donnald Garre to discuss this further.    Hospice RN also noted that pt may need an increase in his long acting morphine rx because he has needed to increase his oxycodone intake.  Will forward to Dr Donnald Garre.  SLJ

## 2012-10-20 ENCOUNTER — Other Ambulatory Visit: Payer: Self-pay | Admitting: Medical Oncology

## 2012-10-20 DIAGNOSIS — C3491 Malignant neoplasm of unspecified part of right bronchus or lung: Secondary | ICD-10-CM

## 2012-10-20 MED ORDER — OXYCODONE HCL 10 MG PO TABS: 10.0000 mg | ORAL_TABLET | Freq: Four times a day (QID) | ORAL | Status: DC | PRN

## 2012-10-20 MED ORDER — MORPHINE SULFATE ER 60 MG PO TBCR: 60.0000 mg | EXTENDED_RELEASE_TABLET | Freq: Three times a day (TID) | ORAL | Status: DC

## 2012-10-20 MED ORDER — DRONABINOL 2.5 MG PO CAPS: 2.5000 mg | ORAL_CAPSULE | Freq: Two times a day (BID) | ORAL | Status: AC

## 2012-10-20 MED ORDER — BENZONATATE 200 MG PO CAPS: 200.0000 mg | ORAL_CAPSULE | Freq: Three times a day (TID) | ORAL | Status: DC | PRN

## 2012-10-20 NOTE — Telephone Encounter (Signed)
I called in tessalon perles refills and narcotics RX  locked in injection room.

## 2012-10-20 NOTE — Telephone Encounter (Signed)
Roger Mckinney at  [facility] will call wife to pick up rx for MS contin and oxycodone and these were locked in injection room.  [facility] pharmacist said I can fax marinol rx  which i did.

## 2012-10-20 NOTE — Telephone Encounter (Signed)
Clydie Braun at [facility] called to request refills and that pt says MS contin is not strong enough. She also requested an appetite stimulant , marinol. His rx go to [facility].Sent to [place] fro [place] on  oxycodone and tessalon. Note to Dr Arbutus Ped about increasing MS contin and marinol.

## 2012-10-22 ENCOUNTER — Other Ambulatory Visit: Payer: Self-pay | Admitting: *Deleted

## 2012-11-05 ENCOUNTER — Telehealth: Payer: Self-pay | Admitting: Medical Oncology

## 2012-11-05 ENCOUNTER — Encounter: Payer: Self-pay | Admitting: Internal Medicine

## 2012-11-05 NOTE — Progress Notes (Signed)
Informed [employer] to close case for pt's Avastin.  Pt is now on Hosp[facility].

## 2012-11-05 NOTE — Telephone Encounter (Signed)
Dr. Anne Fu increased  pts pain med dose in Dr Asa Lente absence. New rx Morphine 60 mg -2 tablets every 12 hours, oxycodone 10 mg -1 tablet every 4 hours prn.

## 2012-11-10 ENCOUNTER — Telehealth: Payer: Self-pay | Admitting: Medical Oncology

## 2012-11-10 NOTE — Telephone Encounter (Signed)
Roger Mckinney called and left message asking if she missed our call . She said not necessary to call back.

## 2012-11-12 ENCOUNTER — Encounter: Payer: Self-pay | Admitting: Oncology

## 2012-11-13 ENCOUNTER — Other Ambulatory Visit: Payer: Self-pay | Admitting: *Deleted

## 2012-11-13 DIAGNOSIS — C3491 Malignant neoplasm of unspecified part of right bronchus or lung: Secondary | ICD-10-CM

## 2012-11-13 MED ORDER — BENZONATATE 200 MG PO CAPS: 200.0000 mg | ORAL_CAPSULE | Freq: Three times a day (TID) | ORAL | Status: AC | PRN

## 2012-11-16 ENCOUNTER — Ambulatory Visit: Payer: Medicaid Other | Admitting: Radiation Oncology

## 2012-11-17 ENCOUNTER — Telehealth: Payer: Self-pay | Admitting: Medical Oncology

## 2012-11-17 NOTE — Telephone Encounter (Signed)
I returned Cynthia's call . She said Roger Mckinney cancelled his appt with Dr Roselind Messier -he was not able to come in.Marland Kitchen He is really having pain with the rib and she was wondering if it needs to be re-xrayed? Note to Dr Arbutus Ped.

## 2012-11-18 NOTE — Telephone Encounter (Signed)
Per Dr Arbutus Ped if pt wants to f/u with Dr Arbutus Ped about rib pain , he would be happy to see him.I notified [name] and she will talk to [place] and call us back.

## 2012-11-19 ENCOUNTER — Other Ambulatory Visit: Payer: Self-pay | Admitting: Medical Oncology

## 2012-11-19 DIAGNOSIS — F411 Generalized anxiety disorder: Secondary | ICD-10-CM

## 2012-11-19 MED ORDER — LORAZEPAM 0.5 MG PO TABS: 0.5000 mg | ORAL_TABLET | Freq: Three times a day (TID) | ORAL | Status: DC

## 2012-11-19 NOTE — Telephone Encounter (Signed)
RN requests refill for ativan. I called it in to [facility].

## 2012-11-24 ENCOUNTER — Other Ambulatory Visit: Payer: Self-pay | Admitting: *Deleted

## 2012-11-25 ENCOUNTER — Telehealth: Payer: Self-pay | Admitting: Medical Oncology

## 2012-11-25 DIAGNOSIS — F411 Generalized anxiety disorder: Secondary | ICD-10-CM

## 2012-11-25 DIAGNOSIS — C3491 Malignant neoplasm of unspecified part of right bronchus or lung: Secondary | ICD-10-CM

## 2012-11-25 MED ORDER — OXYCODONE HCL 10 MG PO TABS: 10.0000 mg | ORAL_TABLET | Freq: Four times a day (QID) | ORAL | Status: DC | PRN

## 2012-11-25 MED ORDER — MORPHINE SULFATE ER 60 MG PO TBCR: 60.0000 mg | EXTENDED_RELEASE_TABLET | Freq: Three times a day (TID) | ORAL | Status: DC

## 2012-11-25 MED ORDER — LORAZEPAM 0.5 MG PO TABS: 0.5000 mg | ORAL_TABLET | Freq: Three times a day (TID) | ORAL | Status: AC | PRN

## 2012-11-25 NOTE — Telephone Encounter (Addendum)
Benna Dunks states pt needs refill on ms contin, oxycodone and ativan. She said pt wants appt with [facility]. i called and left message for [name] to call me back . I gave her two options for appts =tomorrow at 145 or Friday at 0945

## 2012-11-26 ENCOUNTER — Telehealth: Payer: Self-pay | Admitting: Medical Oncology

## 2012-11-26 DIAGNOSIS — C3491 Malignant neoplasm of unspecified part of right bronchus or lung: Secondary | ICD-10-CM

## 2012-11-26 MED ORDER — OXYCODONE HCL 10 MG PO TABS: 10.0000 mg | ORAL_TABLET | Freq: Four times a day (QID) | ORAL | Status: AC | PRN

## 2012-11-26 MED ORDER — MORPHINE SULFATE ER 60 MG PO TBCR: 60.0000 mg | EXTENDED_RELEASE_TABLET | Freq: Three times a day (TID) | ORAL | Status: AC

## 2012-11-26 NOTE — Telephone Encounter (Signed)
Per [facility] pharmacy narcotics are a special order -they do not keep it in stock so they are requesting a quantity of #100. I told pharmacist to cancel previous order and faxed  new refill order to [facility].

## 2012-11-26 NOTE — Telephone Encounter (Signed)
I mailed hard copies of refill meds to [facility]

## 2012-11-26 NOTE — Telephone Encounter (Signed)
[name] stated that he does not want to come in for an appt. I told her that if he is concerned about his rib pain and wants to talk to Dr Arbutus Ped about it that Dr Arbutus Ped can see him . She declined appt at this time.

## 2012-12-08 ENCOUNTER — Encounter: Payer: Self-pay | Admitting: *Deleted

## 2012-12-10 ENCOUNTER — Telehealth: Payer: Self-pay | Admitting: Medical Oncology

## 2012-12-10 NOTE — Telephone Encounter (Signed)
[facility] called and said Roger Mckinney is on oxygen 2 l prn for  increased dysnea . He continues to have cough productive of clear sputum and  bilateral rib pain. They are giving him a bowel regimen to relieve his constipation-good bowel sounds . [place]  told her he wants to be a DNR and die at home. Is Dr Arbutus Ped okay with DNR . I told her Dr Arbutus Ped said yes . She will prepare form for this. Pt is scheduled to see Dr Arbutus Ped tomorrow.

## 2012-12-11 ENCOUNTER — Ambulatory Visit (HOSPITAL_BASED_OUTPATIENT_CLINIC_OR_DEPARTMENT_OTHER): Payer: PRIVATE HEALTH INSURANCE | Admitting: Internal Medicine

## 2012-12-11 ENCOUNTER — Encounter: Payer: Self-pay | Admitting: *Deleted

## 2012-12-11 ENCOUNTER — Encounter: Payer: Self-pay | Admitting: Internal Medicine

## 2012-12-11 DIAGNOSIS — C3430 Malignant neoplasm of lower lobe, unspecified bronchus or lung: Secondary | ICD-10-CM

## 2012-12-11 DIAGNOSIS — C7951 Secondary malignant neoplasm of bone: Secondary | ICD-10-CM

## 2012-12-11 DIAGNOSIS — M545 Low back pain: Secondary | ICD-10-CM

## 2012-12-11 DIAGNOSIS — R079 Chest pain, unspecified: Secondary | ICD-10-CM

## 2012-12-11 DIAGNOSIS — F411 Generalized anxiety disorder: Secondary | ICD-10-CM

## 2012-12-11 DIAGNOSIS — C785 Secondary malignant neoplasm of large intestine and rectum: Secondary | ICD-10-CM

## 2012-12-11 DIAGNOSIS — K5900 Constipation, unspecified: Secondary | ICD-10-CM

## 2012-12-11 NOTE — Progress Notes (Signed)
[facility] [phone]) [phone]   [phone]  OFFICE PROGRESS NOTE  No PCP Per Patient [address] [place] [address]  DIAGNOSIS: Metastatic non-small cell lung cancer, initially diagnosed as Stage IIB/IIIA, adenocarcinoma with a large left perihilar mass and questionable mediastinal lymphadenopathy diagnosed in July of 2013   PRIOR THERAPY:  1) Neoadjuvant chemotherapy with cisplatin 75 mg/M2 and Alimta 500 mg/M2 given every 3 weeks. The patient is status post 3 cycles.  2) Concurrent chemoradiation with weekly carboplatin for AUC of 2 and paclitaxel 45 mg/M2, last dose was given 03/30/2012 with partial response.  3) Systemic chemotherapy with carboplatin for AUC of 5, paclitaxel 175 mg/M2 and Avastin 15 mg/kg with Neulasta support every 3 weeks. Status post 2 cycles discontinued secondary to disease progression.  CURRENT THERAPY: Hospice and palliative care  INTERVAL HISTORY:  LEVEON PELZER 52 y.o. male returns to the clinic today for follow up visit accompanied by his wife. The patient requested this visit to discuss with me his current condition and to make sure that I am still involved in his care and make a decision regarding his hospice care. He is very anxious. He continues to have pain on the right side of the chest as well as lower back.he is currently on multiple pain medications including MS Contin 60 mg by mouth every 6 hours in addition to oxycodone 10 mg by mouth every 6 hours as needed for breakthrough pain, and Diluadid 4 mg every 4 hours as needed. The patient is also on Ativan for anxiety in addition to Marinol for lack of appetite. His other main complaint is constipation and he is currently on Colace and MiraLAX. He denied having any other significant complaints.   MEDICAL HISTORY: Past Medical History  Diagnosis Date  . PNA (pneumonia)   . Lung mass   . Mitral valve prolapse   . Asthma     as a child  . GERD (gastroesophageal  reflux disease)     hx of   . HPV (human papilloma virus) infection     hx of  . Cancer 2013  . Dental disease 2013  September    lower teeth removed.   bone graft   . History of chemotherapy     carboplatin/taxol  . History of radiation therapy 02/10/12-04/27/12    63 Gy to left central chest    ALLERGIES:  is allergic to hydrocodone-homatropine and penicillins.  MEDICATIONS:  Current Outpatient Prescriptions  Medication Sig Dispense Refill  . benzonatate (TESSALON) 200 MG capsule Take 1 capsule (200 mg total) by mouth 3 (three) times daily as needed. For cough  30 capsule  1  . diphenhydrAMINE (BENADRYL) 25 MG tablet Take 25 mg by mouth every 6 (six) hours as needed for itching.      . docusate sodium (COLACE) 100 MG capsule Take 100 mg by mouth 2 (two) times daily.      Marland Kitchen dronabinol (MARINOL) 2.5 MG capsule Take 1 capsule (2.5 mg total) by mouth 2 (two) times daily before a meal.  60 capsule  0  . HYDROmorphone (DILAUDID) 4 MG tablet Take 1 tablet (4 mg total) by mouth every 4 (four) hours as needed for pain.  30 tablet  0  . loratadine (CLARITIN) 10 MG tablet Take 10 mg by mouth daily.      Marland Kitchen LORazepam (ATIVAN) 0.5 MG tablet Take 1 tablet (0.5 mg total) by mouth every 8 (eight) hours as needed  for anxiety. May take 1 tablet every 8 hours as needed  by mouth or under the tongue.  30 tablet  0  . morphine (MS CONTIN) 60 MG 12 hr tablet Take 1 tablet (60 mg total) by mouth every 8 (eight) hours.  100 tablet  0  . omeprazole (PRILOSEC) 40 MG capsule Take 40 mg by mouth daily.      . Oxycodone HCl 10 MG TABS Take 1 tablet (10 mg total) by mouth every 6 (six) hours as needed.  100 tablet  0  . Polyethylene Glycol 3350 POWD Take 17 g by mouth daily.  257 g  prn  . PRESCRIPTION MEDICATION Inject into the vein every 21 ( twenty-one) days. Taxol, Paraplatin and Avastin infusion q3weeks.      . prochlorperazine (COMPAZINE) 10 MG tablet Take 10 mg by mouth every 6 (six) hours as needed (for  nausea).       No current facility-administered medications for this visit.   Facility-Administered Medications Ordered in Other Visits  Medication Dose Route Frequency Provider Last Rate Last Dose  . topical emolient (BIAFINE) emulsion   Topical Daily Billie Lade, MD        SURGICAL HISTORY:  Past Surgical History  Procedure Laterality Date  . Appendectomy    . Tonsillectomy    . Video bronchoscopy with endobronchial ultrasound  01/14/2012    Procedure: VIDEO BRONCHOSCOPY WITH ENDOBRONCHIAL ULTRASOUND;  Surgeon: Delight Ovens, MD;  Location: [facility] OR;  Service: Thoracic;  Laterality: N/A;  . Mediastinoscopy  01/14/2012    Procedure: MEDIASTINOSCOPY;  Surgeon: Delight Ovens, MD;  Location: [facility] OR;  Service: Thoracic;  Laterality: N/A;    REVIEW OF SYSTEMS:  Constitutional: positive for anorexia and fatigue Eyes: negative Ears, nose, mouth, throat, and face: negative Respiratory: positive for dyspnea on exertion and pleurisy/chest pain Cardiovascular: negative Gastrointestinal: positive for constipation Genitourinary:negative Integument/breast: negative Hematologic/lymphatic: negative Musculoskeletal:negative Neurological: negative Behavioral/Psych: negative Endocrine: negative Allergic/Immunologic: negative   PHYSICAL EXAMINATION: General appearance: alert, cooperative, fatigued and no distress Head: Normocephalic, without obvious abnormality, atraumatic Neck: no adenopathy, no JVD, supple, symmetrical, trachea midline and thyroid not enlarged, symmetric, no tenderness/mass/nodules Lymph nodes: Cervical, supraclavicular, and axillary nodes normal. Resp: clear to auscultation bilaterally Back: symmetric, no curvature. ROM normal. No CVA tenderness. Cardio: regular rate and rhythm, S1, S2 normal, no murmur, click, rub or gallop GI: soft, non-tender; bowel sounds normal; no masses,  no organomegaly Extremities: extremities normal, atraumatic, no cyanosis or  edema Neurologic: Alert and oriented X 3, normal strength and tone. Normal symmetric reflexes. Normal coordination and gait  ECOG PERFORMANCE STATUS: 2 - Symptomatic, <50% confined to bed  Blood pressure 121/74, pulse 109, temperature 97.5 F (36.4 C), temperature source Oral, resp. rate 19, height 5\' 10"  (1.778 m), weight 178 lb 9.6 oz (81.012 kg).  LABORATORY DATA: Lab Results  Component Value Date   WBC 4.0 09/29/2012   HGB 11.3* 09/29/2012   HCT 33.2* 09/29/2012   MCV 93.5 09/29/2012   PLT 168 09/29/2012      Chemistry      Component Value Date/Time   NA 138 09/29/2012 0917   NA 135 06/14/2012 2012   K 4.0 09/29/2012 0917   K 4.2 06/14/2012 2012   CL 102 09/01/2012 0820   CL 99 06/14/2012 2012   CO2 25 09/29/2012 0917   CO2 24 06/14/2012 2012   BUN 13.2 09/29/2012 0917   BUN 15 06/14/2012 2012   CREATININE 1.1 09/29/2012 6578  CREATININE 0.95 06/14/2012 2012      Component Value Date/Time   CALCIUM 10.1 09/29/2012 0917   CALCIUM 9.6 06/14/2012 2012   ALKPHOS 106 09/29/2012 0917   ALKPHOS 111 01/13/2012 0849   AST 15 09/29/2012 0917   AST 17 01/13/2012 0849   ALT 11 09/29/2012 0917   ALT 9 01/13/2012 0849   BILITOT 0.68 09/29/2012 0917   BILITOT 0.6 01/13/2012 0849       RADIOGRAPHIC STUDIES: No results found.  ASSESSMENT AND PLAN: this is a very pleasant 52 years old white male with metastatic non-small cell lung cancer status post systemic chemotherapy for several regimens but unfortunately the patient continues to have evidence for disease progression and he is currently on palliative care and hospice. I have a lengthy discussion with the patient today about his current condition. He is not interested in resuming any form of systemic chemotherapy. The patient would like to continue with the hospice care but he wanted to make sure that I am still involved in his care. I assured Mr. Bouillon that all his hospice orders and care are coming from our office. For constipation I recommended for  the patient to consider magnesium citrate on as-needed basis. The patient will continue with his current pain medication with MS Contin, Dilaudid and oxycodone. He was advised to call immediately if he has any concerning symptoms in the interval.  The patient voices understanding of current disease status and treatment options and is in agreement with the current care plan.  All questions were answered. The patient knows to call the clinic with any problems, questions or concerns. We can certainly see the patient much sooner if necessary.  I spent 15 minutes counseling the patient face to face. The total time spent in the appointment was 25 minutes.

## 2012-12-11 NOTE — Progress Notes (Signed)
12/11/12 @ 09:30 am, BMS [id number]: Questionnaires:  Roger Mckinney is a hospice patient at this time.  He came in to see Dr. Arbutus Ped regarding pain.  Spoke with him and he refused to complete the LCSS and  EQ-5D-3L questionnaires today.  He said he was having a bad day and too much pain. He said that he would try to do them next time.  Even though this visit should have included research lab samples, there were no standard of care labs ordered, therefore, no venipuncture schedule, no BMS-118 labs drawn.

## 2012-12-13 NOTE — Patient Instructions (Signed)
Continue with palliative care. Follow up visit on as-needed basis

## 2012-12-29 ENCOUNTER — Other Ambulatory Visit: Payer: Self-pay | Admitting: *Deleted

## 2013-01-09 DEATH — deceased

## 2013-01-14 ENCOUNTER — Other Ambulatory Visit: Payer: Self-pay

## 2013-01-22 IMAGING — CR DG CHEST 2V
2 series · 2 of 2 positions shown · non-contrast
Comparison: 01/01/2012, 12/23/2011.

CLINICAL DATA: Known left hilar mass

CHEST - 2 VIEW

[view not recorded (1 of 2)]
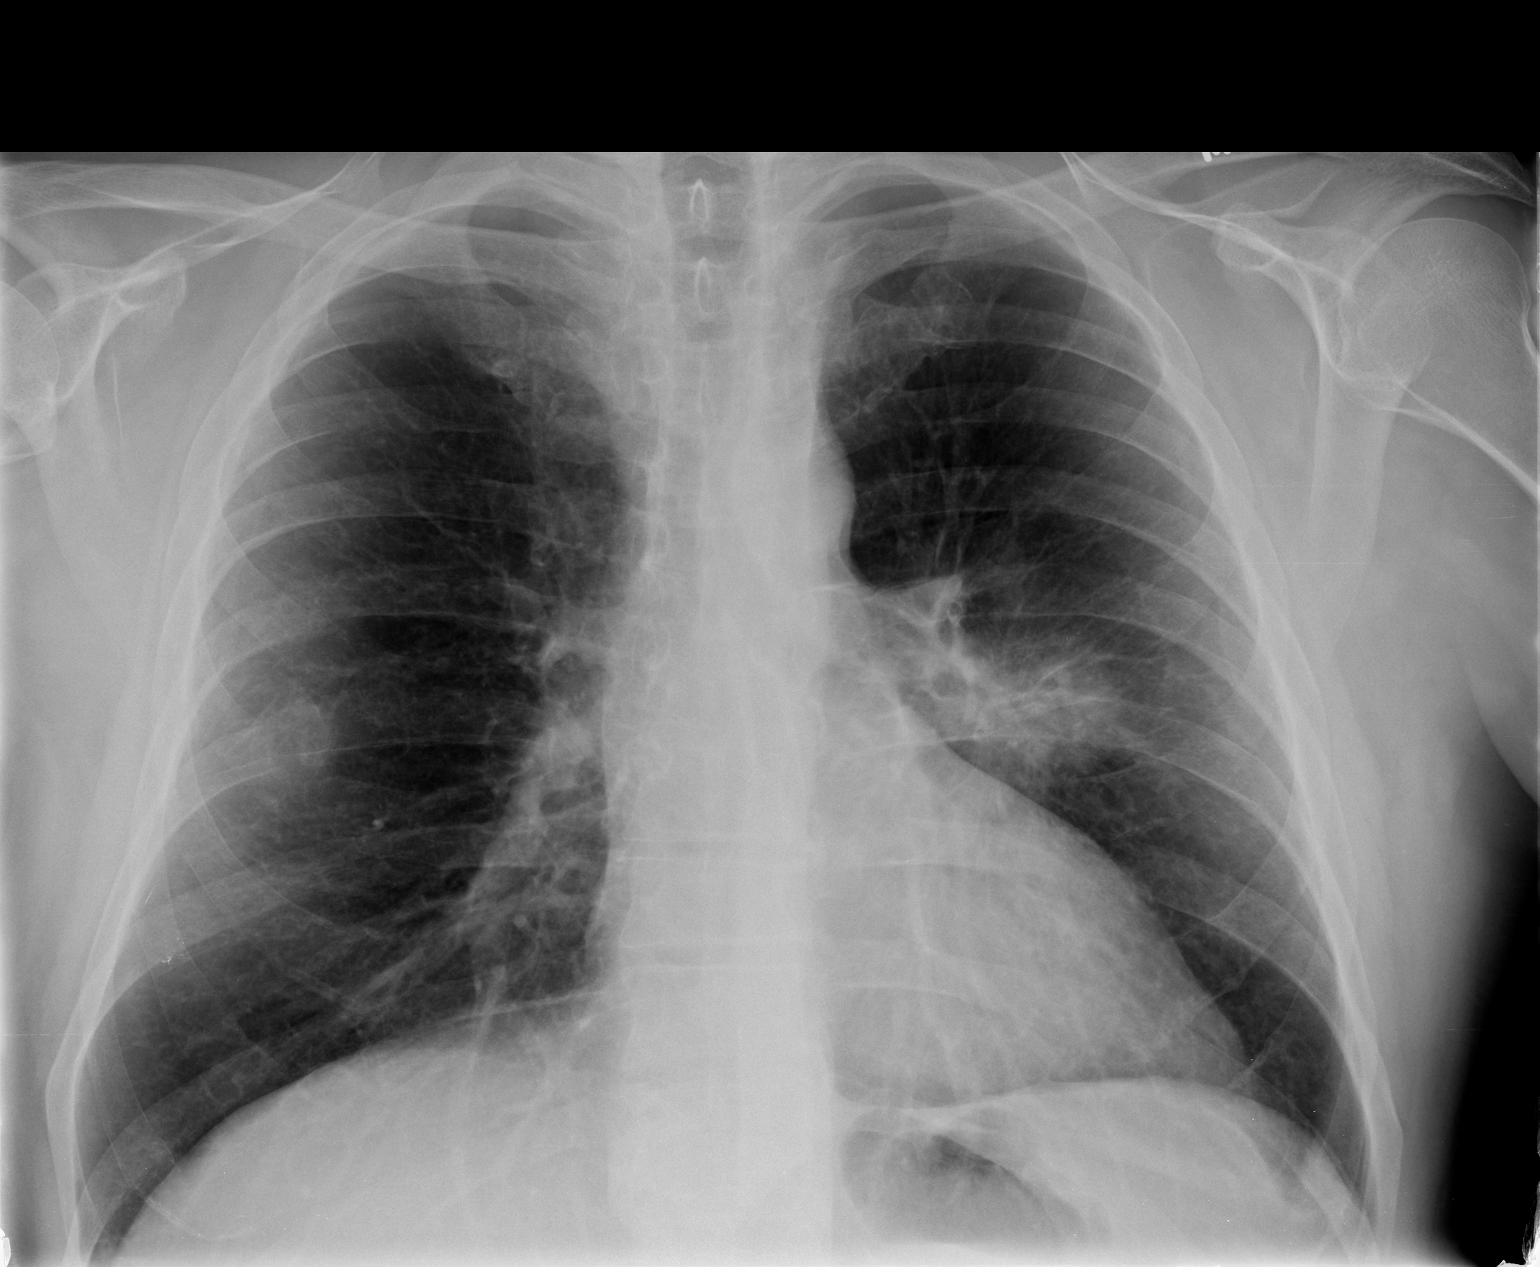

[view not recorded (2 of 2)]
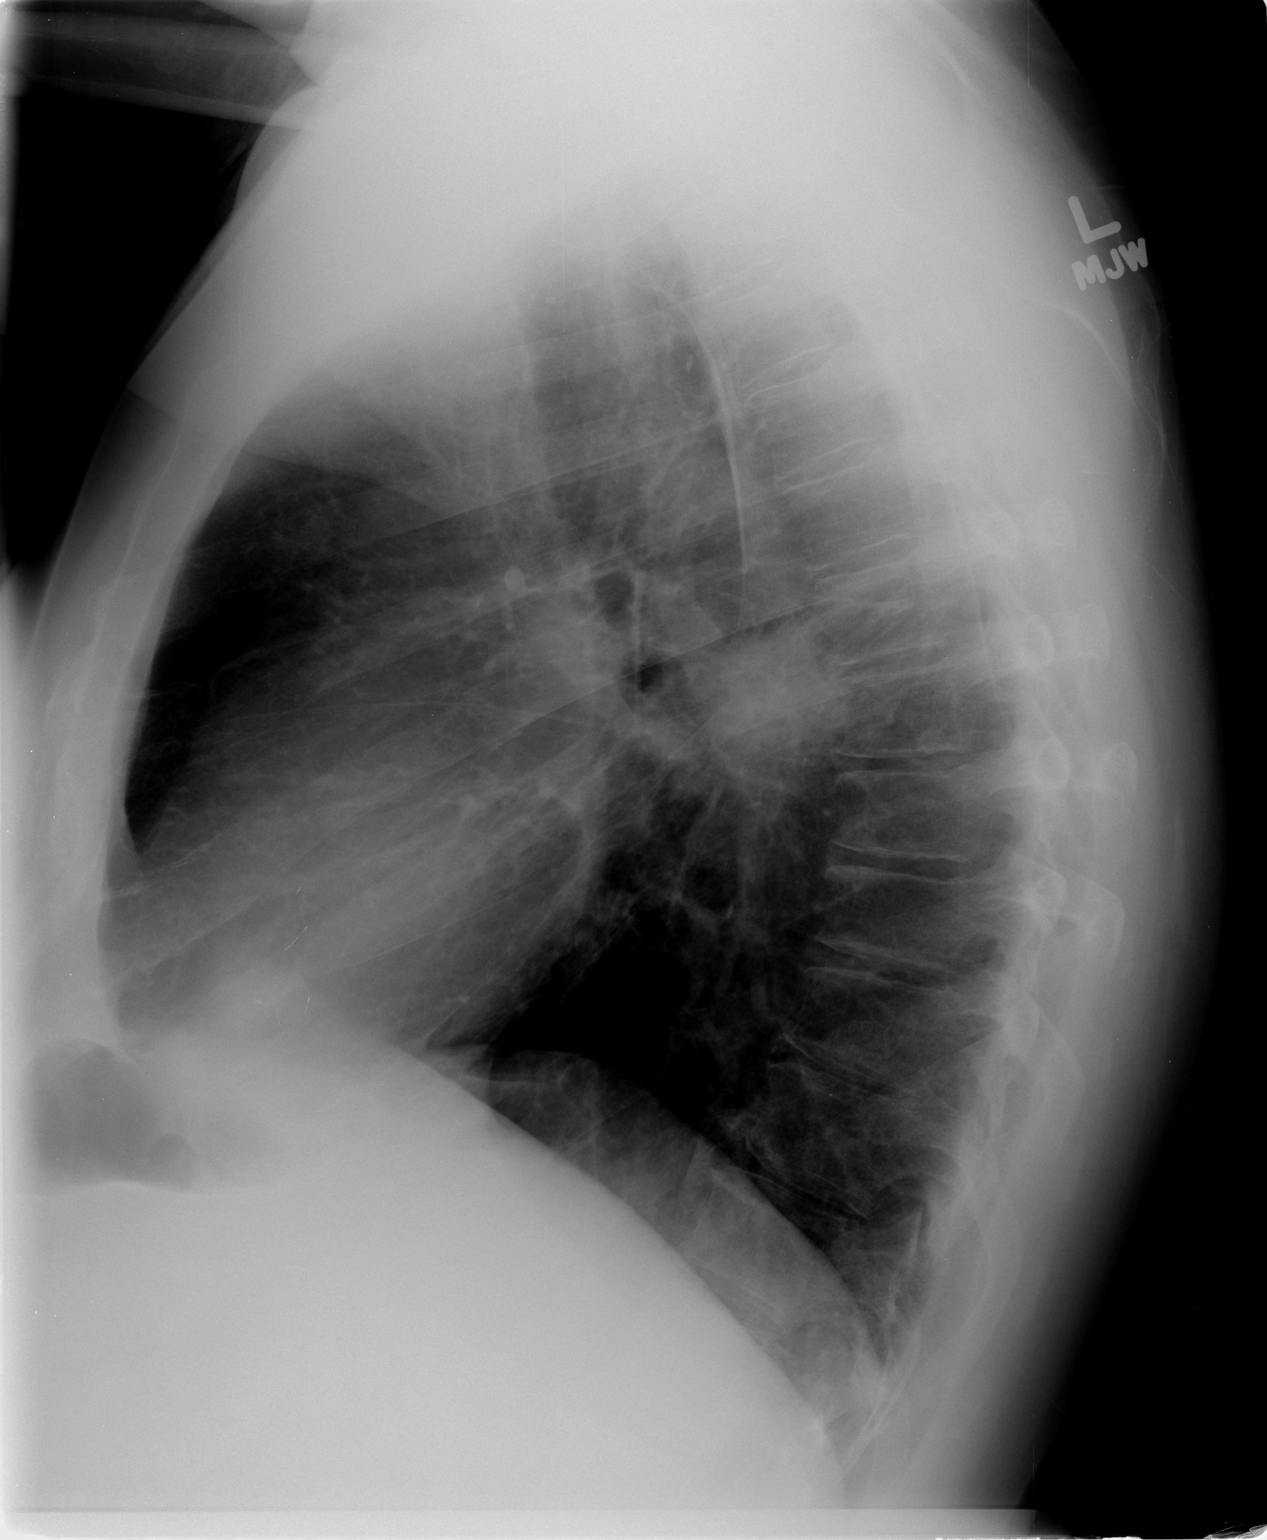

[2 of 2 positions shown; findings below may reference images not displayed]

FINDINGS: The cardiac shadow is stable.  The patients known left
hilar mass is again visualized and stable.  No evidence of
pneumothorax is seen.  There are changes consistent with a prior
rib fracture on the right without significant union.  No sizable
effusion is seen.
IMPRESSION: Stable left hilar mass.  No acute abnormality is noted.

## 2013-02-25 ENCOUNTER — Encounter: Payer: Self-pay | Admitting: *Deleted

## 2013-10-08 IMAGING — CT CT CHEST W/ CM
2 of 5 series · 16 of 46 positions shown, 18 images · IV contrast (OMNIPAQUE)
Comparison: PET CT scan 06/26/2012, CT thorax 06/14/2012, PET CT
01/01/2012

CT CHEST

CLINICAL DATA: Lung cancer diagnosed 8807.  Chemotherapy and
radiation therapy complete.

CT CHEST, ABDOMEN AND PELVIS WITH CONTRAST
TECHNIQUE: Multidetector CT imaging of the chest, abdomen and
pelvis was performed following the standard protocol during bolus
administration of intravenous contrast.
Contrast: 100mL OMNIPAQUE IOHEXOL 300 MG/ML  SOLN

[Series 2: cap with st · axial · 0.80mm/px · z∈[-660,-50]mm · 13 of 138 slices shown, 15 images]
[im 8/138  soft-tissue]
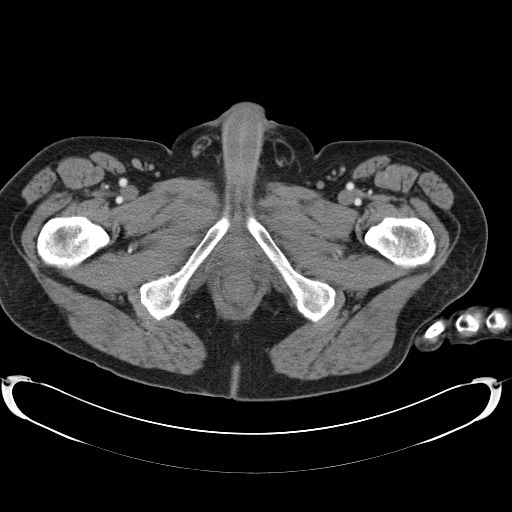
[im 8/138  bone]
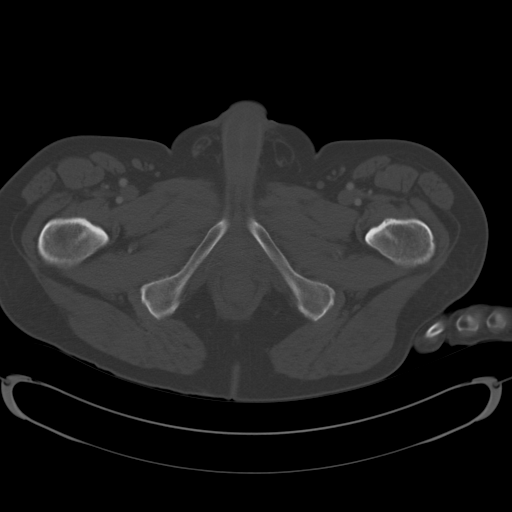
[im 16/138  soft-tissue]
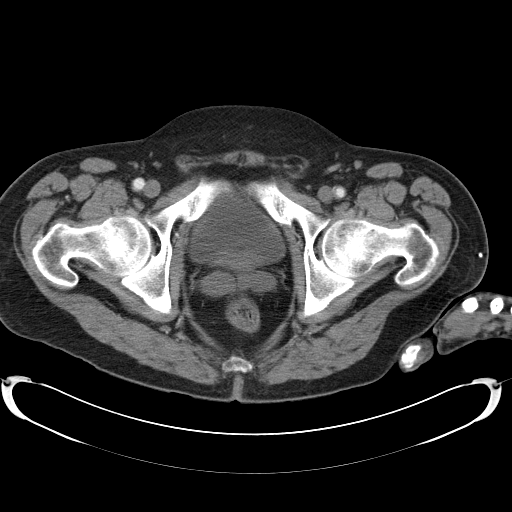
[im 31/138  soft-tissue]
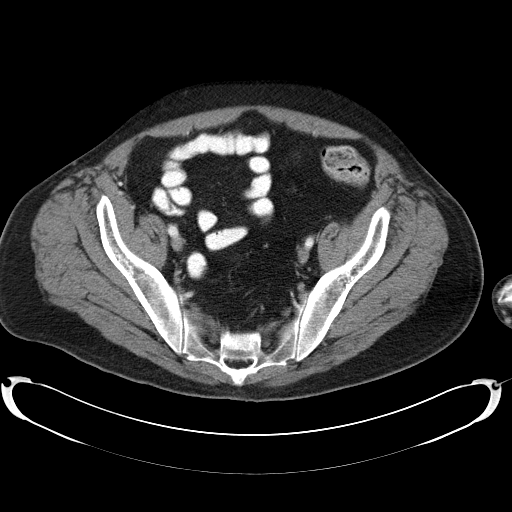
[im 39/138  soft-tissue]
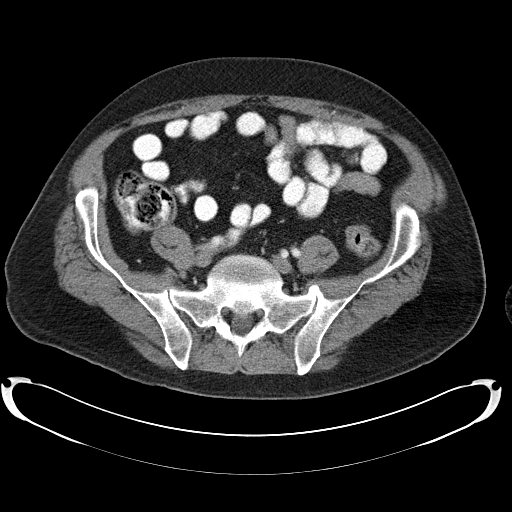
[im 46/138  soft-tissue]
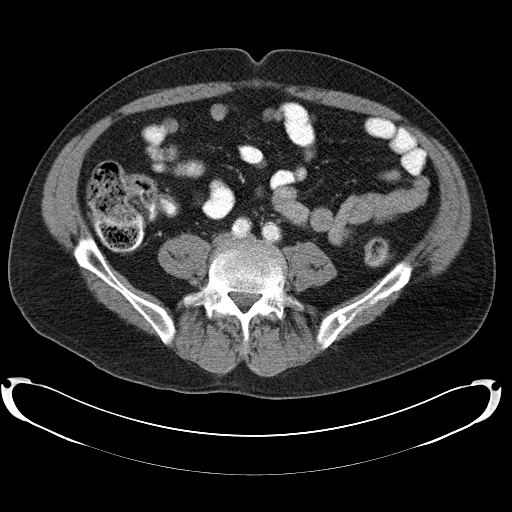
[im 61/138  soft-tissue]
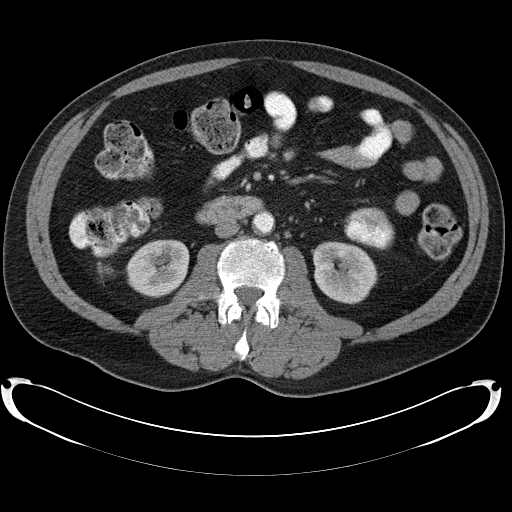
[im 69/138  soft-tissue]
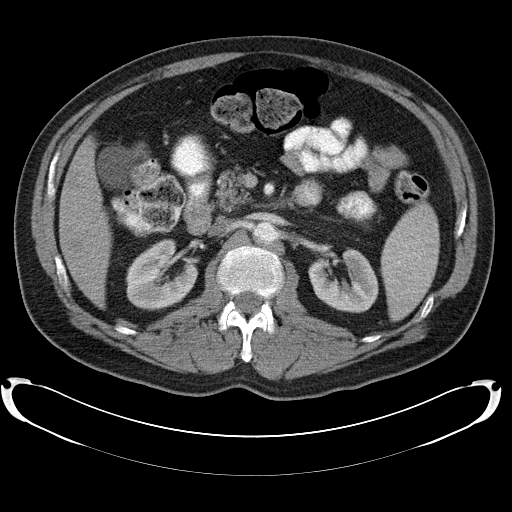
[im 77/138  soft-tissue]
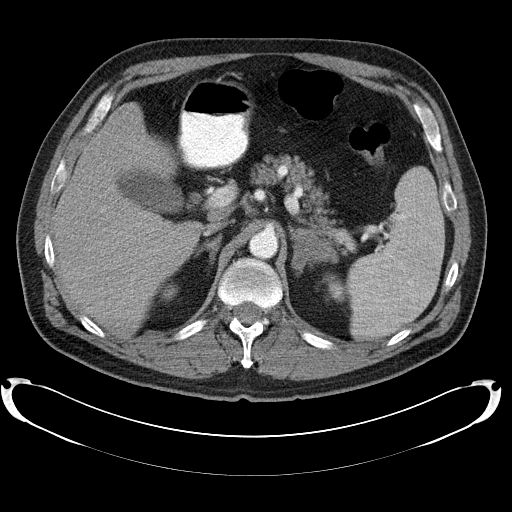
[im 92/138  soft-tissue]
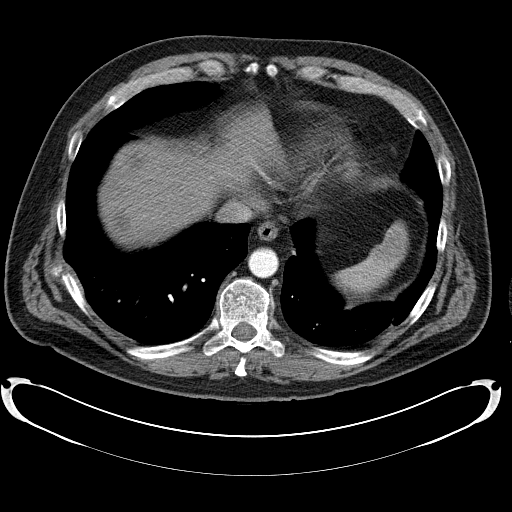
[im 92/138  bone]
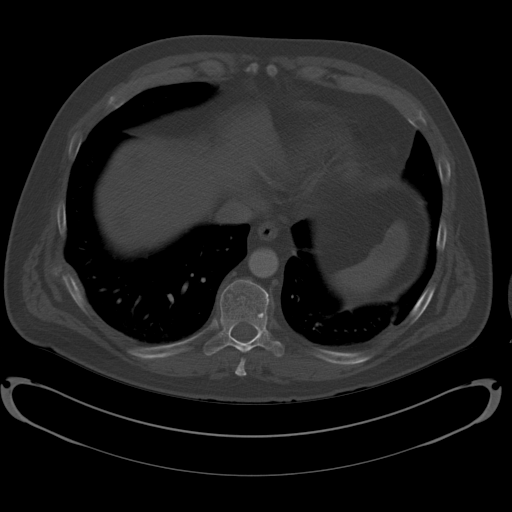
[im 99/138  soft-tissue]
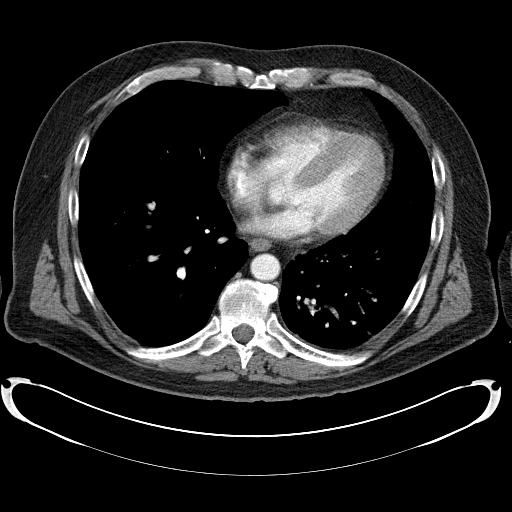
[im 107/138  soft-tissue]
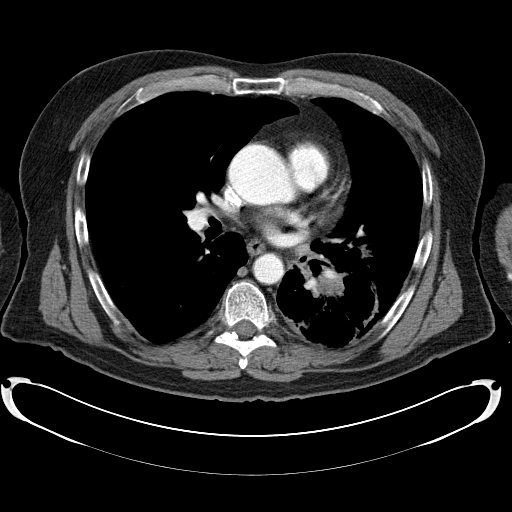
[im 122/138  soft-tissue]
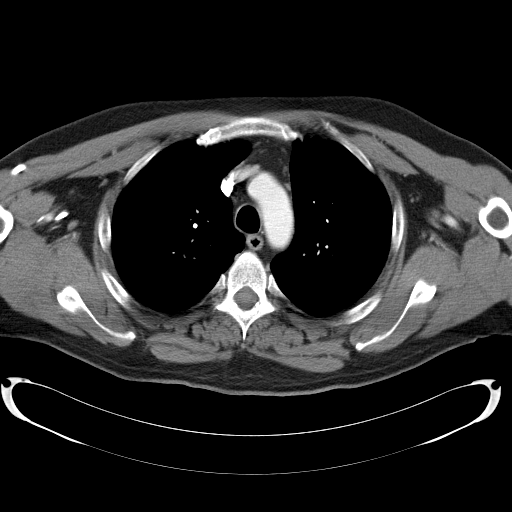
[im 130/138  soft-tissue]
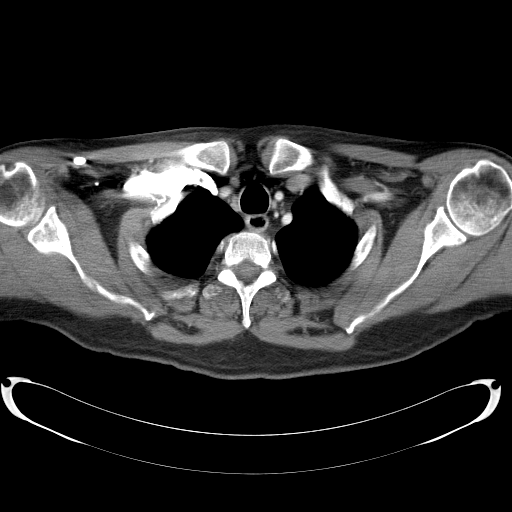

[Series 602: cor · coronal · 1.34mm/px · 3 of 97 slices shown]
[im 33/97  soft-tissue]
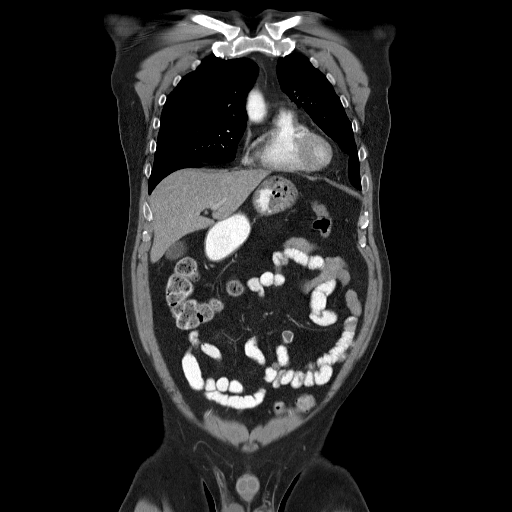
[im 43/97  soft-tissue]
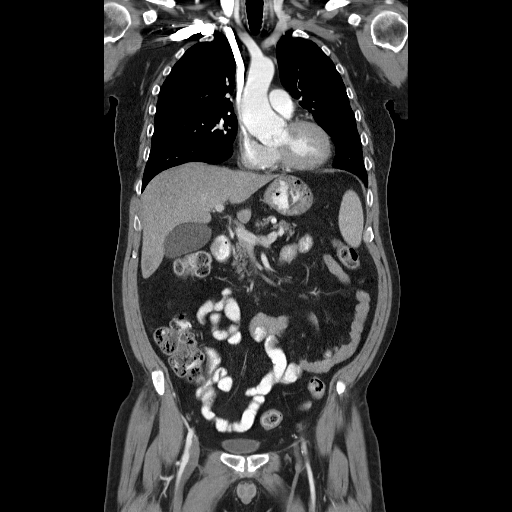
[im 54/97  soft-tissue]
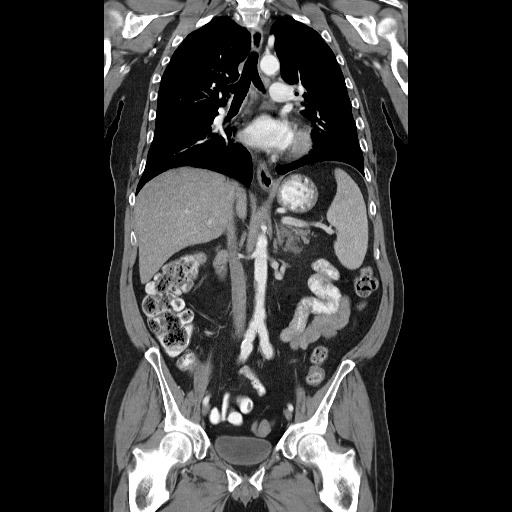

[16 of 46 positions shown; findings below may reference images not displayed]

FINDINGS: No axillary or supra and fibular lymphadenopathy.  No
mediastinal or hilar lymphadenopathy.  No pericardial fluid.  No
mediastinal lymphadenopathy.  No retrocrural adenopathy.

No retroperitoneal or periportal lymphadenopathy.

No free fluid in the pelvis.  The bladder and prostate gland are
normal.  No pelvic lymphadenopathy.  Review of  bone windows
demonstrates no aggressive osseous lesions.

There is a cavitary lesion in the superior segment of the left
lower lobe measuring 3.6 x 2.7 cm which compares to 3.8 x 4.8 cm on
prior.  Lesion now has central cavitation.  Radiation change within
the left upper lobe and lower lobe unchanged from prior.  There is
small 6 mm left upper lobe nodule (image 23) which is not
significantly changed.  Fine interstitial pattern in the right
lower lobe also likely relates to radiation.  However there are
thoracotomy defects at the same level.
IMPRESSION: 1..  Some contraction of the left lower lobe mass which now has
central cavitation.
2.  Radiation change in the right and left lung.

CT ABDOMEN AND PELVIS
FINDINGS: There are several new hypodensities within the right
hepatic lobe consistent with metastasis.  Approximate five lesions
ranging in size from 10-19 mm.  Exemplary lesions include 16 and 11
mm lesion on image 69 and a 15 mm lesion on image 53.

The gallbladder, pancreas, spleen, and the right adrenal gland
normal.  There is enlargement of the left adrenal gland similar to
comparison PET CT measuring 30 mm compared to 27 mm.  The stomach,
small bowel, and colon unremarkable.  No retroperitoneal periportal
lymphadenopathy.

The bladder prostate gland normal.  No pelvic lymphadenopathy.
Review of  bone windows demonstrates no aggressive osseous lesions.
IMPRESSION: 1..  New hepatic metastasis in the right hepatic lobe.
2.  Stable mildly enlarged left adrenal metastasis.

## 2016-03-16 ENCOUNTER — Other Ambulatory Visit: Payer: Self-pay | Admitting: Nurse Practitioner
